# Patient Record
Sex: Female | Born: 1945 | Race: White | Hispanic: No | Smoking: Former smoker
Health system: Southern US, Community
[De-identification: ages and names within clinical notes are randomized; demographics above are authoritative.]

## PROBLEM LIST (undated history)

## (undated) DIAGNOSIS — Z79899 Other long term (current) drug therapy: Secondary | ICD-10-CM

## (undated) DIAGNOSIS — E119 Type 2 diabetes mellitus without complications: Secondary | ICD-10-CM

## (undated) DIAGNOSIS — R1319 Other dysphagia: Secondary | ICD-10-CM

## (undated) DIAGNOSIS — J189 Pneumonia, unspecified organism: Secondary | ICD-10-CM

## (undated) DIAGNOSIS — E785 Hyperlipidemia, unspecified: Secondary | ICD-10-CM

## (undated) DIAGNOSIS — Z8489 Family history of other specified conditions: Secondary | ICD-10-CM

## (undated) DIAGNOSIS — K219 Gastro-esophageal reflux disease without esophagitis: Secondary | ICD-10-CM

## (undated) DIAGNOSIS — I2699 Other pulmonary embolism without acute cor pulmonale: Secondary | ICD-10-CM

## (undated) DIAGNOSIS — Z7969 Long term (current) use of other immunomodulators and immunosuppressants: Secondary | ICD-10-CM

## (undated) DIAGNOSIS — I10 Essential (primary) hypertension: Secondary | ICD-10-CM

## (undated) DIAGNOSIS — C801 Malignant (primary) neoplasm, unspecified: Secondary | ICD-10-CM

## (undated) DIAGNOSIS — Z9289 Personal history of other medical treatment: Secondary | ICD-10-CM

## (undated) DIAGNOSIS — Z789 Other specified health status: Secondary | ICD-10-CM

## (undated) DIAGNOSIS — N189 Chronic kidney disease, unspecified: Secondary | ICD-10-CM

## (undated) HISTORY — PX: BACK SURGERY: SHX140

## (undated) HISTORY — PX: APPENDECTOMY: SHX54

## (undated) HISTORY — PX: ABDOMINAL HYSTERECTOMY: SHX81

## (undated) HISTORY — DX: Malignant (primary) neoplasm, unspecified: C80.1

## (undated) HISTORY — PX: CATARACT EXTRACTION: SUR2

## (undated) HISTORY — DX: Chronic kidney disease, unspecified: N18.9

## (undated) HISTORY — DX: Other pulmonary embolism without acute cor pulmonale: I26.99

## (undated) HISTORY — DX: Other specified health status: Z78.9

## (undated) HISTORY — DX: Essential (primary) hypertension: I10

## (undated) HISTORY — PX: SPINE SURGERY: SHX786

## (undated) HISTORY — PX: TUBAL LIGATION: SHX77

## (undated) HISTORY — DX: Long term (current) use of other immunomodulators and immunosuppressants: Z79.69

## (undated) HISTORY — DX: Other dysphagia: R13.19

## (undated) HISTORY — PX: LUMBAR FUSION: SHX111

## (undated) HISTORY — DX: Other long term (current) drug therapy: Z79.899

---

## 1998-05-25 ENCOUNTER — Ambulatory Visit (HOSPITAL_BASED_OUTPATIENT_CLINIC_OR_DEPARTMENT_OTHER): Admission: RE | Admit: 1998-05-25 | Discharge: 1998-05-25 | Payer: Self-pay | Admitting: Orthopedic Surgery

## 1998-06-15 ENCOUNTER — Ambulatory Visit (HOSPITAL_BASED_OUTPATIENT_CLINIC_OR_DEPARTMENT_OTHER): Admission: RE | Admit: 1998-06-15 | Discharge: 1998-06-15 | Payer: Self-pay | Admitting: Orthopedic Surgery

## 1998-08-15 ENCOUNTER — Ambulatory Visit (HOSPITAL_BASED_OUTPATIENT_CLINIC_OR_DEPARTMENT_OTHER): Admission: RE | Admit: 1998-08-15 | Discharge: 1998-08-15 | Payer: Self-pay | Admitting: Orthopedic Surgery

## 2014-03-30 DIAGNOSIS — C801 Malignant (primary) neoplasm, unspecified: Secondary | ICD-10-CM

## 2014-03-30 HISTORY — DX: Malignant (primary) neoplasm, unspecified: C80.1

## 2014-04-07 ENCOUNTER — Telehealth: Payer: Self-pay

## 2014-04-07 ENCOUNTER — Other Ambulatory Visit: Payer: Self-pay

## 2014-04-07 DIAGNOSIS — C159 Malignant neoplasm of esophagus, unspecified: Secondary | ICD-10-CM

## 2014-04-07 NOTE — Telephone Encounter (Signed)
EUS scheduled, pt instructed and medications reviewed.  Patient instructions mailed to home.  Patient to call with any questions or concerns.  

## 2014-04-08 ENCOUNTER — Encounter (HOSPITAL_COMMUNITY)
Admission: RE | Disposition: A | Discharge status: Home / Self Care | Payer: Self-pay | Source: Ambulatory Visit | Attending: Gastroenterology

## 2014-04-08 ENCOUNTER — Ambulatory Visit (HOSPITAL_COMMUNITY): Payer: Commercial Managed Care - PPO | Admitting: *Deleted

## 2014-04-08 ENCOUNTER — Encounter (HOSPITAL_COMMUNITY): Payer: Self-pay

## 2014-04-08 ENCOUNTER — Ambulatory Visit (HOSPITAL_COMMUNITY)
Admission: RE | Admit: 2014-04-08 | Discharge: 2014-04-08 | Disposition: A | Discharge status: Home / Self Care | Payer: Commercial Managed Care - PPO | Source: Ambulatory Visit | Attending: Gastroenterology | Admitting: Gastroenterology

## 2014-04-08 ENCOUNTER — Encounter (HOSPITAL_COMMUNITY): Payer: Commercial Managed Care - PPO | Admitting: *Deleted

## 2014-04-08 DIAGNOSIS — E109 Type 1 diabetes mellitus without complications: Secondary | ICD-10-CM | POA: Insufficient documentation

## 2014-04-08 DIAGNOSIS — R1310 Dysphagia, unspecified: Secondary | ICD-10-CM | POA: Insufficient documentation

## 2014-04-08 DIAGNOSIS — C159 Malignant neoplasm of esophagus, unspecified: Secondary | ICD-10-CM

## 2014-04-08 DIAGNOSIS — Z79899 Other long term (current) drug therapy: Secondary | ICD-10-CM | POA: Insufficient documentation

## 2014-04-08 DIAGNOSIS — F17200 Nicotine dependence, unspecified, uncomplicated: Secondary | ICD-10-CM | POA: Insufficient documentation

## 2014-04-08 DIAGNOSIS — R599 Enlarged lymph nodes, unspecified: Secondary | ICD-10-CM | POA: Insufficient documentation

## 2014-04-08 DIAGNOSIS — I10 Essential (primary) hypertension: Secondary | ICD-10-CM | POA: Insufficient documentation

## 2014-04-08 DIAGNOSIS — C160 Malignant neoplasm of cardia: Secondary | ICD-10-CM | POA: Insufficient documentation

## 2014-04-08 HISTORY — DX: Type 2 diabetes mellitus without complications: E11.9

## 2014-04-08 HISTORY — PX: EUS: SHX5427

## 2014-04-08 LAB — GLUCOSE, CAPILLARY: GLUCOSE-CAPILLARY: 149 mg/dL — AB (ref 70–99)

## 2014-04-08 SURGERY — ESOPHAGEAL ENDOSCOPIC ULTRASOUND (EUS) RADIAL
Anesthesia: Monitor Anesthesia Care

## 2014-04-08 MED ORDER — PROPOFOL INFUSION 10 MG/ML OPTIME
INTRAVENOUS | Status: DC | PRN
  Administered 2014-04-08: 140 ug/kg/min via INTRAVENOUS

## 2014-04-08 MED ORDER — LIDOCAINE HCL 1 % IJ SOLN
INTRAMUSCULAR | Status: DC | PRN
  Administered 2014-04-08: 50 mg via INTRADERMAL

## 2014-04-08 MED ORDER — BUTAMBEN-TETRACAINE-BENZOCAINE 2-2-14 % EX AERO
INHALATION_SPRAY | CUTANEOUS | Status: DC | PRN
  Administered 2014-04-08: 1 via TOPICAL

## 2014-04-08 MED ORDER — MIDAZOLAM HCL 5 MG/5ML IJ SOLN
INTRAMUSCULAR | Status: DC | PRN
  Administered 2014-04-08: 1 mg via INTRAVENOUS

## 2014-04-08 MED ORDER — LACTATED RINGERS IV SOLN
INTRAVENOUS | Status: DC | PRN
  Administered 2014-04-08: 08:00:00 via INTRAVENOUS

## 2014-04-08 MED ORDER — LACTATED RINGERS IV SOLN
INTRAVENOUS | Status: DC
  Administered 2014-04-08: 1000 mL via INTRAVENOUS

## 2014-04-08 MED ORDER — SODIUM CHLORIDE 0.9 % IV SOLN: INTRAVENOUS | Status: DC

## 2014-04-08 MED ORDER — KETAMINE HCL 10 MG/ML IJ SOLN
INTRAMUSCULAR | Status: AC
  Filled 2014-04-08: qty 1

## 2014-04-08 MED ORDER — PROPOFOL 10 MG/ML IV BOLUS
INTRAVENOUS | Status: AC
  Filled 2014-04-08: qty 20

## 2014-04-08 MED ORDER — LIDOCAINE HCL (CARDIAC) 20 MG/ML IV SOLN
INTRAVENOUS | Status: AC
  Filled 2014-04-08: qty 5

## 2014-04-08 MED ORDER — KETAMINE HCL 10 MG/ML IJ SOLN
INTRAMUSCULAR | Status: DC | PRN
  Administered 2014-04-08 (×2): 10 mg via INTRAVENOUS

## 2014-04-08 MED ORDER — MIDAZOLAM HCL 2 MG/2ML IJ SOLN
INTRAMUSCULAR | Status: AC
  Filled 2014-04-08: qty 2

## 2014-04-08 NOTE — Discharge Instructions (Signed)
YOU HAD AN ENDOSCOPIC PROCEDURE TODAY: Refer to the procedure report that was given to you for any specific questions about what was found during the examination.  If the procedure report does not answer your questions, please call your gastroenterologist to clarify.  YOU SHOULD EXPECT: Some feelings of bloating in the abdomen. Passage of more gas than usual.  Walking can help get rid of the air that was put into your GI tract during the procedure and reduce the bloating. If you had a lower endoscopy (such as a colonoscopy or flexible sigmoidoscopy) you may notice spotting of blood in your stool or on the toilet paper.   DIET: Your first meal following the procedure should be a light meal and then it is ok to progress to your normal diet.  A half-sandwich or bowl of soup is an example of a good first meal.  Heavy or fried foods are harder to digest and may make you feel nasueas or bloated.  Drink plenty of fluids but you should avoid alcoholic beverages for 24 hours.  ACTIVITY: Your care partner should take you home directly after the procedure.  You should plan to take it easy, moving slowly for the rest of the day.  You can resume normal activity the day after the procedure however you should NOT DRIVE or use heavy machinery for 24 hours (because of the sedation medicines used during the test).    SYMPTOMS TO REPORT IMMEDIATELY  A gastroenterologist can be reached at any hour.  Please call your doctor's office for any of the following symptoms:   Following lower endoscopy (colonoscopy, flexible sigmoidoscopy)  Excessive amounts of blood in the stool  Significant tenderness, worsening of abdominal pains  Swelling of the abdomen that is new, acute  Fever of 100 or higher  Following upper endoscopy (EGD, EUS, ERCP)  Vomiting of blood or coffee ground material  New, significant abdominal pain  New, significant chest pain or pain under the shoulder blades  Painful or persistently difficult  swallowing  New shortness of breath  Black, tarry-looking stools  FOLLOW UP: If any biopsies were taken you will be contacted by phone or by letter within the next 1-3 weeks.  Call your gastroenterologist if you have not heard about the biopsies in 3 weeks.  Please also call your gastroenterologist's office with any specific questions about appointments or follow up tests.      Esophagogastroduodenoscopy Care After Refer to this sheet in the next few weeks. These instructions provide you with information on caring for yourself after your procedure. Your caregiver may also give you more specific instructions. Your treatment has been planned according to current medical practices, but problems sometimes occur. Call your caregiver if you have any problems or questions after your procedure.  HOME CARE INSTRUCTIONS  Do not eat or drink anything until the numbing medicine (local anesthetic) has worn off and your gag reflex has returned. You will know that the local anesthetic has worn off when you can swallow comfortably.  Do not drive for 12 hours after the procedure or as directed by your caregiver.  Only take medicines as directed by your caregiver. SEEK MEDICAL CARE IF:   You cannot stop coughing.  You are not urinating at all or less than usual. SEEK IMMEDIATE MEDICAL CARE IF:  You have difficulty swallowing.  You cannot eat or drink.  You have worsening throat or chest pain.  You have dizziness, lightheadedness, or you faint.  You have nausea or vomiting.  You have chills.  You have a fever.  You have severe abdominal pain.  You have black, tarry, or bloody stools. Document Released: 10/01/2012 Document Reviewed: 10/01/2012 [facility] Patient Information 2014 [place], [place].

## 2014-04-08 NOTE — Transfer of Care (Signed)
Immediate Anesthesia Transfer of Care Note  Patient: Sharon Price  Procedure(s) Performed: Procedure(s): ESOPHAGEAL ENDOSCOPIC ULTRASOUND (EUS) RADIAL (N/A)  Patient Location: PACU and Endoscopy Unit  Anesthesia Type:MAC  Level of Consciousness: awake, alert , oriented and patient cooperative  Airway & Oxygen Therapy: Patient Spontanous Breathing and Patient connected to nasal cannula oxygen  Post-op Assessment: Report given to PACU RN, Post -op Vital signs reviewed and stable and Patient moving all extremities  Post vital signs: Reviewed and stable  Complications: No apparent anesthesia complications

## 2014-04-08 NOTE — H&P (Signed)
HPI: This is a very pleasant woman who has lost 40 opunds in past year, + dysphagia. She had EGD Dr. 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  CT scan chest, abd, pelvis has shown no clear metastatic disease. She is here for EUS staging.    Past Medical History  Diagnosis Date  . Diabetes mellitus without complication     Past Surgical History  Procedure Laterality Date  . Back surgery    . Abdominal hysterectomy    . Tubal ligation      Current Facility-Administered Medications  Medication Dose Route Frequency Provider Last Rate Last Dose  . 0.9 %  sodium chloride infusion   Intravenous Continuous Milus Banister, MD      . lactated ringers infusion   Intravenous Continuous Milus Banister, MD 125 mL/hr at 04/08/14 0827 1,000 mL at 04/08/14 0827    Allergies as of 04/07/2014  . (Not on File)    History reviewed. No pertinent family history.  History   Social History  . Marital Status: Widowed    Spouse Name: N/A    Number of Children: N/A  . Years of Education: N/A   Occupational History  . Not on file.   Social History Main Topics  . Smoking status: Light Tobacco Smoker  . Smokeless tobacco: Not on file  . Alcohol Use: No  . Drug Use: No  . Sexual Activity: Not on file   Other Topics Concern  . Not on file   Social History Narrative  . No narrative on file      Physical Exam: BP 140/71  Pulse 73  Temp(Src) 97.7 F (36.5 C) (Oral)  Resp 19  Ht 5\' 2"  (1.575 m)  Wt 163 lb (73.936 kg)  BMI 29.81 kg/m2  SpO2 98% Constitutional: generally well-appearing Psychiatric: alert and oriented x3 Abdomen: soft, nontender, nondistended, no obvious ascites, no peritoneal signs, normal bowel sounds     Assessment and plan: 68 y.o. female with newly diagnosed esophageal adenocarcnioma  For EUS staging today

## 2014-04-08 NOTE — Anesthesia Postprocedure Evaluation (Signed)
Anesthesia Post Note  Patient: Sharon Price  Procedure(s) Performed: Procedure(s) (LRB): ESOPHAGEAL ENDOSCOPIC ULTRASOUND (EUS) RADIAL (N/A)  Anesthesia type: MAC  Patient location: PACU  Post pain: Pain level controlled  Post assessment: Post-op Vital signs reviewed  Last Vitals:  Filed Vitals:   04/08/14 0940  BP: 172/81  Pulse: 69  Temp:   Resp: 15    Post vital signs: Reviewed  Level of consciousness: sedated  Complications: No apparent anesthesia complications

## 2014-04-08 NOTE — Op Note (Addendum)
[facility] [place], [address]   ENDOSCOPIC ULTRASOUND PROCEDURE REPORT  PATIENT: Sharon Price, Sharon Price  MR#: [id number] BIRTHDATE: 08/28/1946  GENDER: Female ENDOSCOPIST: Milus Banister, MD REFERRED BY:  Kyra Leyland, M.D. PROCEDURE DATE:  04/08/2014 PROCEDURE:   Upper EUS ASA CLASS:      Class III INDICATIONS:   recently diagnosed distal esophagus adenocarcinoma (Dr.  Lyda Jester EGD last week): CT scans report no obvious metastatic disease. MEDICATIONS: MAC sedation, administered by CRNA  DESCRIPTION OF PROCEDURE:   After the risks benefits and alternatives of the procedure were  explained, informed consent was obtained. The patient was then placed in the left, lateral, decubitus postion and IV sedation was administered. Throughout the procedure, the patients blood pressure, pulse and oxygen saturations were monitored continuously.  Under direct visualization, the Pentax Radial EUS [id number]  endoscope was introduced through the mouth  and advanced to the stomach body . Water was used as necessary to provide an acoustic interface.  Upon completion of the imaging, water was removed and the patient was sent to the recovery room in satisfactory condition.  Endoscopic findings: 1. Malignant mass in distal esophagus. The mass was circumferential, patially obstructing but I was able to fairly easily advance echoendoscope through the strictured lumen.  The proximal edge was 34 cm from the incisors and distal edge (just below the GE junction) was at 39 cm. The mass is 5cm long.  EUS findings: 1. The mass above corresponded with a hypoechoic, heterogeneous mass that clearly passes into and through the muscularis propria layer of the esophageal wall (uT3). 2. There were two round, well demarcated, 5-50mm, hypoechoic paraesophageal lymphnodes that lay directly adjacent to the mass that are suspicious for malignant involvement (uN1). 3. No celiac  adenopathy.  Impression: uT3N1 (clinical stage IIIa) 5cm long, cirumferential GE junction adenocarcinoma with proximal edge at 34cm from incisors and distal edge just below the GE junction.  She will likely benefit from neoadjuvant chemo/XRT.   _______________________________ eSignedMilus Banister, MD 04/08/2014 9:26 AM Revised: 04/08/2014 9:26 AM

## 2014-04-08 NOTE — Anesthesia Preprocedure Evaluation (Addendum)
Anesthesia Evaluation  Patient identified by MRN, date of birth, ID band Patient awake    Reviewed: Allergy & Precautions, H&P , NPO status , Patient's Chart, lab work & pertinent test results  Airway Mallampati: II TM Distance: >3 FB Neck ROM: Full    Dental  (+) Edentulous Upper, Edentulous Lower   Pulmonary Current Smoker,  breath sounds clear to auscultation  Pulmonary exam normal       Cardiovascular hypertension, Pt. on medications Rhythm:Regular Rate:Normal     Neuro/Psych negative neurological ROS  negative psych ROS   GI/Hepatic negative GI ROS, Neg liver ROS, Esophageal CA   Endo/Other  diabetes, Type 1, Insulin Dependent, Oral Hypoglycemic Agents  Renal/GU negative Renal ROS  negative genitourinary   Musculoskeletal negative musculoskeletal ROS (+)   Abdominal   Peds  Hematology negative hematology ROS (+)   Anesthesia Other Findings   Reproductive/Obstetrics                          Anesthesia Physical Anesthesia Plan  ASA: III  Anesthesia Plan: MAC   Post-op Pain Management:    Induction: Intravenous  Airway Management Planned: Nasal Cannula  Additional Equipment:   Intra-op Plan:   Post-operative Plan:   Informed Consent: I have reviewed the patients History and Physical, chart, labs and discussed the procedure including the risks, benefits and alternatives for the proposed anesthesia with the patient or authorized representative who has indicated his/her understanding and acceptance.   Dental advisory given  Plan Discussed with: CRNA  Anesthesia Plan Comments:         Anesthesia Quick Evaluation

## 2014-04-09 ENCOUNTER — Encounter (HOSPITAL_COMMUNITY): Payer: Self-pay | Admitting: Gastroenterology

## 2014-08-09 ENCOUNTER — Encounter: Payer: Self-pay | Admitting: *Deleted

## 2014-08-09 DIAGNOSIS — Z789 Other specified health status: Secondary | ICD-10-CM | POA: Insufficient documentation

## 2014-08-09 DIAGNOSIS — I10 Essential (primary) hypertension: Secondary | ICD-10-CM | POA: Insufficient documentation

## 2014-08-09 DIAGNOSIS — I2699 Other pulmonary embolism without acute cor pulmonale: Secondary | ICD-10-CM | POA: Insufficient documentation

## 2014-08-09 DIAGNOSIS — Z79899 Other long term (current) drug therapy: Secondary | ICD-10-CM

## 2014-08-09 DIAGNOSIS — N189 Chronic kidney disease, unspecified: Secondary | ICD-10-CM | POA: Insufficient documentation

## 2014-08-09 DIAGNOSIS — R1319 Other dysphagia: Secondary | ICD-10-CM | POA: Insufficient documentation

## 2014-08-10 ENCOUNTER — Encounter: Payer: Self-pay | Admitting: Cardiothoracic Surgery

## 2014-08-10 ENCOUNTER — Institutional Professional Consult (permissible substitution) (INDEPENDENT_AMBULATORY_CARE_PROVIDER_SITE_OTHER): Payer: Commercial Managed Care - PPO | Admitting: Cardiothoracic Surgery

## 2014-08-10 VITALS — BP 133/89 | HR 120 | Ht 63.0 in | Wt 136.0 lb

## 2014-08-10 DIAGNOSIS — C159 Malignant neoplasm of esophagus, unspecified: Secondary | ICD-10-CM

## 2014-08-10 NOTE — Progress Notes (Signed)
[address]       [place], [address]             [place] Record #[id number] Date of Birth: 1946/03/20  Referring: Marice Potter, MD Primary Care: Lillard Anes, MD  Chief Complaint:    Chief Complaint  Patient presents with  . NEW THORACIC    ESOPHAGEAL CA/MAF    History of Present Illness:    BERONICA LANSDALE 68 y.o. 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 The patient has completed neoadjuvant chemotherapy radiation with weekly carboplatin and and paclltaxel. Her treatment course has been complicated with severe dehydration poor nutrition, requiring a PEG tube to be placed which has become infected and had to be replaced several times. She is able to take some by mouth diet but limited. She continues to smoke.   Patient is noted on recent CT scan 07/21/2014 to have evidence of pulmonary emboli, she is now on Lovenox to be converted to xarelto  Current Activity/ Functional Status:  Patient is independent with mobility/ambulation, transfers, ADL's, IADL's.   Zubrod Score: At the time of surgery this patient's most appropriate activity status/level should be described as: []     0    Normal activity, no symptoms []     1    Restricted in physical strenuous activity but ambulatory, able to do out light work []     2    Ambulatory and capable of self care, unable to do work activities, up and about               >50 % of waking hours                              [x]     3    Only limited self care, in bed greater than 50% of waking hours []     4    Completely disabled, no self care, confined to bed or chair []     5    Moribund   Past Medical History  Diagnosis Date  . Diabetes mellitus without complication   . Chronic kidney disease     renal..STAGE 2  . Hypertension   . Mechanical dysphagia      MOSTLY SOLIDS  . Pulmonary emboli 9/223/15 [place]    RIGHT LOWER LOBE PER CT CHEST   . Patient on combined chemotherapy and radiation     [facility]  . Cancer 03/30/14    GE JUNCTION    Past Surgical History  Procedure Laterality Date  . Back surgery    . Abdominal hysterectomy    . Tubal ligation    . Eus N/A 04/08/2014    Procedure: ESOPHAGEAL ENDOSCOPIC ULTRASOUND (EUS) RADIAL;  Surgeon: Milus Banister, MD;  Location: [facility];  Service: Endoscopy;  Laterality: N/A;  . Appendectomy    . Spine surgery    . Lumbar fusion      Family History  Problem Relation Age of Onset  . Diabetes Mother   . Hypertension Mother   . Stroke Mother   . Heart disease Mother   . Hypertension Father   . Diabetes Sister   .  Cancer Sister     THYROID  . Cancer Brother     LUNG  . Diabetes Daughter   . Diabetes Son     History   Social History  . Marital Status: Widowed    Spouse Name: N/A    Number of Children: N/A  . Years of Education: N/A   Occupational History  . Not on file.   Social History Main Topics  . Smoking status: Light Tobacco Smoker  . Smokeless tobacco: Not on file  . Alcohol Use: No  . Drug Use: No  . Sexual Activity: Not on file   Other Topics Concern  . Not on file   Social History Narrative  . No narrative on file    History  Smoking status  . Light Tobacco Smoker  Smokeless tobacco  . Not on file    History  Alcohol Use No     No Known Allergies  Current Outpatient Prescriptions  Medication Sig Dispense Refill  . Docusate Sodium (COLACE PO) Take by mouth.      . enoxaparin (LOVENOX) 60 MG/0.6ML injection Inject 60 mg into the skin 2 (two) times daily.      . insulin detemir (LEVEMIR) 100 UNIT/ML injection Inject 50-75 Units into the skin at bedtime. 75 units in the am 50 units at bedtime      . lisinopril (PRINIVIL,ZESTRIL) 40 MG tablet Take 40 mg by mouth daily.      Mar[facility] Kitchen LORazepam  (ATIVAN) 0.5 MG tablet Take 0.5 mg by mouth.      . megestrol (MEGACE) 400 MG/10ML suspension Take 400 mg by mouth daily.      . metoCLOPramide (REGLAN) 10 MG tablet Take 10 mg by mouth 4 (four) times daily.      Marland Kitchen omeprazole (PRILOSEC) 20 MG capsule Take 20 mg by mouth 2 (two) times daily before a meal.      . sucralfate (CARAFATE) 1 G tablet Take 1 g by mouth 4 (four) times daily.      . Canagliflozin (INVOKANA) 100 MG TABS Take 100 mg by mouth daily.      Marland Kitchen gabapentin (NEURONTIN) 300 MG capsule Take 300 mg by mouth 4 (four) times daily.      Marland Kitchen HYDROcodone-acetaminophen (NORCO/VICODIN) 5-325 MG per tablet Take 1 tablet by mouth every 4 (four) hours as needed for moderate pain.      Marland Kitchen ondansetron (ZOFRAN) 4 MG tablet Take 4 mg by mouth every 6 (six) hours as needed for nausea or vomiting.      . pravastatin (PRAVACHOL) 20 MG tablet Take 20 mg by mouth every morning.      . rivaroxaban (XARELTO) 20 MG TABS tablet Take 20 mg by mouth daily with supper.       No current facility-administered medications for this visit.     Review of Systems:     Cardiac Review of Systems: Y or N  Chest Pain [  y  ]  Resting SOB [ n  ] Exertional SOB  [ y ]  Orthopnea [  ]   Pedal Edema [   ]    Palpitations [  ] Syncope  [  ]   Presyncope [   ]  General Review of Systems: [Y] = yes [  ]=no Constitional: recent weight change [  ];  Wt loss over the last 3 months [  40 lbs ] anorexia [  ]; fatigue [ y ]; nausea [ y ]; night sweats [  ];  fever [  ]; or chills [  ];          Dental: poor dentition[  ]; Last Dentist visit:   Eye : blurred vision [  ]; diplopia [   ]; vision changes [  ];  Amaurosis fugax[  ]; Resp: cough [  ];  wheezing[  ];  hemoptysis[  ]; shortness of breath[  ]; paroxysmal nocturnal dyspnea[  ]; dyspnea on exertion[ y ]; or orthopnea[  ];  GI:  gallstones[  ], vomiting[  ];  dysphagia[y  ]; melena[  ];  hematochezia [ y ]; heartburn[ y ];   Hx of  Colonoscopy[  ]; GU: kidney stones [  ];  hematuria[  ];   dysuria [  ];  nocturia[  ];  history of     obstruction [  ]; urinary frequency [  ]             Skin: rash, swelling[  ];, hair loss[  ];  peripheral edema[  ];  or itching[  ]; Musculosketetal: myalgias[  ];  joint swelling[  ];  joint erythema[  ];  joint pain[  ];  back pain[  ];  Heme/Lymph: bruising[  ];  bleeding[  ];  anemia[  ];  Neuro: TIA[  ];  headaches[  ];  stroke[  ];  vertigo[  ];  seizures[  ];   paresthesias[  ];  difficulty walking[ y ];  Psych:depression[  ]; anxiety[ y ];  Endocrine: diabetes[  ];  thyroid dysfunction[  ];  Immunizations: Flu up to date [ n ]; Pneumococcal up to date Florencio.Farrier  ];  Other:  Physical Exam: BP 133/89  Pulse 120  Ht 5' 3"  (1.6 m)  Wt 136 lb (61.689 kg)  BMI 24.10 kg/m2  SpO2 94%  PHYSICAL EXAMINATION:  General appearance: alert, cooperative, appears older than stated age, cachectic and fatigued Neurologic: intact Heart: irregularly irregular rhythm Lungs: diminished breath sounds bibasilar Abdomen: Abdomen mildly distended with intact G-tube in the left upper abdomen Extremities: extremities normal, atraumatic, no cyanosis or edema and Homans sign is negative, no sign of DVT Wound: There appears to be no infection around the I do not appreciate cervical or supraclavicular adenopathy   Diagnostic Studies & Laboratory data:     Recent Radiology Findings:  CLINICAL DATA: Shortness of breath; history of esophageal malignancy, PEG tube placement, appendectomy, hysterectomy, and lumbar fusion.  EXAM: CT ANGIOGRAPHY CHEST  CT ABDOMEN AND PELVIS WITH CONTRAST  TECHNIQUE: Multidetector CT imaging of the chest was performed using the standard protocol during bolus administration of intravenous contrast. Multiplanar CT image reconstructions and MIPs were obtained to evaluate the vascular anatomy. Multidetector CT imaging of the abdomen and pelvis was performed using the standard protocol during bolus administration of  intravenous contrast.  CONTRAST: 100 cc of Isovue 370 intravenously. The patient also received oral contrast material through the PEG tube.  COMPARISON: Portable chest x-ray of today's date and PET-CT study of April 21, 2014 and CT scan of the chest and abdomen abdomen dated March 31, 2014  FINDINGS: CTA CHEST FINDINGS  There are small filling defects within peripheral pulmonary arterial branches to the right lower lobe. There are no filling defects elsewhere on the right nor on the left. The caliber of the thoracic aorta is normal. The cardiac chambers are normal in size. The RV LV ratio is approximately 1. There is no pericardial effusion. There is no lymphadenopathy. There is thickening of the wall  of the lower 1/3 of the esophagus without visible discrete mass. There is no free mediastinal fluid or air.  At lung window settings there is no pulmonary parenchymal mass. There is increased density in the posterior medial aspect of the right costophrenic gutter which may reflect pneumonia or pulmonary infarction new since the previous studies. There is no pleural effusion nor pulmonary parenchymal mass.  There are degenerative changes of the lower thoracic discs. There is no compression fracture. The sternum and observed ribs exhibit no acute abnormalities.  CT ABDOMEN and PELVIS FINDINGS  The liver, gallbladder, pancreas, spleen, right adrenal gland, and kidneys are normal. There is stable mild enlargement of the left adrenal gland. There is no bulky periaortic or pericaval lymphadenopathy. The abdominal aorta exhibits mural thrombus and calcified plaque, but no evidence of aneurysm. The stomach contains a PEG tube and is of partially distended with gas. The GE junction region remains prominent. No perigastric lymphadenopathy is demonstrated. The small bowel is normal. There is a moderate stool burden within the colon without evidence of obstruction. The rectum is mildly  distended with gas and stool. The urinary bladder is unremarkable. The uterus is surgically absent. The patient has undergone previous posterior fusion at L3-4 and L4-5. The vertebral bodies are preserved in height. The bony pelvis is unremarkable.  Review of the MIP images confirms the above findings.  IMPRESSION: 1. There are small emboli within branches of the right lower lobe pulmonary artery posteriorly without evidence of significant right heart strain. There is small amount of adjacent presumed infarct versus pneumonia. 2. There is mild thickening of the wall of the distal third of the esophagus without evidence of a discrete mass or perforation. 3. There is no evidence of CHF nor pulmonary parenchymal masses. There is no pleural effusion. 4. No acute intra-abdominal abnormality is demonstrated. There are atherosclerotic changes of the abdominal aorta without evidence of dissection or aneurysm. There is stable mild enlargement of the left adrenal gland. 5. These results were called by telephone at the time of interpretation on 07/21/2014 at 12:59 pm to Dr. Isla Pence MD, who verbally acknowledged these results.   Electronically Signed By: David Martinique On: 07/21/2014 12:59   CLINICAL DATA: Initial treatment strategy for esophageal carcinoma.  EXAM: NUCLEAR MEDICINE PET SKULL BASE TO THIGH  TECHNIQUE: 11.0 mCi F-18 FDG was injected intravenously. Full-ring PET imaging was performed from the skull base to thigh after the radiotracer. CT data was obtained and used for attenuation correction and anatomic localization.  FASTING BLOOD GLUCOSE: Value: 73 mg/dl  COMPARISON: CT 03/31/2014  FINDINGS: NECK  No hypermetabolic lymph nodes in the neck.  CHEST  There is a long segment of intense hypermetabolic activity associated with the esophagus. This extends from relate just superior to the carina through the GE junction with SUV max equal 8.5. There is a  hypermetabolic mass at the esophageal junction extending to the gastric cardia with SUV max equals 17.3. No hypermetabolic mediastinal lymph nodes. No suspicious pulmonary nodules.  ABDOMEN/PELVIS  Hypermetabolic mass in the gastric cardia described above. There is no hypermetabolic gastro hepatic ligament lymph nodes. No abnormal metabolic activity within the liver.  No hypermetabolic abdominal pelvic lymph nodes  SKELETON  No focal hypermetabolic activity to suggest skeletal metastasis.  IMPRESSION: 1. Hypermetabolic mass in the gastric cardia and esophageal junction consists with primary esophageal / gastric carcinoma. 2. Long segment intense metabolic activity associated with the mid and distal esophagus. Differential includes esophageal carcinoma versus esophagitis. 3. No  evidence of hypermetabolic mediastinal or gastrohepatic ligament lymph nodes. 4. No evidence of liver metastasis. 5. No distant metastasis   Electronically Signed By: Suzy Bouchard M.D. On: 04/21/2014 13:57    CLINICAL DATA: Difficulty swallowing and epigastric pain. Esophageal disorder. Prior appendectomy and hysterectomy.  EXAM: CT CHEST AND ABDOMEN WITH CONTRAST  TECHNIQUE: Multidetector CT imaging of the chest and abdomen was performed following the standard protocol during bolus administration of intravenous contrast.  CONTRAST: 80 cc Isovue 370  COMPARISON: Abdominal pelvic CT 02/10/2014. Chest radiograph 04/07/2013. Chest CT 04/23/2008.  FINDINGS: CT CHEST FINDINGS  Lungs/Pleura: No nodules or airspace opacities. No pleural fluid.  Heart/Mediastinum: No supraclavicular adenopathy. Mildly age advanced aortic and branch vessel atherosclerosis. Normal heart size with lipomatous hypertrophy of the interatrial septum. Multivessel coronary artery atherosclerosis. No mediastinal or hilar adenopathy. Air contrast level in the thoracic esophagus on image 28 of series 2 and more  superiorly on image 19/series 2.  CT ABDOMEN FINDINGS  Abdomen: Normal liver, spleen. Redemonstration of soft tissue fullness involving the gastroesophageal junction and proximal stomach. Example images 37-42 of series 2. Suspect fluid/gastric contents with soft tissue density in the gastric cardia/body junction on image 39/series 2.  Normal pancreas, gallbladder, biliary tract. Mild right adrenal thickening and left adrenal nodularity are grossly similar back to 2009. Suspect an underlying left adrenal adenoma at 1.3 cm. 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 Normal right kidney. Advanced aortic and branch vessel atherosclerosis. Ulcerative plaque within the infrarenal aorta, including on image 62/series 2. Beam hardening artifact from spinal hardware. No retroperitoneal or retrocrural adenopathy. Normal colon and terminal ileum. Normal abdominal small bowel without ascites. No evidence of omental or peritoneal disease.  Bones/Musculoskeletal: L3-5 lumbar spine fixation. Lower thoracic degenerative disc disease.  IMPRESSION: CT CHEST IMPRESSION  1. No acute process in the chest. 2. Age advanced coronary artery atherosclerosis. Recommend assessment of coronary risk factors and consideration of medical therapy. 3. Mildly dilated thoracic esophagus with contrast within. This suggests a component of esophageal obstruction, dysmotility, or gastroesophageal reflux disease.  CT ABDOMEN AND PELVIS IMPRESSION  1. Redemonstration of soft tissue fullness at the distal esophagus and proximal stomach. Cannot exclude gastritis or gastroesophageal carcinoma. If not already performed, endoscopy is recommended. 2. Advanced abdominal aortic and branch vessel atherosclerosis.   Electronically Signed By: Abigail Miyamoto M.D. On: 03/31/2014 14:14   Pretreatment EUS: Endoscopic findings: 1. Malignant mass in distal esophagus. The mass was circumferential, patially  obstructing but I was able to fairly easily advance echoendoscope through the strictured lumen. The proximal edge was 34 cm from the incisors and distal edge (just below the GE junction) was at 39 cm. The mass is 5cm long. EUS findings: 1. The mass above corresponded with a hypoechoic, heterogeneous mass that clearly passes into and through the muscularis propria layer of the esophageal wall (uT3). 2. There were two round, well demarcated, 5-92m, hypoechoic paraesophageal lymphnodes that lay directly adjacent to the mass that are suspicious for malignant involvement (uN1). 3. No celiac adenopathy. Impression: uT3N1 (clinical stage IIIa) 5cm long, cirumferential GE junction adenocarcinoma with proximal edge at 34cm from incisors and distal edge just below the GE junction.  Recent Lab Findings: No results found for this basename: WBC,  HGB,  HCT,  PLT,  GLUCOSE,  CHOL,  TRIG,  HDL,  LDLDIRECT,  LDLCALC,  ALT,  AST,  NA,  K,  CL,  CREATININE,  BUN,  CO2,  TSH,  INR,  GLUF,  HGBA1C  Assessment / Plan:   Patient appears to be a frail 68 year old female with advanced stage esophageal cancer at least IIIa, recent CT scan shows evidence of pulmonary embolus but no evidence of widespread metastatic disease. Both her underlying health and difficulty tolerating previous therapy these her at this point being a very poor surgical candidate from a medical standpoint. If we were to proceed with esophagectomy she would require cardiology evaluation  improved nutritional status and functional status. I discussed this with her and her daughter. I am willing to see her back in one month and we will see how she was doing. I'm not hopeful that she will become a surgical candidate.      I spent 60 minutes counseling the patient face to face. The total time spent in the appointment was 80 minutes.  Grace Isaac MD      [place] [place], [address] Office [phone]   Beeper  [phone]  08/10/2014 3:16 PM

## 2014-09-16 ENCOUNTER — Encounter: Payer: Self-pay | Admitting: Cardiothoracic Surgery

## 2014-09-16 ENCOUNTER — Ambulatory Visit (INDEPENDENT_AMBULATORY_CARE_PROVIDER_SITE_OTHER): Payer: Commercial Managed Care - PPO | Admitting: Cardiothoracic Surgery

## 2014-09-16 VITALS — BP 115/76 | HR 96 | Resp 20 | Ht 63.0 in | Wt 151.0 lb

## 2014-09-16 DIAGNOSIS — C159 Malignant neoplasm of esophagus, unspecified: Secondary | ICD-10-CM

## 2014-09-16 NOTE — Progress Notes (Signed)
[address]       [place] [address]             [facility] Record #[id number] Date of Birth: Jun 11, 1946  Referring: Marice Potter, MD Primary Care: Lillard Anes, MD  Chief Complaint:    Chief Complaint  Patient presents with  . Routine Post Op    1 month f/u    History of Present Illness:    Sharon Price 68 y.o. 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 The patient has completed neoadjuvant chemotherapy radiation with weekly carboplatin and and paclltaxel in AUG 2015. Her treatment course has been complicated with severe dehydration poor nutrition, requiring a PEG tube to be placed which has become infected and had to be replaced several times. The patient has been receiving supplementary IV fluids through a PICC line. Overall she is improved since the last time I saw her, and will stop her IV fluid administration tomorrow. Patient is noted on recent CT scan 07/21/2014 to have evidence of pulmonary emboli, she is now on Lovenox to be converted to xarelto  Current Activity/ Functional Status:  Patient is independent with mobility/ambulation, transfers, ADL's, IADL's.   Zubrod Score: At the time of surgery this patient's most appropriate activity status/level should be described as: []     0    Normal activity, no symptoms []     1    Restricted in physical strenuous activity but ambulatory, able to do out light work []     2    Ambulatory and capable of self care, unable to do work activities, up and about               >50 % of waking hours                              [x]     3    Only limited self care, in bed greater than 50% of waking hours []     4    Completely disabled, no self care, confined to bed or chair []     5    Moribund   Past Medical History  Diagnosis Date  . Diabetes mellitus  without complication   . Chronic kidney disease     renal..STAGE 2  . Hypertension   . Mechanical dysphagia     MOSTLY SOLIDS  . Pulmonary emboli 9/223/15 Sharon Price    RIGHT LOWER LOBE PER CT CHEST   . Patient on combined chemotherapy and radiation     [facility]  . Cancer 03/30/14    GE JUNCTION    Past Surgical History  Procedure Laterality Date  . Back surgery    . Abdominal hysterectomy    . Tubal ligation    . Eus N/A 04/08/2014    Procedure: ESOPHAGEAL ENDOSCOPIC ULTRASOUND (EUS) RADIAL;  Surgeon: Milus Banister, MD;  Location: [facility] ENDOSCOPY;  Service: Endoscopy;  Laterality: N/A;  . Appendectomy    . Spine surgery    . Lumbar fusion      Family History  Problem Relation Age of Onset  . Diabetes Mother   . Hypertension Mother   .  Stroke Mother   . Heart disease Mother   . Hypertension Father   . Diabetes Sister   . Cancer Sister     THYROID  . Cancer Brother     LUNG  . Diabetes Daughter   . Diabetes Son     History   Social History  . Marital Status: Widowed    Spouse Name: N/A    Number of Children: N/A  . Years of Education: N/A   Occupational History  . Not on file.   Social History Main Topics  . Smoking status: Light Tobacco Smoker  . Smokeless tobacco: Not on file  . Alcohol Use: No  . Drug Use: No  . Sexual Activity: Not on file   Other Topics Concern  . Not on file   Social History Narrative    History  Smoking status  . Light Tobacco Smoker  Smokeless tobacco  . Not on file    History  Alcohol Use No     No Known Allergies  Current Outpatient Prescriptions  Medication Sig Dispense Refill  . Docusate Sodium (COLACE PO) Take by mouth.    . gabapentin (NEURONTIN) 300 MG capsule Take 300 mg by mouth 4 (four) times daily.    Marland Kitchen HYDROcodone-acetaminophen (NORCO/VICODIN) 5-325 MG per tablet Take 1 tablet by mouth every 4 (four) hours as needed for moderate pain.    Marland Kitchen lisinopril  (PRINIVIL,ZESTRIL) 40 MG tablet Take 40 mg by mouth daily.    Marland Kitchen LORazepam (ATIVAN) 0.5 MG tablet Take 0.5 mg by mouth.    . metoCLOPramide (REGLAN) 10 MG tablet Take 10 mg by mouth 4 (four) times daily.    Marland Kitchen omeprazole (PRILOSEC) 20 MG capsule Take 20 mg by mouth 2 (two) times daily before a meal.    . ondansetron (ZOFRAN) 4 MG tablet Take 4 mg by mouth every 6 (six) hours as needed for nausea or vomiting.    . pravastatin (PRAVACHOL) 20 MG tablet Take 20 mg by mouth every morning.    . rivaroxaban (XARELTO) 20 MG TABS tablet Take 20 mg by mouth daily with supper.    . sucralfate (CARAFATE) 1 G tablet Take 1 g by mouth 4 (four) times daily.     No current facility-administered medications for this visit.     Review of Systems:     Cardiac Review of Systems: Y or N  Chest Pain [  y  ]  Resting SOB [ n  ] Exertional SOB  [ y ]  Orthopnea [  ]   Pedal Edema [   ]    Palpitations [  ] Syncope  [  ]   Presyncope [   ]  General Review of Systems: [Y] = yes [  ]=no Constitional: recent weight change [  ];  Wt loss over the last 3 months [  40 lbs ] anorexia [  ]; fatigue [ y ]; nausea [ y ]; night sweats [  ]; fever [  ]; or chills [  ];          Dental: poor dentition[  ]; Last Dentist visit:   Eye : blurred vision [  ]; diplopia [   ]; vision changes [  ];  Amaurosis fugax[  ]; Resp: cough [  ];  wheezing[  ];  hemoptysis[  ]; shortness of breath[  ]; paroxysmal nocturnal dyspnea[  ]; dyspnea on exertion[ y ]; or orthopnea[  ];  GI:  gallstones[  ], vomiting[  ];  dysphagia[y  ]; melena[  ];  hematochezia [ y ]; heartburn[ y ];   Hx of  Colonoscopy[  ]; GU: kidney stones [  ]; hematuria[  ];   dysuria [  ];  nocturia[  ];  history of     obstruction [  ]; urinary frequency [  ]             Skin: rash, swelling[  ];, hair loss[  ];  peripheral edema[  ];  or itching[  ]; Musculosketetal: myalgias[  ];  joint swelling[  ];  joint erythema[  ];  joint pain[  ];  back pain[  ];  Heme/Lymph:  bruising[  ];  bleeding[  ];  anemia[  ];  Neuro: TIA[  ];  headaches[  ];  stroke[  ];  vertigo[  ];  seizures[  ];   paresthesias[  ];  difficulty walking[ y ];  Psych:depression[  ]; anxiety[ y ];  Endocrine: diabetes[  ];  thyroid dysfunction[  ];  Immunizations: Flu up to date [ n ]; Pneumococcal up to date Florencio.[facility]  ];  Other:  Physical Exam: BP 115/76 mmHg  Pulse 96  Resp 20  Ht 5\' 3"  (1.6 m)  Wt 151 lb (68.493 kg)  BMI 26.76 kg/m2  SpO2 97%  PHYSICAL EXAMINATION:  General appearance: alert, cooperative, appears older than stated age, cachectic and fatigued Neurologic: intact Heart: irregularly irregular rhythm Lungs: diminished breath sounds bibasilar Abdomen: Abdomen mildly distended with intact G-tube in the left upper abdomen Extremities: extremities normal, atraumatic, no cyanosis or edema and Homans sign is negative, no sign of DVT Wound: There appears to be no infection around the I do not appreciate cervical or supraclavicular adenopathy PEG tube is in place without surrounding infection,  Diagnostic Studies & Laboratory data:     Recent Radiology Findings:  CLINICAL DATA: Shortness of breath; history of esophageal malignancy, PEG tube placement, appendectomy, hysterectomy, and lumbar fusion.  EXAM: CT ANGIOGRAPHY CHEST  CT ABDOMEN AND PELVIS WITH CONTRAST  TECHNIQUE: Multidetector CT imaging of the chest was performed using the standard protocol during bolus administration of intravenous contrast. Multiplanar CT image reconstructions and MIPs were obtained to evaluate the vascular anatomy. Multidetector CT imaging of the abdomen and pelvis was performed using the standard protocol during bolus administration of intravenous contrast.  CONTRAST: 100 cc of Isovue 370 intravenously. The patient also received oral contrast material through the PEG tube.  COMPARISON: Portable chest x-ray of today's date and PET-CT study of April 21, 2014 and CT scan of the chest  and abdomen abdomen dated March 31, 2014  FINDINGS: CTA CHEST FINDINGS  There are small filling defects within peripheral pulmonary arterial branches to the right lower lobe. There are no filling defects elsewhere on the right nor on the left. The caliber of the thoracic aorta is normal. The cardiac chambers are normal in size. The RV LV ratio is approximately 1. There is no pericardial effusion. There is no lymphadenopathy. There is thickening of the wall of the lower 1/3 of the esophagus without visible discrete mass. There is no free mediastinal fluid or air.  At lung window settings there is no pulmonary parenchymal mass. There is increased density in the posterior medial aspect of the right costophrenic gutter which may reflect pneumonia or pulmonary infarction new since the previous studies. There is no pleural effusion nor pulmonary parenchymal mass.  There are degenerative changes of the lower thoracic discs. There is no compression fracture. The  sternum and observed ribs exhibit no acute abnormalities.  CT ABDOMEN and PELVIS FINDINGS  The liver, gallbladder, pancreas, spleen, right adrenal gland, and kidneys are normal. There is stable mild enlargement of the left adrenal gland. There is no bulky periaortic or pericaval lymphadenopathy. The abdominal aorta exhibits mural thrombus and calcified plaque, but no evidence of aneurysm. The stomach contains a PEG tube and is of partially distended with gas. The GE junction region remains prominent. No perigastric lymphadenopathy is demonstrated. The small bowel is normal. There is a moderate stool burden within the colon without evidence of obstruction. The rectum is mildly distended with gas and stool. The urinary bladder is unremarkable. The uterus is surgically absent. The patient has undergone previous posterior fusion at L3-4 and L4-5. The vertebral bodies are preserved in height. The bony pelvis is unremarkable.  Review  of the MIP images confirms the above findings.  IMPRESSION: 1. There are small emboli within branches of the right lower lobe pulmonary artery posteriorly without evidence of significant right heart strain. There is small amount of adjacent presumed infarct versus pneumonia. 2. There is mild thickening of the wall of the distal third of the esophagus without evidence of a discrete mass or perforation. 3. There is no evidence of CHF nor pulmonary parenchymal masses. There is no pleural effusion. 4. No acute intra-abdominal abnormality is demonstrated. There are atherosclerotic changes of the abdominal aorta without evidence of dissection or aneurysm. There is stable mild enlargement of the left adrenal gland. 5. These results were called by telephone at the time of interpretation on 07/21/2014 at 12:59 pm to Dr. Isla Pence MD, who verbally acknowledged these results.   Electronically Signed By: David Martinique On: 07/21/2014 12:59   CLINICAL DATA: Initial treatment strategy for esophageal carcinoma.  EXAM: NUCLEAR MEDICINE PET SKULL BASE TO THIGH  TECHNIQUE: 11.0 mCi F-18 FDG was injected intravenously. Full-ring PET imaging was performed from the skull base to thigh after the radiotracer. CT data was obtained and used for attenuation correction and anatomic localization.  FASTING BLOOD GLUCOSE: Value: 73 mg/dl  COMPARISON: CT 03/31/2014  FINDINGS: NECK  No hypermetabolic lymph nodes in the neck.  CHEST  There is a long segment of intense hypermetabolic activity associated with the esophagus. This extends from relate just superior to the carina through the GE junction with SUV max equal 8.5. There is a hypermetabolic mass at the esophageal junction extending to the gastric cardia with SUV max equals 17.3. No hypermetabolic mediastinal lymph nodes. No suspicious pulmonary nodules.  ABDOMEN/PELVIS  Hypermetabolic mass in the gastric cardia described above. There  is no hypermetabolic gastro hepatic ligament lymph nodes. No abnormal metabolic activity within the liver.  No hypermetabolic abdominal pelvic lymph nodes  SKELETON  No focal hypermetabolic activity to suggest skeletal metastasis.  IMPRESSION: 1. Hypermetabolic mass in the gastric cardia and esophageal junction consists with primary esophageal / gastric carcinoma. 2. Long segment intense metabolic activity associated with the mid and distal esophagus. Differential includes esophageal carcinoma versus esophagitis. 3. No evidence of hypermetabolic mediastinal or gastrohepatic ligament lymph nodes. 4. No evidence of liver metastasis. 5. No distant metastasis   Electronically Signed By: Suzy Bouchard M.D. On: 04/21/2014 13:57    CLINICAL DATA: Difficulty swallowing and epigastric pain. Esophageal disorder. Prior appendectomy and hysterectomy.  EXAM: CT CHEST AND ABDOMEN WITH CONTRAST  TECHNIQUE: Multidetector CT imaging of the chest and abdomen was performed following the standard protocol during bolus administration of intravenous contrast.  CONTRAST: 80 cc Isovue  370  COMPARISON: Abdominal pelvic CT 02/10/2014. Chest radiograph 04/07/2013. Chest CT 04/23/2008.  FINDINGS: CT CHEST FINDINGS  Lungs/Pleura: No nodules or airspace opacities. No pleural fluid.  Heart/Mediastinum: No supraclavicular adenopathy. Mildly age advanced aortic and branch vessel atherosclerosis. Normal heart size with lipomatous hypertrophy of the interatrial septum. Multivessel coronary artery atherosclerosis. No mediastinal or hilar adenopathy. Air contrast level in the thoracic esophagus on image 28 of series 2 and more superiorly on image 19/series 2.  CT ABDOMEN FINDINGS  Abdomen: Normal liver, spleen. Redemonstration of soft tissue fullness involving the gastroesophageal junction and proximal stomach. Example images 37-42 of series 2. Suspect fluid/gastric contents with soft  tissue density in the gastric cardia/body junction on image 39/series 2.  Normal pancreas, gallbladder, biliary tract. Mild right adrenal thickening and left adrenal nodularity are grossly similar back to 2009. Suspect an underlying left adrenal adenoma at 1.3 cm. 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 Normal right kidney. Advanced aortic and branch vessel atherosclerosis. Ulcerative plaque within the infrarenal aorta, including on image 62/series 2. Beam hardening artifact from spinal hardware. No retroperitoneal or retrocrural adenopathy. Normal colon and terminal ileum. Normal abdominal small bowel without ascites. No evidence of omental or peritoneal disease.  Bones/Musculoskeletal: L3-5 lumbar spine fixation. Lower thoracic degenerative disc disease.  IMPRESSION: CT CHEST IMPRESSION  1. No acute process in the chest. 2. Age advanced coronary artery atherosclerosis. Recommend assessment of coronary risk factors and consideration of medical therapy. 3. Mildly dilated thoracic esophagus with contrast within. This suggests a component of esophageal obstruction, dysmotility, or gastroesophageal reflux disease.  CT ABDOMEN AND PELVIS IMPRESSION  1. Redemonstration of soft tissue fullness at the distal esophagus and proximal stomach. Cannot exclude gastritis or gastroesophageal carcinoma. If not already performed, endoscopy is recommended. 2. Advanced abdominal aortic and branch vessel atherosclerosis.   Electronically Signed By: Abigail Miyamoto M.D. On: 03/31/2014 14:14   Pretreatment EUS: Endoscopic findings: 1. Malignant mass in distal esophagus. The mass was circumferential, patially obstructing but I was able to fairly easily advance echoendoscope through the strictured lumen. The proximal edge was 34 cm from the incisors and distal edge (just below the GE junction) was at 39 cm. The mass is 5cm long. EUS findings: 1. The mass above corresponded with  a hypoechoic, heterogeneous mass that clearly passes into and through the muscularis propria layer of the esophageal wall (uT3). 2. There were two round, well demarcated, 5-28mm, hypoechoic paraesophageal lymphnodes that lay directly adjacent to the mass that are suspicious for malignant involvement (uN1). 3. No celiac adenopathy. Impression: uT3N1 (clinical stage IIIa) 5cm long, cirumferential GE junction adenocarcinoma with proximal edge at 34cm from incisors and distal edge just below the GE junction.  Recent Lab Findings: No results found for: WBC    Assessment / Plan:   Patient appears to be a frail 68 year old female with advanced stage esophageal cancer at least IIIa, recent CT scan shows evidence of pulmonary embolus but no evidence of widespread metastatic disease. Both her underlying health and difficulty tolerating previous therapy these her at this point being a very poor surgical candidate from a medical standpoint. The patient does look improved from when I saw her several weeks ago, but is still frail and just moving around the office. I think at this point whatever benefit could be gained and decrease recurrence from total esophagectomy would be totally negated by her multiple medical comorbidities and frail condition . I discussed this with her and her daughter. The patient states that she  does not really want an operation anyway.  I am willing to see her back in 2-3 months  and we will see how she was doing.       Grace Isaac MD      [address] ,[place] [address] Office [phone]   Beeper [phone]  09/16/2014 2:50 PM

## 2014-11-30 ENCOUNTER — Telehealth: Payer: Self-pay | Admitting: Family Medicine

## 2014-11-30 ENCOUNTER — Inpatient Hospital Stay (HOSPITAL_COMMUNITY)
Admission: AD | Admit: 2014-11-30 | Payer: Commercial Managed Care - PPO | Source: Other Acute Inpatient Hospital | Admitting: Internal Medicine

## 2014-11-30 ENCOUNTER — Encounter (HOSPITAL_COMMUNITY): Payer: Self-pay | Admitting: *Deleted

## 2014-11-30 ENCOUNTER — Inpatient Hospital Stay (HOSPITAL_COMMUNITY)
Admission: AD | Admit: 2014-11-30 | Discharge: 2014-12-07 | DRG: 871 | Disposition: A | Discharge status: Home Health | Payer: Commercial Managed Care - PPO | Source: Other Acute Inpatient Hospital | Attending: Internal Medicine | Admitting: Internal Medicine

## 2014-11-30 DIAGNOSIS — E1140 Type 2 diabetes mellitus with diabetic neuropathy, unspecified: Secondary | ICD-10-CM | POA: Diagnosis present

## 2014-11-30 DIAGNOSIS — Z79899 Other long term (current) drug therapy: Secondary | ICD-10-CM

## 2014-11-30 DIAGNOSIS — J189 Pneumonia, unspecified organism: Secondary | ICD-10-CM | POA: Diagnosis present

## 2014-11-30 DIAGNOSIS — Z8249 Family history of ischemic heart disease and other diseases of the circulatory system: Secondary | ICD-10-CM

## 2014-11-30 DIAGNOSIS — Z86711 Personal history of pulmonary embolism: Secondary | ICD-10-CM | POA: Diagnosis not present

## 2014-11-30 DIAGNOSIS — J9601 Acute respiratory failure with hypoxia: Secondary | ICD-10-CM | POA: Diagnosis present

## 2014-11-30 DIAGNOSIS — Z833 Family history of diabetes mellitus: Secondary | ICD-10-CM

## 2014-11-30 DIAGNOSIS — R05 Cough: Secondary | ICD-10-CM | POA: Diagnosis present

## 2014-11-30 DIAGNOSIS — Z801 Family history of malignant neoplasm of trachea, bronchus and lung: Secondary | ICD-10-CM | POA: Diagnosis not present

## 2014-11-30 DIAGNOSIS — K209 Esophagitis, unspecified without bleeding: Secondary | ICD-10-CM | POA: Insufficient documentation

## 2014-11-30 DIAGNOSIS — Z808 Family history of malignant neoplasm of other organs or systems: Secondary | ICD-10-CM | POA: Diagnosis not present

## 2014-11-30 DIAGNOSIS — D696 Thrombocytopenia, unspecified: Secondary | ICD-10-CM | POA: Diagnosis present

## 2014-11-30 DIAGNOSIS — K2210 Ulcer of esophagus without bleeding: Secondary | ICD-10-CM | POA: Diagnosis not present

## 2014-11-30 DIAGNOSIS — E43 Unspecified severe protein-calorie malnutrition: Secondary | ICD-10-CM | POA: Diagnosis present

## 2014-11-30 DIAGNOSIS — R1310 Dysphagia, unspecified: Secondary | ICD-10-CM | POA: Diagnosis present

## 2014-11-30 DIAGNOSIS — A419 Sepsis, unspecified organism: Principal | ICD-10-CM

## 2014-11-30 DIAGNOSIS — I129 Hypertensive chronic kidney disease with stage 1 through stage 4 chronic kidney disease, or unspecified chronic kidney disease: Secondary | ICD-10-CM | POA: Diagnosis present

## 2014-11-30 DIAGNOSIS — K259 Gastric ulcer, unspecified as acute or chronic, without hemorrhage or perforation: Secondary | ICD-10-CM | POA: Diagnosis not present

## 2014-11-30 DIAGNOSIS — Z6828 Body mass index (BMI) 28.0-28.9, adult: Secondary | ICD-10-CM | POA: Diagnosis not present

## 2014-11-30 DIAGNOSIS — Z7901 Long term (current) use of anticoagulants: Secondary | ICD-10-CM | POA: Diagnosis not present

## 2014-11-30 DIAGNOSIS — C159 Malignant neoplasm of esophagus, unspecified: Secondary | ICD-10-CM

## 2014-11-30 DIAGNOSIS — Y842 Radiological procedure and radiotherapy as the cause of abnormal reaction of the patient, or of later complication, without mention of misadventure at the time of the procedure: Secondary | ICD-10-CM | POA: Diagnosis present

## 2014-11-30 DIAGNOSIS — N182 Chronic kidney disease, stage 2 (mild): Secondary | ICD-10-CM | POA: Diagnosis present

## 2014-11-30 DIAGNOSIS — R0789 Other chest pain: Secondary | ICD-10-CM | POA: Insufficient documentation

## 2014-11-30 DIAGNOSIS — I959 Hypotension, unspecified: Secondary | ICD-10-CM

## 2014-11-30 DIAGNOSIS — E785 Hyperlipidemia, unspecified: Secondary | ICD-10-CM | POA: Diagnosis present

## 2014-11-30 DIAGNOSIS — Z823 Family history of stroke: Secondary | ICD-10-CM

## 2014-11-30 DIAGNOSIS — K2970 Gastritis, unspecified, without bleeding: Secondary | ICD-10-CM | POA: Diagnosis not present

## 2014-11-30 DIAGNOSIS — E1165 Type 2 diabetes mellitus with hyperglycemia: Secondary | ICD-10-CM | POA: Diagnosis present

## 2014-11-30 DIAGNOSIS — E119 Type 2 diabetes mellitus without complications: Secondary | ICD-10-CM

## 2014-11-30 DIAGNOSIS — F17210 Nicotine dependence, cigarettes, uncomplicated: Secondary | ICD-10-CM | POA: Diagnosis present

## 2014-11-30 LAB — MRSA PCR SCREENING: MRSA by PCR: NEGATIVE

## 2014-11-30 LAB — GLUCOSE, CAPILLARY
GLUCOSE-CAPILLARY: 104 mg/dL — AB (ref 70–99)
Glucose-Capillary: 224 mg/dL — ABNORMAL HIGH (ref 70–99)
Glucose-Capillary: 171 mg/dL — ABNORMAL HIGH (ref 70–99)
Glucose-Capillary: 161 mg/dL — ABNORMAL HIGH (ref 70–99)

## 2014-11-30 LAB — CBC
HEMATOCRIT: 35.7 % — AB (ref 36.0–46.0)
Hemoglobin: 12 g/dL (ref 12.0–15.0)
MCH: 31.5 pg (ref 26.0–34.0)
MCHC: 33.6 g/dL (ref 30.0–36.0)
MCV: 93.7 fL (ref 78.0–100.0)
Platelets: 61 10*3/uL — ABNORMAL LOW (ref 150–400)
RBC: 3.81 MIL/uL — AB (ref 3.87–5.11)
RDW: 13.3 % (ref 11.5–15.5)
WBC: 5 10*3/uL (ref 4.0–10.5)

## 2014-11-30 LAB — BASIC METABOLIC PANEL
Anion gap: 6 (ref 5–15)
BUN: 11 mg/dL (ref 6–23)
CO2: 18 mmol/L — AB (ref 19–32)
Calcium: 7 mg/dL — ABNORMAL LOW (ref 8.4–10.5)
Chloride: 110 mmol/L (ref 96–112)
Creatinine, Ser: 0.84 mg/dL (ref 0.50–1.10)
GFR calc non Af Amer: 70 mL/min — ABNORMAL LOW (ref >90)
GFR, EST AFRICAN AMERICAN: 81 mL/min — AB (ref >90)
Glucose, Bld: 275 mg/dL — ABNORMAL HIGH (ref 70–99)
POTASSIUM: 3.7 mmol/L (ref 3.5–5.1)
Sodium: 134 mmol/L — ABNORMAL LOW (ref 135–145)

## 2014-11-30 LAB — STREP PNEUMONIAE URINARY ANTIGEN: Strep Pneumo Urinary Antigen: NEGATIVE

## 2014-11-30 MED ORDER — GABAPENTIN 300 MG PO CAPS
300.0000 mg | ORAL_CAPSULE | Freq: Four times a day (QID) | ORAL | Status: DC
  Administered 2014-11-30 – 2014-12-07 (×28): 300 mg via ORAL
  Filled 2014-11-30 (×31): qty 1

## 2014-11-30 MED ORDER — CETYLPYRIDINIUM CHLORIDE 0.05 % MT LIQD
7.0000 mL | Freq: Two times a day (BID) | OROMUCOSAL | Status: DC
  Administered 2014-11-30 – 2014-12-07 (×14): 7 mL via OROMUCOSAL

## 2014-11-30 MED ORDER — DEXTROSE 5 % IV SOLN: 1.0000 g | INTRAVENOUS | Status: DC

## 2014-11-30 MED ORDER — ONDANSETRON HCL 4 MG PO TABS
4.0000 mg | ORAL_TABLET | Freq: Four times a day (QID) | ORAL | Status: DC | PRN
  Administered 2014-11-30: 4 mg via ORAL
  Filled 2014-11-30 (×2): qty 1

## 2014-11-30 MED ORDER — SODIUM CHLORIDE 0.9 % IV SOLN
INTRAVENOUS | Status: DC
  Administered 2014-11-30 – 2014-12-03 (×5): via INTRAVENOUS

## 2014-11-30 MED ORDER — ENSURE COMPLETE PO LIQD: 237.0000 mL | Freq: Two times a day (BID) | ORAL | Status: DC

## 2014-11-30 MED ORDER — HYDROCODONE-ACETAMINOPHEN 5-325 MG PO TABS
1.0000 | ORAL_TABLET | ORAL | Status: DC | PRN
  Administered 2014-11-30 – 2014-12-07 (×16): 1 via ORAL
  Filled 2014-11-30 (×16): qty 1

## 2014-11-30 MED ORDER — PANTOPRAZOLE SODIUM 40 MG PO TBEC
80.0000 mg | DELAYED_RELEASE_TABLET | Freq: Every day | ORAL | Status: DC
  Administered 2014-11-30 – 2014-12-01 (×2): 80 mg via ORAL
  Filled 2014-11-30 (×3): qty 2

## 2014-11-30 MED ORDER — RIVAROXABAN 20 MG PO TABS: 20.0000 mg | ORAL_TABLET | Freq: Every day | ORAL | Status: DC

## 2014-11-30 MED ORDER — PRAVASTATIN SODIUM 20 MG PO TABS
20.0000 mg | ORAL_TABLET | Freq: Every day | ORAL | Status: DC
  Administered 2014-11-30 – 2014-12-06 (×7): 20 mg via ORAL
  Filled 2014-11-30 (×9): qty 1

## 2014-11-30 MED ORDER — INFLUENZA VAC SPLIT QUAD 0.5 ML IM SUSY
0.5000 mL | PREFILLED_SYRINGE | INTRAMUSCULAR | Status: DC
  Filled 2014-11-30 (×2): qty 0.5

## 2014-11-30 MED ORDER — INSULIN ASPART 100 UNIT/ML ~~LOC~~ SOLN
0.0000 [IU] | Freq: Three times a day (TID) | SUBCUTANEOUS | Status: DC
  Administered 2014-11-30: 3 [IU] via SUBCUTANEOUS
  Administered 2014-11-30: 5 [IU] via SUBCUTANEOUS
  Administered 2014-11-30 – 2014-12-01 (×2): 3 [IU] via SUBCUTANEOUS
  Administered 2014-12-01: 2 [IU] via SUBCUTANEOUS
  Administered 2014-12-01 – 2014-12-02 (×3): 3 [IU] via SUBCUTANEOUS
  Administered 2014-12-03: 5 [IU] via SUBCUTANEOUS
  Administered 2014-12-03: 3 [IU] via SUBCUTANEOUS
  Administered 2014-12-04 – 2014-12-06 (×5): 2 [IU] via SUBCUTANEOUS
  Administered 2014-12-07: 3 [IU] via SUBCUTANEOUS

## 2014-11-30 MED ORDER — AZITHROMYCIN 500 MG PO TABS
500.0000 mg | ORAL_TABLET | Freq: Every day | ORAL | Status: AC
  Administered 2014-11-30 – 2014-12-06 (×7): 500 mg via ORAL
  Filled 2014-11-30: qty 2
  Filled 2014-11-30 (×6): qty 1

## 2014-11-30 MED ORDER — DEXTROSE 5 % IV SOLN
1.0000 g | INTRAVENOUS | Status: AC
  Administered 2014-11-30 – 2014-12-05 (×6): 1 g via INTRAVENOUS
  Filled 2014-11-30 (×6): qty 10

## 2014-11-30 MED ORDER — BOOST / RESOURCE BREEZE PO LIQD
1.0000 | Freq: Three times a day (TID) | ORAL | Status: DC
  Administered 2014-11-30 – 2014-12-05 (×8): 1 via ORAL

## 2014-11-30 NOTE — Progress Notes (Signed)
Patient ID: Sharon Price, female   DOB: [birth year], 69 y.o.   MRN: [id number] [facility] PROGRESS NOTE  Sharon Price ZDG:[id number] DOB: [birth year] DOA: 11/30/2014 PCP: Sharon Anes, MD   Brief narrative:    Addendum to admission note done 11/30/2014  69 year old female with past medical history of esophageal adenocarcinoma, diabetes, dyslipidemia who presented to [facility] from [place] ED with progressive generalized weakness, cough, shortness of breath for past 1 week prior to this admission. Work up in ED revealed left lower lobe pneumonia as seen on CXR. She was hypotensive with SBP 70's to 110's with 3 L IVF. Lactic acid was 1.8. She was started on rocpehin and azithromycin and admitted for further management of pneumonia and possible sepsis   Assessment/Plan:    Principal Problem: Acute respiratory failure with hypoxia / CAP (community acquired pneumonia) - patient with oxygen saturation of 93% with [place] oxygen support. She was found to have LLL pneumonia on CXR. - she was started on rocephin and azithromycin - follow up blood culture results, HIV, legionella and strep pneumonia results  - respiratory status remains stable  Active Problems: Sepsis secondary to pneumonia - sepsis criteria met on admission with hypotension, tachypnea, fever. Lactic acid 1.8 on admission.  - patient started on rocephin and azithromycin   - follow up blood culture results, HIV, legionella and strep pneumonia results   Diabetes mellitus with complications of neuropathy - check A1c - continue SSI for now - continue gabapentin for neuropathy   Esophageal adenocarcinoma - The patient has completed neoadjuvant chemotherapy radiation with weekly carboplatin and and paclltaxel in AUG 2015. Of note, she had treatment course complicated with severe dehydration and poor nutrition, requiring a PEG tube to be placed which has become infected and had to be replaced several times. Also she had  CT  scan 07/21/2014 and noted to have evidence of pulmonary emboli, she was on  Lovenox and then switched to xarelto but this AC on hold due to thrombocytopenia. - check CBC today   Thrombocytopenia - secondary to history of malignancy and sequela of chemotherapy - check CBC this am  Protein calorie malnutrition, severe - in the context of chronic illness - continue nutritional supplementation   Dyslipidemia - resume statin therapy  Code Status: Full.  Family Communication:  plan of care discussed with the patient Disposition Plan: Home when stable.   IV access:  Peripheral IV  Procedures and diagnostic studies:    No results found.  Medical Consultants:  None   Other Consultants:  None   IAnti-Infectives:   Azithromycin 11/30/2014 --> Rocephin 11/30/2014 -->    Sharon Ramsay, MD  [facility] Pager [phone]  If 7PM-7AM, please contact night-coverage [email or web address] Password [facility] 11/30/2014, 9:54 AM   LOS: 0 days   HPI/Subjective: No events overnight.   Objective: Filed Vitals:   11/30/14 0500 11/30/14 0600 11/30/14 0700 11/30/14 0800  BP: 110/53 102/56 115/53 103/58  Pulse: 75 72 67 69  Temp: 98.4 F (36.9 C)   98.5 F (36.9 C)  TempSrc: Oral   Oral  Resp: _0 Height: _1  (1.575 m)     Weight: 69.4 kg (153 lb)     SpO2: 96% 93% 94% 95%    Intake/Output Summary (Last 24 hours) at 11/30/14 0954 Last data filed at 11/30/14 0900  Gross per 24 hour  Intake    300 ml  Output    700 ml  Net   -400 ml  Exam:   General:  Pt is alert, follows commands appropriately, not in acute distress  Cardiovascular: Regular rate and rhythm, S1/S2 (+)  Respiratory: slightly diminished breath sounds, no wheezing   Abdomen: Soft, non tender, non distended, bowel sounds present, no guarding  Extremities: No edema, pulses DP and PT palpable bilaterally  Neuro: Grossly nonfocal  Data Reviewed: Basic Metabolic Panel: No results for input(s): NA, K, CL,  CO2, GLUCOSE, BUN, CREATININE, CALCIUM, MG, PHOS in the last 168 hours. Liver Function Tests: No results for input(s): AST, ALT, ALKPHOS, BILITOT, PROT, ALBUMIN in the last 168 hours. No results for input(s): LIPASE, AMYLASE in the last 168 hours. No results for input(s): AMMONIA in the last 168 hours. CBC: No results for input(s): WBC, NEUTROABS, HGB, HCT, MCV, PLT in the last 168 hours. Cardiac Enzymes: No results for input(s): CKTOTAL, CKMB, CKMBINDEX, TROPONINI in the last 168 hours. BNP: Invalid input(s): POCBNP CBG:  Recent Labs Lab 11/30/14 0844  GLUCAP 224*    No results found for this or any previous visit (from the past 240 hour(s)).   Scheduled Meds: . azithromycin  500 mg Oral Daily  . cefTRIAXone (ROCEPHIN)  IV  1 g Intravenous Q24H  . feeding supplement (ENSURE COMPLETE)  237 mL Oral BID BM  . gabapentin  300 mg Oral QID  . insulin aspart  0-15 Units Subcutaneous TID WC  . pantoprazole  80 mg Oral Daily  . pravastatin  20 mg Oral q1800   Continuous Infusions: . sodium chloride 75 mL/hr at 11/30/14 0900

## 2014-11-30 NOTE — Progress Notes (Signed)
CARE MANAGEMENT NOTE 11/30/2014  Patient:  Sharon Price, Sharon Price   Account Number:  [id number]  Date Initiated:  11/30/2014  Documentation initiated by:  Abiola Behring  Subjective/Objective Assessment:   hypotensive and lll pna txd from [facility] for further care     Action/Plan:   home when stable   Anticipated DC Date:  12/03/2014   Anticipated DC Plan:  HOME/SELF CARE  In-house referral  NA      DC Planning Services  CM consult      PAC Choice  NA   Choice offered to / List presented to:  NA   DME arranged  NA      DME agency  NA     [place] arranged  NA      [place] agency  NA   Status of service:  In process, will continue to follow Medicare Important Message given?   (If response is "NO", the following Medicare IM given date fields will be blank) Date Medicare IM given:   Medicare IM given by:   Date Additional Medicare IM given:   Additional Medicare IM given by:    Discharge Disposition:    Per UR Regulation:  Reviewed for med. necessity/level of care/duration of stay  If discussed at [place] of Stay Meetings, dates discussed:    Comments:  Feb. 02 2016/[place] Aina L. Rosana Hoes, RN, BSN, CCM/Case Management [place] [phone] No discharge needs present of time of review.

## 2014-11-30 NOTE — Telephone Encounter (Signed)
[place] to [facility]  EDP: Colin Rhein  Reason: weakness, fever, LLL PNA, 3L (92%), lactate 1.8, BP 70s-110s s/p 3L, WBC 5.1, 69 plt, smoker but no wheezing. PH 7.4. Discussed case w/ critical care who feels pt appropriate for stepdown.

## 2014-11-30 NOTE — Progress Notes (Signed)
INITIAL NUTRITION ASSESSMENT  DOCUMENTATION CODES Per approved criteria  -Not Applicable   INTERVENTION: - Resource Breeze po TID, each supplement provides 250 kcal and 9 grams of protein - RD will continue to monitor  NUTRITION DIAGNOSIS: Inadequate oral intake related to CAP as evidenced by poor po.   Goal: Pt to meet >/= 90% of their estimated nutrition needs   Monitor:  Weight trend, po intake, acceptance of supplements, labs  Reason for Assessment: MST  69 y.o. female  Admitting Dx: CAP (community acquired pneumonia)  ASSESSMENT: 69 y.o. female who presents to [facility] ED with 1 week history of generalized weakness, cough, SOB, BGL over 400   - Pt with CAP. She reported no recent weight loss. Pt lost weight during treatment for esophageal cancer treatment and had a PEG placed. PEG has since been removed and pt was eating much better. Pt reports that her weight has been increasing and her appetite has been good up until one week ago. She reports that Ensure makes her vomit. Will order clear liquid nutritional supplements.   - Labs reviewed.   Height: Ht Readings from Last 1 Encounters:  11/30/14 5\' 2"  (1.575 m)    Weight: Wt Readings from Last 1 Encounters:  11/30/14 157 lb 3 oz (71.3 kg)    Ideal Body Weight: 50.1 kg  % Ideal Body Weight: 142%  Wt Readings from Last 10 Encounters:  11/30/14 157 lb 3 oz (71.3 kg)  09/16/14 151 lb (68.493 kg)  08/10/14 136 lb (61.689 kg)  04/08/14 163 lb (73.936 kg)   BMI:  Body mass index is 28.74 kg/(m^2).  Estimated Nutritional Needs: Kcal: 1800-2000 Protein: 100-115 g Fluid: 1.8-2.0 L/day  Skin: puncture wound on abdomen  Diet Order: Diet Carb Modified  EDUCATION NEEDS: -Education needs addressed   Intake/Output Summary (Last 24 hours) at 11/30/14 1412 Last data filed at 11/30/14 0900  Gross per 24 hour  Intake    300 ml  Output    700 ml  Net   -400 ml    Last BM: 2/2   Labs:   Recent Labs Lab  11/30/14 0644  NA 134*  K 3.7  CL 110  CO2 18*  BUN 11  CREATININE 0.84  CALCIUM 7.0*  GLUCOSE 275*    CBG (last 3)   Recent Labs  11/30/14 0844 11/30/14 1144  GLUCAP 224* 171*    Scheduled Meds: . antiseptic oral rinse  7 mL Mouth Rinse BID  . azithromycin  500 mg Oral Daily  . cefTRIAXone (ROCEPHIN)  IV  1 g Intravenous Q24H  . feeding supplement (ENSURE COMPLETE)  237 mL Oral BID BM  . gabapentin  300 mg Oral QID  . [START ON 12/01/2014] Influenza vac split quadrivalent PF  0.5 mL Intramuscular Tomorrow-1000  . insulin aspart  0-15 Units Subcutaneous TID WC  . pantoprazole  80 mg Oral Daily  . pravastatin  20 mg Oral q1800    Continuous Infusions: . sodium chloride 75 mL/hr at 11/30/14 0900    Past Medical History  Diagnosis Date  . Diabetes mellitus without complication   . Chronic kidney disease     renal..STAGE 2  . Hypertension   . Mechanical dysphagia     MOSTLY SOLIDS  . Pulmonary emboli 9/223/15 [place]    RIGHT LOWER LOBE PER CT CHEST   . Patient on combined chemotherapy and radiation     [facility]  . Cancer 03/30/14    GE  JUNCTION    Past Surgical History  Procedure Laterality Date  . Back surgery    . Abdominal hysterectomy    . Tubal ligation    . Eus N/A 04/08/2014    Procedure: ESOPHAGEAL ENDOSCOPIC ULTRASOUND (EUS) RADIAL;  Surgeon: Milus Banister, MD;  Location: [facility] ENDOSCOPY;  Service: Endoscopy;  Laterality: N/A;  . Appendectomy    . Spine surgery    . Lumbar fusion      Laurette Schimke MS, RD, LDN

## 2014-11-30 NOTE — Progress Notes (Signed)
Pt arrived from ICU in w/c, amb self to bed w/ stand by assist. VSS on 2lnc. Oriented to callbell and environment. POC discussed. SCDs applied, tele 19 confirmed w/ CCMD.

## 2014-11-30 NOTE — H&P (Signed)
[facility] History and Physical  LINNETTE PANELLA ZRA:[id number] DOB: 13-Jan-1946 DOA: 11/30/2014  Referring physician: EDP PCP: Lillard Anes, MD   Chief Complaint: Cough, SOB   HPI: Sharon Price is a 69 y.o. female who presents to [facility] ED with 1 week history of generalized weakness, cough, SOB, BGL over 400 (has h/o DM2 but hasnt been on anything for this since loosing a lot of weight with esophageal cancer).  Cough is productive of white sputum.  This has worsened over the past day or so which prompted her to go to the ED this evening.  Work up in the [employer] ED was suggestive of a LLL PNA, patient was initially hypotensive with SBPs in the 70s, this improved to the 110s with IVF.  She is being transferred to [facility] for a stepdown bed admission.  Review of Systems: Systems reviewed.  As above, otherwise negative  Past Medical History  Diagnosis Date  . Diabetes mellitus without complication   . Chronic kidney disease     renal..STAGE 2  . Hypertension   . Mechanical dysphagia     MOSTLY SOLIDS  . Pulmonary emboli 9/223/15 [place]    RIGHT LOWER LOBE PER CT CHEST   . Patient on combined chemotherapy and radiation     [facility]  . Cancer 03/30/14    GE JUNCTION   Past Surgical History  Procedure Laterality Date  . Back surgery    . Abdominal hysterectomy    . Tubal ligation    . Eus N/A 04/08/2014    Procedure: ESOPHAGEAL ENDOSCOPIC ULTRASOUND (EUS) RADIAL;  Surgeon: Milus Banister, MD;  Location: [facility] ENDOSCOPY;  Service: Endoscopy;  Laterality: N/A;  . Appendectomy    . Spine surgery    . Lumbar fusion     Social History:  reports that she has been smoking.  She does not have any smokeless tobacco history on file. She reports that she does not drink alcohol or use illicit drugs.  No Known Allergies  Family History  Problem Relation Age of Onset  . Diabetes Mother   . Hypertension Mother   . Stroke  Mother   . Heart disease Mother   . Hypertension Father   . Diabetes Sister   . Cancer Sister     THYROID  . Cancer Brother     LUNG  . Diabetes Daughter   . Diabetes Son      Prior to Admission medications   Medication Sig Start Date End Date Taking? Authorizing Provider  Docusate Sodium (COLACE PO) Take by mouth.    Historical Provider, MD  gabapentin (NEURONTIN) 300 MG capsule Take 300 mg by mouth 4 (four) times daily.    Historical Provider, MD  HYDROcodone-acetaminophen (NORCO/VICODIN) 5-325 MG per tablet Take 1 tablet by mouth every 4 (four) hours as needed for moderate pain.    Historical Provider, MD  lisinopril (PRINIVIL,ZESTRIL) 40 MG tablet Take 40 mg by mouth daily.    Historical Provider, MD  LORazepam (ATIVAN) 0.5 MG tablet Take 0.5 mg by mouth.    Historical Provider, MD  metoCLOPramide (REGLAN) 10 MG tablet Take 10 mg by mouth 4 (four) times daily.    Historical Provider, MD  omeprazole (PRILOSEC) 20 MG capsule Take 20 mg by mouth 2 (two) times daily before a meal.    Historical Provider, MD  ondansetron (ZOFRAN) 4 MG tablet Take 4 mg by mouth every 6 (six) hours as needed for nausea or vomiting.  Historical Provider, MD  pravastatin (PRAVACHOL) 20 MG tablet Take 20 mg by mouth every morning.    Historical Provider, MD  rivaroxaban (XARELTO) 20 MG TABS tablet Take 20 mg by mouth daily with supper.    Historical Provider, MD  sucralfate (CARAFATE) 1 G tablet Take 1 g by mouth 4 (four) times daily.    Historical Provider, MD   Physical Exam: There were no vitals filed for this visit.  There were no vitals taken for this visit.  General Appearance:    Alert, oriented, no distress, appears stated age  Head:    Normocephalic, atraumatic  Eyes:    PERRL, EOMI, sclera non-icteric        Nose:   Nares without drainage or epistaxis. Mucosa, turbinates normal  Throat:   Moist mucous membranes. Oropharynx without erythema or exudate.  Neck:   Supple. No carotid bruits.  No  thyromegaly.  No lymphadenopathy.   Back:     No CVA tenderness, no spinal tenderness  Lungs:     Breath sounds diminished on the left.  Chest wall:    No tenderness to palpitation  Heart:    Regular rate and rhythm without murmurs, gallops, rubs  Abdomen:     Soft, non-tender, nondistended, normal bowel sounds, no organomegaly  Genitalia:    deferred  Rectal:    deferred  Extremities:   No clubbing, cyanosis or edema.  Pulses:   2+ and symmetric all extremities  Skin:   Skin color, texture, turgor normal, no rashes or lesions  Lymph nodes:   Cervical, supraclavicular, and axillary nodes normal  Neurologic:   CNII-XII intact. Normal strength, sensation and reflexes      throughout    Labs on Admission:  Basic Metabolic Panel: No results for input(s): NA, K, CL, CO2, GLUCOSE, BUN, CREATININE, CALCIUM, MG, PHOS in the last 168 hours. Liver Function Tests: No results for input(s): AST, ALT, ALKPHOS, BILITOT, PROT, ALBUMIN in the last 168 hours. No results for input(s): LIPASE, AMYLASE in the last 168 hours. No results for input(s): AMMONIA in the last 168 hours. CBC: No results for input(s): WBC, NEUTROABS, HGB, HCT, MCV, PLT in the last 168 hours. Cardiac Enzymes: No results for input(s): CKTOTAL, CKMB, CKMBINDEX, TROPONINI in the last 168 hours.  BNP (last 3 results) No results for input(s): PROBNP in the last 8760 hours. CBG: No results for input(s): GLUCAP in the last 168 hours.  Radiological Exams on Admission: No results found.  EKG: Independently reviewed.  Assessment/Plan Principal Problem:   CAP (community acquired pneumonia) Active Problems:   Esophageal adenocarcinoma   Sepsis with hypotension   DM2 (diabetes mellitus, type 2)   Thrombocytopenia   1. CAP - No inpatient admissions or SNF admissions in past 3 months per patient.  She is no longer on chemotherapy for her esophageal cancer. 1. Rocephin and azithromycin 2. Cultures pending 2. Sepsis with  hypotension 1. BP improved after IVF 2. Continue IVF at 75 cc/hr 3. Holding home BP meds, which it looks like she is off of anyhow according to the med list sent over from [place]. 3. H/o PE - will hold Xarelto given the thrombocytopenia, instead use SCDs for DVT ppx.  PE was in October of last year and she had been on Xarelto since then. 4. Esophageal adenocarcinoma - stage 3, s/p neoadjuvant chemo and radiation, did not end up having surgery done due to frail status.  Has been improving overall and just got PEG out recently and  has had small amount of weight gain recently.   Code Status: Full Code  Family Communication: Family at bedside Disposition Plan: Admit to inpatient   Time spent: 70 min  Aum Caggiano M. [facility] Pager [phone]  If 7AM-7PM, please contact the day team taking care of the patient [email or web address] Password [facility] 11/30/2014, 5:19 AM

## 2014-12-01 DIAGNOSIS — R0789 Other chest pain: Secondary | ICD-10-CM

## 2014-12-01 DIAGNOSIS — R1310 Dysphagia, unspecified: Secondary | ICD-10-CM

## 2014-12-01 LAB — CBC
HEMATOCRIT: 37 % (ref 36.0–46.0)
Hemoglobin: 12.5 g/dL (ref 12.0–15.0)
MCH: 32 pg (ref 26.0–34.0)
MCHC: 33.8 g/dL (ref 30.0–36.0)
MCV: 94.6 fL (ref 78.0–100.0)
Platelets: 66 10*3/uL — ABNORMAL LOW (ref 150–400)
RBC: 3.91 MIL/uL (ref 3.87–5.11)
RDW: 13.6 % (ref 11.5–15.5)
WBC: 5.4 10*3/uL (ref 4.0–10.5)

## 2014-12-01 LAB — BASIC METABOLIC PANEL
Anion gap: 9 (ref 5–15)
BUN: 8 mg/dL (ref 6–23)
CHLORIDE: 111 mmol/L (ref 96–112)
CO2: 18 mmol/L — AB (ref 19–32)
CREATININE: 0.88 mg/dL (ref 0.50–1.10)
Calcium: 7.7 mg/dL — ABNORMAL LOW (ref 8.4–10.5)
GFR calc Af Amer: 76 mL/min — ABNORMAL LOW (ref >90)
GFR calc non Af Amer: 66 mL/min — ABNORMAL LOW (ref >90)
GLUCOSE: 158 mg/dL — AB (ref 70–99)
Potassium: 3.5 mmol/L (ref 3.5–5.1)
SODIUM: 138 mmol/L (ref 135–145)

## 2014-12-01 LAB — LEGIONELLA ANTIGEN, URINE

## 2014-12-01 LAB — HIV ANTIBODY (ROUTINE TESTING W REFLEX): HIV Screen 4th Generation wRfx: NONREACTIVE

## 2014-12-01 LAB — HEMOGLOBIN A1C
Hgb A1c MFr Bld: 13.3 % — ABNORMAL HIGH (ref 4.8–5.6)
MEAN PLASMA GLUCOSE: 335 mg/dL

## 2014-12-01 LAB — GLUCOSE, CAPILLARY
Glucose-Capillary: 182 mg/dL — ABNORMAL HIGH (ref 70–99)
Glucose-Capillary: 158 mg/dL — ABNORMAL HIGH (ref 70–99)
Glucose-Capillary: 189 mg/dL — ABNORMAL HIGH (ref 70–99)
Glucose-Capillary: 162 mg/dL — ABNORMAL HIGH (ref 70–99)

## 2014-12-01 MED ORDER — LIVING WELL WITH DIABETES BOOK
Freq: Once | Status: AC
  Administered 2014-12-01: 14:00:00
  Filled 2014-12-01: qty 1

## 2014-12-01 MED ORDER — RIVAROXABAN 20 MG PO TABS
20.0000 mg | ORAL_TABLET | Freq: Every day | ORAL | Status: DC
  Administered 2014-12-01: 20 mg via ORAL
  Filled 2014-12-01 (×2): qty 1

## 2014-12-01 MED ORDER — MUPIROCIN CALCIUM 2 % EX CREA
TOPICAL_CREAM | Freq: Three times a day (TID) | CUTANEOUS | Status: DC
  Administered 2014-12-01 – 2014-12-03 (×6): via TOPICAL
  Administered 2014-12-03: 1 via TOPICAL
  Administered 2014-12-03 – 2014-12-07 (×11): via TOPICAL
  Filled 2014-12-01: qty 15

## 2014-12-01 MED ORDER — INSULIN GLARGINE 100 UNIT/ML ~~LOC~~ SOLN
10.0000 [IU] | Freq: Every day | SUBCUTANEOUS | Status: DC
  Administered 2014-12-01: 5 [IU] via SUBCUTANEOUS
  Administered 2014-12-02 – 2014-12-06 (×5): 10 [IU] via SUBCUTANEOUS
  Filled 2014-12-01 (×8): qty 0.1

## 2014-12-01 MED ORDER — INSULIN STARTER KIT- SYRINGES (ENGLISH)
1.0000 | Freq: Once | Status: AC
  Administered 2014-12-01: 1
  Filled 2014-12-01: qty 1

## 2014-12-01 NOTE — Progress Notes (Signed)
ANTICOAGULATION CONSULT NOTE - Initial Consult  Pharmacy Consult for Xarelto Indication: History of PE  No Known Allergies  Patient Measurements: Height: 5\' 2"  (157.5 cm) Weight: 157 lb 3 oz (71.3 kg) IBW/kg (Calculated) : 50.1  Vital Signs: Temp: 99.2 F (37.3 C) (02/03 1453) Temp Source: Oral (02/03 1453) BP: 115/57 mmHg (02/03 1453) Pulse Rate: 82 (02/03 1453)  Labs:  Recent Labs  11/30/14 0644 12/01/14 0504  HGB 12.0 12.5  HCT 35.7* 37.0  PLT 61* 66*  CREATININE 0.84 0.88    Estimated Creatinine Clearance: 56.6 mL/min (by C-G formula based on Cr of 0.88).   Medical History: Past Medical History  Diagnosis Date  . Diabetes mellitus without complication   . Chronic kidney disease     renal..STAGE 2  . Hypertension   . Mechanical dysphagia     MOSTLY SOLIDS  . Pulmonary emboli 9/223/15 [place]    RIGHT LOWER LOBE PER CT CHEST   . Patient on combined chemotherapy and radiation     [facility]  . Cancer 03/30/14    GE JUNCTION    Medications:  Scheduled:  . antiseptic oral rinse  7 mL Mouth Rinse BID  . azithromycin  500 mg Oral Daily  . cefTRIAXone (ROCEPHIN)  IV  1 g Intravenous Q24H  . feeding supplement (RESOURCE BREEZE)  1 Container Oral TID BM  . gabapentin  300 mg Oral QID  . Influenza vac split quadrivalent PF  0.5 mL Intramuscular Tomorrow-1000  . insulin aspart  0-15 Units Subcutaneous TID WC  . insulin glargine  10 Units Subcutaneous QHS  . mupirocin cream   Topical TID  . pantoprazole  80 mg Oral Daily  . pravastatin  20 mg Oral q1800  . rivaroxaban  20 mg Oral Q supper   Infusions:  . sodium chloride 30 mL/hr at 12/01/14 1312   PRN: HYDROcodone-acetaminophen, ondansetron  Assessment: 69 yo female with esophageal adenocarcinoma, diabetes, dyslipidemia who was admitted 2/2 with sepsis and pneumonia. She is on chronic Xarelto 20mg  daily for history of PE diagnosed Sept 2015 - this was held  on admission due to thrombocytopenia (plts 60k).  Today 2/3, MD ordered to resume Xarelto per Pharmacy as platelets have improved to 66k and no evidence of active bleeding.  Goal of Therapy:   Full dose anticoagulation Monitor platelets by anticoagulation protocol: Yes   Plan:   Resume Xarelto 20mg  daily with supper  Monitor platelets closely  Watch for signs/symptoms of bleeding  Peggyann Juba, PharmD, BCPS Pager: [phone] 12/01/2014,5:46 PM

## 2014-12-01 NOTE — Progress Notes (Signed)
Inpatient Diabetes Program Recommendations  AACE/ADA: New Consensus Statement on Inpatient Glycemic Control (2013)  Target Ranges:  Prepandial:   less than 140 mg/dL      Peak postprandial:   less than 180 mg/dL (1-2 hours)      Critically ill patients:  140 - 180 mg/dL   Reason for Visit: Diabetes Consult  Diabetes history: DM2 Outpatient Diabetes medications: None Current orders for Inpatient glycemic control: Lantus 10 QHS, Novolog moderate tidwc  Results for DORTHIE, SANTINI (MRN [id number]) as of 12/01/2014 18:08  Ref. Range 11/30/2014 17:24 11/30/2014 20:53 12/01/2014 07:49 12/01/2014 11:26 12/01/2014 17:32  Glucose-Capillary Latest Range: 70-99 mg/dL 161 (H) 104 (H) 182 (H) 158 (H) 189 (H)  Results for JOHNSIE, MOSCOSO (MRN [id number]) as of 12/01/2014 18:08  Ref. Range 11/30/2014 06:44  Hgb A1c MFr Bld Latest Range: 4.8-5.6 % 13.3 (H)   Blood sugars trending well today. To begin Lantus 10 units QHS.  Spoke with patient about diabete. Discussed A1C results (13.3%) and explained what an A1C is, basic pathophysiology of DM Type 2, basic home care, importance of checking CBGs and maintaining good CBG control to prevent long-term and short-term complications. Discussed impact of nutrition, exercise, stress, sickness, and medications on diabetes control. Discussed carbohydrates, carbohydrate goals per day and meal, along with portion sizes. Patient verbalized understanding of information discussed and she states that she has no further questions at this time related to diabetes.  Ordered Living Well book and encouraged pt to view diabetes videos on pt ed channel. Ordered Insulin Starter Kit for Pen - pt prefers pen vs syringe.  Will f/u in am. Thank you. Lorenda Peck, RD, LDN, CDE Inpatient Diabetes Coordinator [phone]

## 2014-12-01 NOTE — Progress Notes (Addendum)
Patient ID: Sharon Price, female   DOB: April 28, 1946, 69 y.o.   MRN: [id number]  [facility] PROGRESS NOTE  Sharon Price JJH:[id number] DOB: Aug 11, 1946 DOA: 11/30/2014 PCP: Lillard Anes, MD  Brief narrative:    69 year old female with past medical history of esophageal adenocarcinoma, diabetes, dyslipidemia who presented to [facility] from [place] ED with progressive generalized weakness, cough, shortness of breath for past 1 week prior to this admission. Work up in ED revealed left lower lobe pneumonia as seen on CXR. She was hypotensive with SBP 70's to 110's with 3 L IVF. Lactic acid was 1.8. She was started on rocpehin and azithromycin and admitted for further management of pneumonia and possible sepsis   Assessment/Plan:    Principal Problem: Acute respiratory failure with hypoxia / CAP (community acquired pneumonia) - patient with oxygen saturation of 93% with [place] oxygen support. She was found to have LLL pneumonia on CXR in [facility]  - she was started on rocephin and azithromycin, will continue here day #2/7 - follow up blood culture results, HIV, legionella and strep pneumonia results  - respiratory status remains stable, oxygen saturation 94% on RA  Active Problems: Sepsis secondary to pneumonia - sepsis criteria met on admission with hypotension, tachypnea, fever. Lactic acid 1.8 on admission.  - patient started on rocephin and azithromycin as noted above, clinically stable this AM - denies chest pain or shortness of breath  - follow up blood culture results, HIV, legionella and strep pneumonia results   Diabetes mellitus with complications of neuropathy - A1c > 13 - continue SSI for now, add lantus 10 U QHS - diabetic educator consulted for assistance and continuing education  - continue gabapentin for neuropathy   Esophageal adenocarcinoma - The patient has completed neoadjuvant chemotherapy radiation with weekly carboplatin and and paclltaxel in  AUG 2015. Of note, she had treatment course complicated with severe dehydration and poor nutrition, requiring a PEG tube to be placed which has become infected and had to be replaced several times. Also she had CT scan 07/21/2014 and noted to have evidence of pulmonary emboli, she was on Lovenox and then switched to xarelto but this AC on hold due to thrombocytopenia. - Plt in 60K this AM  Thrombocytopenia - secondary to history of malignancy and sequela of chemotherapy - repeat CBC in AM  Esophageal burning and non bloody vomiting - Unclear etiology, possibly esophagitis from radiation therapy, questionable progression of esophageal adenocarcinoma - pt explains she had endoscopy scheduled for today over at [facility] to let me know the name of the physician who was planning on endoscopy - GI team consulted here at [facility] for further assistance  Protein calorie malnutrition, severe - in the context of chronic illness - continue nutritional supplementation   Dyslipidemia - resume statin therapy  DVT prophylaxis - was initially on SCD but will resume Xarelto per pharmacy due to hx of PE  Code Status: Full.  Family Communication: plan of care discussed with the patient, family updated over the phone, my personal phone number provided  Disposition Plan: Awaiting GI team evaluation, possible discharge in 24-48 hours if no endoscopy planned for inpatient  IV access:  Peripheral IV  Procedures and diagnostic studies:   CXR from [facility] Consultants:  None   Other Consultants:  None   IAnti-Infectives:   Azithromycin 11/30/2014 --> Rocephin 11/30/2014 -->  Faye Ramsay, MD  [facility] Pager [phone]  If 7PM-7AM, please contact night-coverage [email or web address]  Password [email or web address] 12/01/2014, 1:05 PM   LOS: 1 day   HPI/Subjective: No events overnight. Patient reports feeling better, denies dyspnea and chest pain, persistent  esophageal burning with nonbloody vomiting.  Objective: Filed Vitals:   11/30/14 0800 11/30/14 1328 11/30/14 2055 12/01/14 0451  BP: 103/58 109/63 102/52 106/52  Pulse: 69 78 76 89  Temp: 98.5 F (36.9 C) 98.5 F (36.9 C) 99.5 F (37.5 C) 98.7 F (37.1 C)  TempSrc: Oral Oral Oral Oral  Resp: 22 20 20 20   Height:  5' 2"  (1.575 m)    Weight:  71.3 kg (157 lb 3 oz)    SpO2: 95% 97% 98% 95%    Intake/Output Summary (Last 24 hours) at 12/01/14 1305 Last data filed at 12/01/14 7353  Gross per 24 hour  Intake 2302.5 ml  Output      0 ml  Net 2302.5 ml    Exam:   General:  Pt is alert, follows commands appropriately, not in acute distress  Cardiovascular: Regular rate and rhythm, S1/S2, no rubs, no gallops  Respiratory: Clear to auscultation bilaterally, mild rhonchi at bases  Abdomen: Soft, non tender, non distended, bowel sounds present, no guarding  Extremities: No edema, pulses DP and PT palpable bilaterally  Neuro: Grossly nonfocal  Data Reviewed: Basic Metabolic Panel:  Recent Labs Lab 11/30/14 0644 12/01/14 0504  NA 134* 138  K 3.7 3.5  CL 110 111  CO2 18* 18*  GLUCOSE 275* 158*  BUN 11 8  CREATININE 0.84 0.88  CALCIUM 7.0* 7.7*   CBC:  Recent Labs Lab 11/30/14 0644 12/01/14 0504  WBC 5.0 5.4  HGB 12.0 12.5  HCT 35.7* 37.0  MCV 93.7 94.6  PLT 61* 66*   CBG:  Recent Labs Lab 11/30/14 1144 11/30/14 1724 11/30/14 2053 12/01/14 0749 12/01/14 1126  GLUCAP 171* 161* 104* 182* 158*    Recent Results (from the past 240 hour(s))  MRSA PCR Screening     Status: None   Collection Time: 11/30/14  6:34 AM  Result Value Ref Range Status   MRSA by PCR NEGATIVE NEGATIVE Final  Culture, blood (routine x 2) Call MD if unable to obtain prior to antibiotics being given     Status: None (Preliminary result)   Collection Time: 11/30/14  6:40 AM  Result Value Ref Range Status   Specimen Description BLOOD LEFT ARM  Final   Culture   Final            BLOOD CULTURE RECEIVED NO GROWTH TO DATE CULTURE WILL BE HELD FOR 5 DAYS BEFORE ISSUING A FINAL NEGATIVE REPORT   Report Status PENDING  Incomplete  Culture, blood (routine x 2) Call MD if unable to obtain prior to antibiotics being given     Status: None (Preliminary result)   Collection Time: 11/30/14  6:44 AM  Result Value Ref Range Status   Specimen Description BLOOD LEFT HAND  Final   Culture   Final           BLOOD CULTURE RECEIVED NO GROWTH TO DATE CULTURE WILL BE HELD FOR 5 DAYS BEFORE ISSUING A FINAL NEGATIVE REPORT   Report Status PENDING  Incomplete    Scheduled Meds: . azithromycin  500 mg Oral Daily  . cefTRIAXone  IV  1 g Intravenous Q24H  . gabapentin  300 mg Oral QID  . insulin aspart  0-15 Units Subcutaneous TID WC  . pantoprazole  80 mg Oral Daily  . pravastatin  20 mg Oral q1800  Continuous Infusions: . sodium chloride 75 mL/hr at 12/01/14 0702

## 2014-12-01 NOTE — Consult Note (Signed)
Consultation  Referring Provider:    [facility]  Primary Care Physician:  Lillard Anes, MD Primary Gastroenterologist: Kyra Leyland, M.D. Reason for Consultation:  Esophageal discomfort.             HPI:   Sharon Price is a 69 y.o. 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 She completed chemoradiation in September.  Patient came to ED yesterday because blood sugar at home was >400. In ED she was found to LLL PNA. Patient reports burning in esophagus over the last two weeks. She had esophageal discomfort following chemoxrt but it got much better and patient was able to remove gtube and eat again. Two weeks ago she drank contrast for follow up CT scan. Since then the esophageal discomfort has been much worse. It is uncomfortable to eat. She has been on BID PPI even prior to cancer. She has been on Carafate for 4-5 months. No recent antibiotics. No steroids. She does have diabetes. Patient told by Oncologist that she was disease free based on scan.    Past Medical History  Diagnosis Date  . Diabetes mellitus without complication   . Chronic kidney disease     renal..STAGE 2  . Hypertension   . Mechanical dysphagia     MOSTLY SOLIDS  . Pulmonary emboli 9/223/15 [place]    RIGHT LOWER LOBE PER CT CHEST   . Patient on combined chemotherapy and radiation     [facility]  . Cancer 03/30/14    GE JUNCTION    Past Surgical History  Procedure Laterality Date  . Back surgery    . Abdominal hysterectomy    . Tubal ligation    . Eus N/A 04/08/2014    Procedure: ESOPHAGEAL ENDOSCOPIC ULTRASOUND (EUS) RADIAL;  Surgeon: Milus Banister, MD;  Location: [facility] ENDOSCOPY;  Service: Endoscopy;  Laterality: N/A;  . Appendectomy    . Spine surgery    . Lumbar fusion      Family History  Problem Relation Age of Onset  . Diabetes Mother   . Hypertension Mother   . Stroke Mother   . Heart disease  Mother   . Hypertension Father   . Diabetes Sister   . Cancer Sister     THYROID  . Cancer Brother     LUNG  . Diabetes Daughter   . Diabetes Son      History  Substance Use Topics  . Smoking status: Light Tobacco Smoker  . Smokeless tobacco: Not on file  . Alcohol Use: No    Prior to Admission medications   Medication Sig Start Date End Date Taking? Authorizing Provider  dextromethorphan-guaiFENesin (ROBITUSSIN-DM) 10-100 MG/5ML liquid Take 5 mLs by mouth every 4 (four) hours as needed for cough.   Yes Historical Provider, MD  gabapentin (NEURONTIN) 300 MG capsule Take 300 mg by mouth 4 (four) times daily.   Yes Historical Provider, MD  omeprazole (PRILOSEC) 20 MG capsule Take 20 mg by mouth 2 (two) times daily before a meal.   Yes Historical Provider, MD  ondansetron (ZOFRAN) 4 MG tablet Take 4 mg by mouth every 6 (six) hours as needed for nausea or vomiting.   Yes Historical Provider, MD  pravastatin (PRAVACHOL) 20 MG tablet Take 20 mg by mouth every morning.   Yes Historical Provider, MD  rivaroxaban (XARELTO) 20 MG TABS tablet Take 20 mg by mouth daily with supper.   Yes Historical Provider, MD  Docusate Sodium (COLACE PO) Take by mouth.    Historical Provider, MD  HYDROcodone-acetaminophen (NORCO/VICODIN) 5-325 MG per tablet Take 1 tablet by mouth every 4 (four) hours as needed for moderate pain.    Historical Provider, MD  lisinopril (PRINIVIL,ZESTRIL) 40 MG tablet Take 40 mg by mouth daily.    Historical Provider, MD  LORazepam (ATIVAN) 0.5 MG tablet Take 0.5 mg by mouth.    Historical Provider, MD  metoCLOPramide (REGLAN) 10 MG tablet Take 10 mg by mouth 4 (four) times daily.    Historical Provider, MD  sucralfate (CARAFATE) 1 G tablet Take 1 g by mouth 4 (four) times daily.    Historical Provider, MD    Current Facility-Administered Medications  Medication Dose Route Frequency Provider Last Rate Last Dose  . 0.9 %  sodium chloride infusion   Intravenous Continuous Etta Quill, DO 75 mL/hr at 12/01/14 0702    . antiseptic oral rinse (CPC / CETYLPYRIDINIUM CHLORIDE 0.05%) solution 7 mL  7 mL Mouth Rinse BID Etta Quill, DO   7 mL at 12/01/14 1116  . azithromycin (ZITHROMAX) tablet 500 mg  500 mg Oral Daily Etta Quill, DO   500 mg at 12/01/14 1115  . cefTRIAXone (ROCEPHIN) 1 g in dextrose 5 % 50 mL IVPB  1 g Intravenous Q24H Randall K Absher, RPH   1 g at 11/30/14 2126  . feeding supplement (RESOURCE BREEZE) (RESOURCE BREEZE) liquid 1 Container  1 Container Oral TID BM Dorann Ou, RD   1 Container at 11/30/14 2051  . gabapentin (NEURONTIN) capsule 300 mg  300 mg Oral QID Etta Quill, DO   300 mg at 12/01/14 1116  . HYDROcodone-acetaminophen (NORCO/VICODIN) 5-325 MG per tablet 1 tablet  1 tablet Oral Q4H PRN Theodis Blaze, MD   1 tablet at 12/01/14 1119  . Influenza vac split quadrivalent PF (FLUARIX) injection 0.5 mL  0.5 mL Intramuscular Tomorrow-1000 Etta Quill, DO   0.5 mL at 12/01/14 1119  . insulin aspart (novoLOG) injection 0-15 Units  0-15 Units Subcutaneous TID WC Etta Quill, DO   2 Units at 12/01/14 1220  . ondansetron (ZOFRAN) tablet 4 mg  4 mg Oral Q6H PRN Etta Quill, DO   4 mg at 11/30/14 1439  . pantoprazole (PROTONIX) EC tablet 80 mg  80 mg Oral Daily Etta Quill, DO   80 mg at 12/01/14 1115  . pravastatin (PRAVACHOL) tablet 20 mg  20 mg Oral q1800 Etta Quill, DO   20 mg at 11/30/14 1732    Allergies as of 11/30/2014  . (No Known Allergies)     Review of Systems:    All systems reviewed and negative except where noted in HPI.   Physical Exam:  Vital signs in last 24 hours: Temp:  [98.5 F (36.9 C)-99.5 F (37.5 C)] 98.7 F (37.1 C) (02/03 0451) Pulse Rate:  [76-89] 89 (02/03 0451) Resp:  [20] 20 (02/03 0451) BP: (102-109)/(52-63) 106/52 mmHg (02/03 0451) SpO2:  [95 %-98 %] 95 % (02/03 0451) Weight:  [157 lb 3 oz (71.3 kg)] 157 lb 3 oz (71.3 kg) (02/02 1328) Last BM Date: 11/30/14 General:    Pleasant white female in NAD Head:  Normocephalic and atraumatic. Eyes:   No icterus.   Conjunctiva pink. Ears:  Normal auditory acuity. Neck:  Supple; no masses felt Mouth: no obvious candida Lungs:  Respirations even and unlabored. Lungs clear to auscultation bilaterally.   Decreased breath sound  in LLL   Heart:  Regular rate and rhythm;  murmur heard. Abdomen:  Soft, nondistended, nontender. Normal bowel sounds. No appreciable masses or hepatomegaly. Small amount of purulent drainage from old gtube site Rectal:  Not performed.  Msk:  Symmetrical without gross deformities.  Extremities:  Without edema. Neurologic:  Alert and  oriented x4;  grossly normal neurologically. Skin:  Intact without significant lesions or rashes. Cervical Nodes:  No significant cervical adenopathy. Psych:  Alert and cooperative. Normal affect.  LAB RESULTS:  Recent Labs  11/30/14 0644 12/01/14 0504  WBC 5.0 5.4  HGB 12.0 12.5  HCT 35.7* 37.0  PLT 61* 66*   BMET  Recent Labs  11/30/14 0644 12/01/14 0504  NA 134* 138  K 3.7 3.5  CL 110 111  CO2 18* 18*  GLUCOSE 275* 158*  BUN 11 8  CREATININE 0.84 0.88  CALCIUM 7.0* 7.7*    PREVIOUS ENDOSCOPIES:            EUS June 2015 Malignant mass in distal esophagus. The mass was circumferential, patially obstructing but I was able to fairly easily advance echoendoscope through the strictured lumen. The proximal edge was 34 cm from the incisors and distal edge (just below the GE junction) was at 39 cm. The mass is 5cm long. EUS findings: 1. The mass above corresponded with a hypoechoic, heterogeneous mass that clearly passes into and through the muscularis propria layer of the esophageal wall (uT3). 2. There were two round, well demarcated, 5-65mm, hypoechoic paraesophageal lymphnodes that lay directly adjacent to the mass that are suspicious for malignant involvement (uN1). 3. No celiac adenopathy.   Impression / Plan:   64. 69 year old female  with history of distal esophageal cancer, s/p chemoxrt completed in September 2015. Per patient she is disease free based on a surveillance scan two weeks ago  2. Esophageal discomfort, worse with meals. Etiology not clear. She had similar discomfort just after completion of chemoxrt. She could have radiation esophagitis. Though no obvious candidiasis she could still have candida esophagitis, especially given uncontrolled blood sugars (hgbA1C 13).  Pill esophagitis another consideration  Treat empirically with Diflucan  Most likely will proceed with am EGD  Continue PPI  3. History of PE, on Xarelto  4. Thrombocytopenia. Platelets only 66, surprising since last chemo was in September.  5. Ostomy drainage. Small amount of purulent drainage from site of previous Gtube. Will add mupirocin ointment    5. Tobacco use  Thanks   LOS: 1 day   Tye Savoy  12/01/2014, 12:56 PM

## 2014-12-02 ENCOUNTER — Encounter (HOSPITAL_COMMUNITY)
Admission: AD | Disposition: A | Discharge status: Home Health | Payer: Self-pay | Source: Other Acute Inpatient Hospital | Attending: Internal Medicine

## 2014-12-02 ENCOUNTER — Encounter (HOSPITAL_COMMUNITY): Payer: Self-pay

## 2014-12-02 ENCOUNTER — Inpatient Hospital Stay (HOSPITAL_COMMUNITY): Payer: Commercial Managed Care - PPO | Admitting: Registered Nurse

## 2014-12-02 DIAGNOSIS — K209 Esophagitis, unspecified without bleeding: Secondary | ICD-10-CM | POA: Insufficient documentation

## 2014-12-02 DIAGNOSIS — K2210 Ulcer of esophagus without bleeding: Secondary | ICD-10-CM | POA: Insufficient documentation

## 2014-12-02 HISTORY — PX: ESOPHAGOGASTRODUODENOSCOPY (EGD) WITH PROPOFOL: SHX5813

## 2014-12-02 LAB — GLUCOSE, CAPILLARY
Glucose-Capillary: 166 mg/dL — ABNORMAL HIGH (ref 70–99)
Glucose-Capillary: 114 mg/dL — ABNORMAL HIGH (ref 70–99)
Glucose-Capillary: 179 mg/dL — ABNORMAL HIGH (ref 70–99)
Glucose-Capillary: 174 mg/dL — ABNORMAL HIGH (ref 70–99)

## 2014-12-02 LAB — BASIC METABOLIC PANEL
ANION GAP: 8 (ref 5–15)
BUN: 8 mg/dL (ref 6–23)
CO2: 21 mmol/L (ref 19–32)
Calcium: 7.9 mg/dL — ABNORMAL LOW (ref 8.4–10.5)
Chloride: 110 mmol/L (ref 96–112)
Creatinine, Ser: 0.82 mg/dL (ref 0.50–1.10)
GFR calc Af Amer: 83 mL/min — ABNORMAL LOW (ref >90)
GFR calc non Af Amer: 72 mL/min — ABNORMAL LOW (ref >90)
Glucose, Bld: 189 mg/dL — ABNORMAL HIGH (ref 70–99)
Potassium: 3.5 mmol/L (ref 3.5–5.1)
SODIUM: 139 mmol/L (ref 135–145)

## 2014-12-02 LAB — CBC
HEMATOCRIT: 36.8 % (ref 36.0–46.0)
HEMOGLOBIN: 12.4 g/dL (ref 12.0–15.0)
MCH: 31.7 pg (ref 26.0–34.0)
MCHC: 33.7 g/dL (ref 30.0–36.0)
MCV: 94.1 fL (ref 78.0–100.0)
PLATELETS: 69 10*3/uL — AB (ref 150–400)
RBC: 3.91 MIL/uL (ref 3.87–5.11)
RDW: 13.6 % (ref 11.5–15.5)
WBC: 5.6 10*3/uL (ref 4.0–10.5)

## 2014-12-02 SURGERY — ESOPHAGOGASTRODUODENOSCOPY (EGD) WITH PROPOFOL
Anesthesia: Monitor Anesthesia Care

## 2014-12-02 MED ORDER — FLUCONAZOLE IN SODIUM CHLORIDE 200-0.9 MG/100ML-% IV SOLN
200.0000 mg | Freq: Every day | INTRAVENOUS | Status: DC
  Administered 2014-12-02 – 2014-12-06 (×6): 200 mg via INTRAVENOUS
  Filled 2014-12-02 (×6): qty 100

## 2014-12-02 MED ORDER — PROPOFOL 10 MG/ML IV BOLUS
INTRAVENOUS | Status: AC
  Filled 2014-12-02: qty 20

## 2014-12-02 MED ORDER — SUCRALFATE 1 GM/10ML PO SUSP
1.0000 g | Freq: Three times a day (TID) | ORAL | Status: DC
  Administered 2014-12-02 – 2014-12-07 (×20): 1 g via ORAL
  Filled 2014-12-02 (×21): qty 10

## 2014-12-02 MED ORDER — EPHEDRINE SULFATE 50 MG/ML IJ SOLN
INTRAMUSCULAR | Status: DC | PRN
  Administered 2014-12-02: 5 mg via INTRAVENOUS

## 2014-12-02 MED ORDER — LIDOCAINE HCL (CARDIAC) 20 MG/ML IV SOLN
INTRAVENOUS | Status: AC
  Filled 2014-12-02: qty 5

## 2014-12-02 MED ORDER — LACTATED RINGERS IV SOLN
INTRAVENOUS | Status: DC | PRN
  Administered 2014-12-02: 11:00:00 via INTRAVENOUS

## 2014-12-02 MED ORDER — BUTAMBEN-TETRACAINE-BENZOCAINE 2-2-14 % EX AERO
INHALATION_SPRAY | CUTANEOUS | Status: DC | PRN
  Administered 2014-12-02: 22 via TOPICAL

## 2014-12-02 MED ORDER — LIDOCAINE HCL (CARDIAC) 20 MG/ML IV SOLN
INTRAVENOUS | Status: DC | PRN
  Administered 2014-12-02: 100 mg via INTRAVENOUS

## 2014-12-02 MED ORDER — PANTOPRAZOLE SODIUM 40 MG PO TBEC
40.0000 mg | DELAYED_RELEASE_TABLET | Freq: Two times a day (BID) | ORAL | Status: DC
  Administered 2014-12-02 – 2014-12-07 (×10): 40 mg via ORAL
  Filled 2014-12-02 (×10): qty 1

## 2014-12-02 MED ORDER — ONDANSETRON HCL 4 MG/2ML IJ SOLN
4.0000 mg | Freq: Four times a day (QID) | INTRAMUSCULAR | Status: DC | PRN
  Administered 2014-12-02 – 2014-12-03 (×3): 4 mg via INTRAVENOUS
  Filled 2014-12-02 (×3): qty 2

## 2014-12-02 MED ORDER — PROPOFOL INFUSION 10 MG/ML OPTIME
INTRAVENOUS | Status: DC | PRN
  Administered 2014-12-02: 120 ug/kg/min via INTRAVENOUS

## 2014-12-02 SURGICAL SUPPLY — 14 items

## 2014-12-02 NOTE — Anesthesia Preprocedure Evaluation (Addendum)
Anesthesia Evaluation  Patient identified by MRN, date of birth, ID band Patient awake    Reviewed: Allergy & Precautions, NPO status , Patient's Chart, lab work & pertinent test results  Airway Mallampati: III  TM Distance: >3 FB Neck ROM: Full    Dental  (+) Edentulous Upper, Edentulous Lower   Pulmonary pneumonia -, Current Smoker,          Cardiovascular hypertension, Pt. on medications + Peripheral Vascular Disease (Hx PE)     Neuro/Psych negative neurological ROS     GI/Hepatic negative GI ROS, Neg liver ROS,   Endo/Other  diabetes, Type 2, Oral Hypoglycemic Agents  Renal/GU negative Renal ROS     Musculoskeletal   Abdominal   Peds  Hematology negative hematology ROS (+)   Anesthesia Other Findings   Reproductive/Obstetrics                           Anesthesia Physical Anesthesia Plan  ASA: III  Anesthesia Plan: MAC   Post-op Pain Management:    Induction: Intravenous  Airway Management Planned: Nasal Cannula and Natural Airway  Additional Equipment:   Intra-op Plan:   Post-operative Plan:   Informed Consent: I have reviewed the patients History and Physical, chart, labs and discussed the procedure including the risks, benefits and alternatives for the proposed anesthesia with the patient or authorized representative who has indicated his/her understanding and acceptance.     Plan Discussed with: CRNA  Anesthesia Plan Comments:         Anesthesia Quick Evaluation

## 2014-12-02 NOTE — Transfer of Care (Signed)
Immediate Anesthesia Transfer of Care Note  Patient: Sharon Price  Procedure(s) Performed: Procedure(s): ESOPHAGOGASTRODUODENOSCOPY (EGD) WITH PROPOFOL (N/A)  Patient Location: PACU and Endoscopy Unit  Anesthesia Type:MAC  Level of Consciousness: awake, alert , oriented and patient cooperative  Airway & Oxygen Therapy: Patient Spontanous Breathing and Patient connected to nasal cannula oxygen  Post-op Assessment: Report given to RN, Post -op Vital signs reviewed and stable and Patient moving all extremities  Post vital signs: Reviewed and stable  Last Vitals:  Filed Vitals:   12/02/14 1145  BP: 127/60  Pulse:   Temp:   Resp: 15    Complications: No apparent anesthesia complications

## 2014-12-02 NOTE — Progress Notes (Signed)
Inpatient Diabetes Program Recommendations  AACE/ADA: New Consensus Statement on Inpatient Glycemic Control (2013)  Target Ranges:  Prepandial:   less than 140 mg/dL      Peak postprandial:   less than 180 mg/dL (1-2 hours)      Critically ill patients:  140 - 180 mg/dL   Results for DAWNETTA, COPENHAVER (MRN [id number]) as of 12/02/2014 10:10  Ref. Range 12/01/2014 07:49 12/01/2014 11:26 12/01/2014 17:32 12/01/2014 21:21 12/02/2014 07:41  Glucose-Capillary Latest Range: 70-99 mg/dL 182 (H) 158 (H) 189 (H) 162 (H) 166 (H)   Excellent blood sugar control. Received Lantus 5 units QHS last night. NPO after MN for procedure.  Will continue to follow. Will likely need to go home on small dose of Lantus.  Thank you. Lorenda Peck, RD, LDN, CDE Inpatient Diabetes Coordinator [phone]

## 2014-12-02 NOTE — Op Note (Signed)
[place], [id number]   ENDOSCOPY PROCEDURE REPORT  PATIENT: Sharon Price, Sharon Price  MR#: [id number] BIRTHDATE: 10-08-1946 , 29  yrs. old GENDER: female ENDOSCOPIST: Jerene Bears, MD REFERRED BY:  [facility] PROCEDURE DATE:  12/02/2014 PROCEDURE:  EGD, diagnostic ASA CLASS:     Class III INDICATIONS:  chest pain, odynophagia, history of distal esophageal adenocarcinoma. MEDICATIONS: Monitored anesthesia care and Per Anesthesia TOPICAL ANESTHETIC: none  DESCRIPTION OF PROCEDURE: After the risks benefits and alternatives of the procedure were thoroughly explained, informed consent was obtained.  The Pentax Gastroscope [id number] endoscope was introduced through the mouth and advanced to the second portion of the duodenum , Without limitations.  The instrument was slowly withdrawn as the mucosa was fully examined.      ESOPHAGUS: Severe erosive and ulcerative esophagitis was found in the middle third of the esophagus and lower third esophagus. Esophagitis was LA Class D: Mucosal breaks involving more than 75% of esophageal circumference.  Esophagitis likely relates to radiation, possibly reflux and cannot exclude Candida.  A large non-bleeding and clean-based ulcer was found at the gastroesophageal junction.   The ulcer and esophagus were not biopsied given anticoagulation (Xarelto)  STOMACH: Congested gastritis (inflammation) was found in the gastric body.   Former PEG site/scar visible in the gastric body  DUODENUM: The duodenal mucosa showed no abnormalities in the bulb and 2nd part of the duodenum.  Retroflexed views revealed ulcer as previously described.     The scope was then withdrawn from the patient and the procedure completed.  COMPLICATIONS: There were no immediate complications.   ENDOSCOPIC IMPRESSION: 1.   Severe esophagitis in the middle third of the esophagus and lower third esophagus 2.   Large ulcer was found  at the gastroesophageal junction 3.   Gastritis (inflammation) was found in the gastric body 4.   The duodenal mucosa showed no abnormalities in the bulb and 2nd part of the duodenum  RECOMMENDATIONS: 1.  Twice a day PPI, 4 times daily liquid Carafate, and reflux precautions 2.  Repeat upper endoscopy in approximately 4 weeks off anticoagulation for biopsies should this be necessary 3.  If unable to maintain nutrition then may have to consider replacing PEG  eSigned:  Jerene Bears, MD 12/02/2014 12:15 PM    CC: the patient  PATIENT NAME:  Sharon Price, Sharon Price MR#: [id number]

## 2014-12-02 NOTE — Anesthesia Postprocedure Evaluation (Signed)
  Anesthesia Post-op Note  Patient: Sharon Price  Procedure(s) Performed: Procedure(s): ESOPHAGOGASTRODUODENOSCOPY (EGD) WITH PROPOFOL (N/A)  Patient Location: PACU  Anesthesia Type:MAC  Level of Consciousness: awake and alert   Airway and Oxygen Therapy: Patient Spontanous Breathing  Post-op Pain: none  Post-op Assessment: Post-op Vital signs reviewed  Post-op Vital Signs: Reviewed  Last Vitals:  Filed Vitals:   12/02/14 1306  BP: 102/51  Pulse: 82  Temp: 37.2 C  Resp:     Complications: No apparent anesthesia complications

## 2014-12-02 NOTE — Progress Notes (Signed)
Patient ID: Sharon Price, female   DOB: 12/02/1945, 69 y.o.   MRN: [id number]  [facility] PROGRESS NOTE  KOLLYNS MICKELSON HRC:[id number] DOB: [birth year] DOA: 11/30/2014 PCP: Lillard Anes, MD  Brief narrative:    69 year old female with past medical history of esophageal adenocarcinoma, diabetes, dyslipidemia who presented to [facility] from [place] ED with progressive generalized weakness, cough, shortness of breath for past 1 week prior to this admission. Work up in ED revealed left lower lobe pneumonia as seen on CXR. She was hypotensive with SBP 70's to 110's with 3 L IVF. Lactic acid was 1.8. She was started on rocpehin and azithromycin and admitted for further management of pneumonia and possible sepsis   Assessment/Plan:    Principal Problem: Acute respiratory failure with hypoxia / CAP (community acquired pneumonia) - patient with oxygen saturation of 93% with [place] oxygen support. She was found to have LLL pneumonia on CXR in [facility]  - she was started on rocephin and azithromycin, will continue here day #3/7 - blood culture results, HIV, legionella and strep pneumonia results negative to date  - respiratory status remains stable, oxygen saturation 94% on RA  Active Problems: Sepsis secondary to pneumonia - sepsis criteria met on admission with hypotension, tachypnea, fever. Lactic acid 1.8 on admission.  - patient started on rocephin and azithromycin as noted above, clinically stable this AM - continue ABX as noted above  - denies chest pain or shortness of breath this AM  Diabetes mellitus with complications of neuropathy - A1c > 13 - continue SSI for now, good control on lantus 10 U QHS - diabetic educator consulted for assistance, appreciate input  - continue gabapentin for neuropathy   Esophageal adenocarcinoma - The patient has completed neoadjuvant chemotherapy radiation with weekly carboplatin and and paclltaxel in AUG 2015. Of note, she had  treatment course complicated with severe dehydration and poor nutrition, requiring a PEG tube to be placed which has become infected and had to be replaced several times. Also she had CT scan 07/21/2014 and noted to have evidence of pulmonary emboli, she was on Lovenox and then switched to xarelto but this AC on hold due to thrombocytopenia and now evidence of gastric ulcer on EGD - may need PEG tube placement if oral intake does not improve   Thrombocytopenia - secondary to history of malignancy and sequela of chemotherapy - Plt still in 60 K range  - hold Xarelto  - repeat CBC in AM  Esophageal burning and non bloody vomiting - per EGD, severe esophagitis in the middle third of the esophagus and lower third esophagus, large ulcer at the GE junction - PPI BID, hold Xarelto for now - will need repeat EGD in 4 weeks   Protein calorie malnutrition, severe - in the context of chronic illness - continue nutritional supplementation  - if no improvement, will need PEG placement   Dyslipidemia - resume statin therapy  DVT prophylaxis - change to SCD's as noted above   Code Status: Full.  Family Communication: plan of care discussed with the patient, family updated over the phone, my personal phone number provided  Disposition Plan: Possible d/c in 1-2 days   IV access:  Peripheral IV  Procedures and diagnostic studies:   CXR from [facility]   EGD Dr. Hilarie Fredrickson 2/4 --> IMPRESSIONS:  1. Severe esophagitis in the middle third of the esophagus and lower third esophagus 2. Large ulcer was found at the gastroesophageal junction 3. Gastritis (inflammation) was found  in the gastric body 4. The duodenal mucosa showed no abnormalities in the bulb and 2nd part of the duodenum  RECOMMENDATIONS: 1. Twice a day PPI, 4 times daily liquid Carafate, and reflux precautions 2. Repeat upper endoscopy in approximately 4 weeks off anticoagulation for biopsies should this be  necessary 3. If unable to maintain nutrition then may have to consider replacing PEG  Medical Consultants:  None   Other Consultants:  None   IAnti-Infectives:   Azithromycin 11/30/2014 --> Rocephin 11/30/2014 -->  Faye Ramsay, MD  [facility] Pager [phone]  If 7PM-7AM, please contact night-coverage [email or web address] Password [facility] 12/02/2014, 4:47 PM   LOS: 2 days   HPI/Subjective: No events overnight.   Objective: Filed Vitals:   12/02/14 1145 12/02/14 1200 12/02/14 1306 12/02/14 1514  BP: 127/60 90/55 102/51 109/64  Pulse:  76 82 77  Temp:   98.9 F (37.2 C) 99.1 F (37.3 C)  TempSrc:   Oral Oral  Resp: _0 Height:      Weight:      SpO2:  93% 94% 99%    Intake/Output Summary (Last 24 hours) at 12/02/14 1647 Last data filed at 12/02/14 1400  Gross per 24 hour  Intake   1370 ml  Output      0 ml  Net   1370 ml    Exam:   General:  Pt is alert, follows commands appropriately, not in acute distress  Cardiovascular: Regular rate and rhythm, no rubs, no gallops  Respiratory: Clear to auscultation bilaterally, no wheezing, mild rhonchi at bases   Abdomen: Soft, non tender, non distended, bowel sounds present, no guarding  Extremities: No edema, pulses DP and PT palpable bilaterally  Neuro: Grossly nonfocal  Data Reviewed: Basic Metabolic Panel:  Recent Labs Lab 11/30/14 0644 12/01/14 0504 12/02/14 0427  NA 134* 138 139  K 3.7 3.5 3.5  CL 110 111 110  CO2 18* 18* 21  GLUCOSE 275* 158* 189*  BUN _1 CREATININE 0.84 0.88 0.82  CALCIUM 7.0* 7.7* 7.9*   CBC:  Recent Labs Lab 11/30/14 0644 12/01/14 0504 12/02/14 0427  WBC 5.0 5.4 5.6  HGB 12.0 12.5 12.4  HCT 35.7* 37.0 36.8  MCV 93.7 94.6 94.1  PLT 61* 66* 69*   CBG:  Recent Labs Lab 12/01/14 1126 12/01/14 1732 12/01/14 2121 12/02/14 0741 12/02/14 1311  GLUCAP 158* 189* 162* 166* 114*    Recent Results (from the past 240 hour(s))  MRSA PCR Screening      Status: None   Collection Time: 11/30/14  6:34 AM  Result Value Ref Range Status   MRSA by PCR NEGATIVE NEGATIVE Final  Culture, blood (routine x 2) Call MD if unable to obtain prior to antibiotics being given     Status: None (Preliminary result)   Collection Time: 11/30/14  6:40 AM  Result Value Ref Range Status   Specimen Description BLOOD LEFT ARM  Final   Special Requests BOTTLES DRAWN AEROBIC AND ANAEROBIC 5CC  Final   Culture   Final           BLOOD CULTURE RECEIVED NO GROWTH TO DATE CULTURE WILL BE HELD FOR 5 DAYS BEFORE ISSUING A FINAL NEGATIVE REPORT Performed at [facility]    Report Status PENDING  Incomplete  Culture, blood (routine x 2) Call MD if unable to obtain prior to antibiotics being given     Status: None (Preliminary result)   Collection Time: 11/30/14  6:44 AM  Result Value Ref Range Status   Specimen Description BLOOD LEFT HAND  Final   Special Requests BOTTLES DRAWN AEROBIC AND ANAEROBIC 5CC  Final   Culture   Final           BLOOD CULTURE RECEIVED NO GROWTH TO DATE CULTURE WILL BE HELD FOR 5 DAYS BEFORE ISSUING A FINAL NEGATIVE REPORT Performed at [facility]    Report Status PENDING  Incomplete     Scheduled Meds: . antiseptic oral rinse  7 mL Mouth Rinse BID  . azithromycin  500 mg Oral Daily  . cefTRIAXone (ROCEPHIN)  IV  1 g Intravenous Q24H  . feeding supplement (RESOURCE BREEZE)  1 Container Oral TID BM  . fluconazole (DIFLUCAN) IV  200 mg Intravenous QHS  . gabapentin  300 mg Oral QID  . Influenza vac split quadrivalent PF  0.5 mL Intramuscular Tomorrow-1000  . insulin aspart  0-15 Units Subcutaneous TID WC  . insulin glargine  10 Units Subcutaneous QHS  . mupirocin cream   Topical TID  . pantoprazole  40 mg Oral BID AC  . pravastatin  20 mg Oral q1800  . sucralfate  1 g Oral TID AC & HS   Continuous Infusions: . sodium chloride 30 mL/hr at 12/02/14 1300

## 2014-12-02 NOTE — Anesthesia Procedure Notes (Signed)
Procedure Name: MAC Date/Time: 12/02/2014 11:29 AM Performed by: Carleene Cooper A Pre-anesthesia Checklist: Patient identified, Timeout performed, Emergency Drugs available, Suction available and Patient being monitored Oxygen Delivery Method: Nasal cannula Dental Injury: Teeth and Oropharynx as per pre-operative assessment

## 2014-12-03 ENCOUNTER — Encounter (HOSPITAL_COMMUNITY): Payer: Self-pay | Admitting: Internal Medicine

## 2014-12-03 ENCOUNTER — Telehealth: Payer: Self-pay | Admitting: *Deleted

## 2014-12-03 LAB — BASIC METABOLIC PANEL
Anion gap: 8 (ref 5–15)
BUN: 7 mg/dL (ref 6–23)
CO2: 23 mmol/L (ref 19–32)
Calcium: 7.9 mg/dL — ABNORMAL LOW (ref 8.4–10.5)
Chloride: 108 mmol/L (ref 96–112)
Creatinine, Ser: 0.76 mg/dL (ref 0.50–1.10)
GFR calc Af Amer: >90 mL/min (ref >90)
GFR calc non Af Amer: 85 mL/min — ABNORMAL LOW (ref >90)
GLUCOSE: 188 mg/dL — AB (ref 70–99)
Potassium: 3.4 mmol/L — ABNORMAL LOW (ref 3.5–5.1)
SODIUM: 139 mmol/L (ref 135–145)

## 2014-12-03 LAB — CBC
HCT: 35.3 % — ABNORMAL LOW (ref 36.0–46.0)
HEMOGLOBIN: 12 g/dL (ref 12.0–15.0)
MCH: 31.8 pg (ref 26.0–34.0)
MCHC: 34 g/dL (ref 30.0–36.0)
MCV: 93.6 fL (ref 78.0–100.0)
Platelets: 78 10*3/uL — ABNORMAL LOW (ref 150–400)
RBC: 3.77 MIL/uL — ABNORMAL LOW (ref 3.87–5.11)
RDW: 13.8 % (ref 11.5–15.5)
WBC: 5.6 10*3/uL (ref 4.0–10.5)

## 2014-12-03 LAB — GLUCOSE, CAPILLARY
GLUCOSE-CAPILLARY: 120 mg/dL — AB (ref 70–99)
GLUCOSE-CAPILLARY: 172 mg/dL — AB (ref 70–99)
Glucose-Capillary: 171 mg/dL — ABNORMAL HIGH (ref 70–99)
Glucose-Capillary: 216 mg/dL — ABNORMAL HIGH (ref 70–99)

## 2014-12-03 MED ORDER — POTASSIUM CHLORIDE CRYS ER 20 MEQ PO TBCR
40.0000 meq | EXTENDED_RELEASE_TABLET | Freq: Once | ORAL | Status: AC
  Administered 2014-12-03: 40 meq via ORAL
  Filled 2014-12-03: qty 2

## 2014-12-03 MED ORDER — GUAIFENESIN-DM 100-10 MG/5ML PO SYRP
5.0000 mL | ORAL_SOLUTION | ORAL | Status: DC | PRN
  Administered 2014-12-03 – 2014-12-06 (×5): 5 mL via ORAL
  Filled 2014-12-03 (×5): qty 10

## 2014-12-03 NOTE — Progress Notes (Signed)
Patient ID: Sharon Price, female   DOB: 25-Mar-1946, 69 y.o.   MRN: [id number]   [facility] PROGRESS NOTE  Sharon Price BJY:[id number] DOB: 1946-07-05 DOA: 11/30/2014 PCP: Lillard Anes, MD   Brief narrative:    69 year old female with past medical history of esophageal adenocarcinoma, diabetes, dyslipidemia who presented to [facility] from [place] [facility] with progressive generalized weakness, cough, shortness of breath for past 1 week prior to this admission. Work up in ED revealed left lower lobe pneumonia as seen on CXR. She was hypotensive with SBP 70's to 110's with 3 L IVF. Lactic acid was 1.8. She was started on rocpehin and azithromycin and admitted for further management of pneumonia and possible sepsis   Assessment/Plan:    Principal Problem: Acute respiratory failure with hypoxia / CAP (community acquired pneumonia) - patient with oxygen saturation of 93% with [place] oxygen support. She was found to have LLL pneumonia on CXR in [facility]  - she was started on rocephin and azithromycin, will continue here day #4/7 - blood culture results, HIV, legionella and strep pneumonia results negative to date  - respiratory status remains stable, oxygen saturation 94% on RA  Active Problems: Sepsis secondary to pneumonia - sepsis criteria met on admission with hypotension, tachypnea, fever. Lactic acid 1.8 on admission.  - patient started on rocephin and azithromycin as noted above, clinically stable this AM - continue ABX as noted above  - denies chest pain or shortness of breath this AM  Diabetes mellitus with complications of neuropathy - A1c > 13 - continue SSI for now, good control on lantus 10 U QHS - diabetic educator consulted for assistance, appreciate input  - continue gabapentin for neuropathy   Esophageal adenocarcinoma - The patient has completed neoadjuvant chemotherapy radiation with weekly carboplatin and and paclltaxel in AUG 2015. Of note,  she had treatment course complicated with severe dehydration and poor nutrition, requiring a PEG tube to be placed which has become infected and had to be replaced several times. Also she had CT scan 07/21/2014 and noted to have evidence of pulmonary emboli, she was on Lovenox and then switched to xarelto but this AC on hold due to thrombocytopenia and now evidence of gastric ulcer on EGD - refused PEG placement at this time but wants to see how she does with oral intake   Thrombocytopenia - secondary to history of malignancy and sequela of chemotherapy - Plt slowly trending up  - hold Xarelto  - repeat CBC in AM  Esophageal burning and non bloody vomiting - per EGD, severe esophagitis in the middle third of the esophagus and lower third esophagus, large ulcer at the GE junction - PPI BID, hold Xarelto for now - will need repeat EGD in 4 weeks   Protein calorie malnutrition, severe - in the context of chronic illness - continue nutritional supplementation   Dyslipidemia - resume statin therapy  DVT prophylaxis - changed to SCD's as noted above   Code Status: Full.  Family Communication: plan of care discussed with the patient, family updated over the phone, my personal phone number provided  Disposition Plan: Possible d/c in AM  IV access:  Peripheral IV  Procedures and diagnostic studies:   CXR from [facility]   EGD Dr. Hilarie Fredrickson 2/4 --> IMPRESSIONS:  1. Severe esophagitis in the middle third of the esophagus and lower third esophagus 2. Large ulcer was found at the gastroesophageal junction 3. Gastritis (inflammation) was found in the gastric body 4. The  duodenal mucosa showed no abnormalities in the bulb and 2nd part of the duodenum  RECOMMENDATIONS: 1. Twice a day PPI, 4 times daily liquid Carafate, and reflux precautions 2. Repeat upper endoscopy in approximately 4 weeks off anticoagulation for biopsies should this be necessary 3. If unable  to maintain nutrition then may have to consider replacing PEG  Medical Consultants:  None   Other Consultants:  None   IAnti-Infectives:   Azithromycin 11/30/2014 --> Rocephin 11/30/2014 -->   Faye Ramsay, MD  [facility] Pager [phone]  If 7PM-7AM, please contact night-coverage [email or web address] Password [facility] 12/03/2014, 3:34 PM   LOS: 3 days   HPI/Subjective: No events overnight.   Objective: Filed Vitals:   12/02/14 1514 12/02/14 2045 12/03/14 0446 12/03/14 1337  BP: 109/64 109/61 97/52 129/69  Pulse: 77 80 74 76  Temp: 99.1 F (37.3 C) 99.8 F (37.7 C) 99.2 F (37.3 C) 98.2 F (36.8 C)  TempSrc: Oral Oral Oral Oral  Resp: 18 20 18 18   Height:      Weight:      SpO2: 99% 93% 92% 95%    Intake/Output Summary (Last 24 hours) at 12/03/14 1534 Last data filed at 12/03/14 1300  Gross per 24 hour  Intake    388 ml  Output    650 ml  Net   -262 ml    Exam:   General:  Pt is alert, follows commands appropriately, not in acute distress  Cardiovascular: Regular rate and rhythm, no rubs, no gallops  Respiratory: Clear to auscultation bilaterally, no wheezing, mild rales at bases   Abdomen: Soft, non tender, non distended, bowel sounds present, no guarding  Extremities: No edema, pulses DP and PT palpable bilaterally  Neuro: Grossly nonfocal  Data Reviewed: Basic Metabolic Panel:  Recent Labs Lab 11/30/14 0644 12/01/14 0504 12/02/14 0427 12/03/14 0530  NA 134* 138 139 139  K 3.7 3.5 3.5 3.4*  CL 110 111 110 108  CO2 18* 18* 21 23  GLUCOSE 275* 158* 189* 188*  BUN 11 8 8 7   CREATININE 0.84 0.88 0.82 0.76  CALCIUM 7.0* 7.7* 7.9* 7.9*   CBC:  Recent Labs Lab 11/30/14 0644 12/01/14 0504 12/02/14 0427 12/03/14 0530  WBC 5.0 5.4 5.6 5.6  HGB 12.0 12.5 12.4 12.0  HCT 35.7* 37.0 36.8 35.3*  MCV 93.7 94.6 94.1 93.6  PLT 61* 66* 69* 78*   CBG:  Recent Labs Lab 12/02/14 1311 12/02/14 1710 12/02/14 2041 12/03/14 0724  12/03/14 1234  GLUCAP 114* 179* 174* 171* 216*    Recent Results (from the past 240 hour(s))  MRSA PCR Screening     Status: None   Collection Time: 11/30/14  6:34 AM  Result Value Ref Range Status   MRSA by PCR NEGATIVE NEGATIVE Final    Comment:        The GeneXpert MRSA Assay (FDA approved for NASAL specimens only), is one component of a comprehensive MRSA colonization surveillance program. It is not intended to diagnose MRSA infection nor to guide or monitor treatment for MRSA infections.   Culture, blood (routine x 2) Call MD if unable to obtain prior to antibiotics being given     Status: None (Preliminary result)   Collection Time: 11/30/14  6:40 AM  Result Value Ref Range Status   Specimen Description BLOOD LEFT ARM  Final   Special Requests BOTTLES DRAWN AEROBIC AND ANAEROBIC 5CC  Final   Culture   Final  BLOOD CULTURE RECEIVED NO GROWTH TO DATE CULTURE WILL BE HELD FOR 5 DAYS BEFORE ISSUING A FINAL NEGATIVE REPORT Performed at [facility]    Report Status PENDING  Incomplete  Culture, blood (routine x 2) Call MD if unable to obtain prior to antibiotics being given     Status: None (Preliminary result)   Collection Time: 11/30/14  6:44 AM  Result Value Ref Range Status   Specimen Description BLOOD LEFT HAND  Final   Special Requests BOTTLES DRAWN AEROBIC AND ANAEROBIC 5CC  Final   Culture   Final           BLOOD CULTURE RECEIVED NO GROWTH TO DATE CULTURE WILL BE HELD FOR 5 DAYS BEFORE ISSUING A FINAL NEGATIVE REPORT Performed at [facility]    Report Status PENDING  Incomplete     Scheduled Meds: . antiseptic oral rinse  7 mL Mouth Rinse BID  . azithromycin  500 mg Oral Daily  . cefTRIAXone (ROCEPHIN)  IV  1 g Intravenous Q24H  . feeding supplement (RESOURCE BREEZE)  1 Container Oral TID BM  . fluconazole (DIFLUCAN) IV  200 mg Intravenous QHS  . gabapentin  300 mg Oral QID  . insulin aspart  0-15 Units Subcutaneous TID WC  .  insulin glargine  10 Units Subcutaneous QHS  . mupirocin cream   Topical TID  . pantoprazole  40 mg Oral BID AC  . pravastatin  20 mg Oral q1800  . sucralfate  1 g Oral TID AC & HS   Continuous Infusions: . sodium chloride 30 mL/hr at 12/02/14 1300

## 2014-12-03 NOTE — Progress Notes (Signed)
Inpatient Diabetes Program Recommendations  AACE/ADA: New Consensus Statement on Inpatient Glycemic Control (2013)  Target Ranges:  Prepandial:   less than 140 mg/dL      Peak postprandial:   less than 180 mg/dL (1-2 hours)      Critically ill patients:  140 - 180 mg/dL   Reason for Visit: Hyperglycemia  Results for ALIANI, CACCAVALE (MRN [id number]) as of 12/03/2014 15:01  Ref. Range 12/03/2014 07:24 12/03/2014 12:34  Glucose-Capillary Latest Range: 70-99 mg/dL 171 (H) 216 (H)  Results for DOCIA, KLAR (MRN [id number]) as of 12/03/2014 15:01  Ref. Range 12/02/2014 04:27 12/03/2014 05:30  Glucose Latest Range: 70-99 mg/dL 189 (H) 188 (H)    Inpatient Diabetes Program Recommendations Insulin - Basal: Increase Lantus to 14 units QHS (71 kg x .2)  Note: Pt prefers to be discharged on insulin pen. Has been on insulin in the past. Please order glucose meter and supplies at discharge.  Thank you. Thank you. Lorenda Peck, RD, LDN, CDE Inpatient Diabetes Coordinator [phone]

## 2014-12-03 NOTE — Progress Notes (Signed)
    Progress Note   Subjective  Swallowing not as painful today   Objective   Vital signs in last 24 hours: Temp:  [98.2 F (36.8 C)-99.8 F (37.7 C)] 98.2 F (36.8 C) (02/05 1337) Pulse Rate:  [74-80] 76 (02/05 1337) Resp:  [18-20] 18 (02/05 1337) BP: (97-129)/(52-69) 129/69 mmHg (02/05 1337) SpO2:  [92 %-95 %] 95 % (02/05 1337) Last BM Date: 12/03/14 General:    white female in NAD Abdomen:  Soft, nontender and nondistended. Normal bowel sounds. Extremities:  Without edema. Neurologic:  Alert and oriented,  grossly normal neurologically. Psych:  Cooperative. Normal mood and affect. Lab Results:  Recent Labs  12/01/14 0504 12/02/14 0427 12/03/14 0530  WBC 5.4 5.6 5.6  HGB 12.5 12.4 12.0  HCT 37.0 36.8 35.3*  PLT 66* 69* 78*   BMET  Recent Labs  12/01/14 0504 12/02/14 0427 12/03/14 0530  NA 138 139 139  K 3.5 3.5 3.4*  CL 111 110 108  CO2 18* 21 23  GLUCOSE 158* 189* 188*  BUN 8 8 7   CREATININE 0.88 0.82 0.76  CALCIUM 7.7* 7.9* 7.9*      Assessment / Plan:    63. 69 year old female with history of distal esophageal cancer, s/p chemoxrt completed in September 2015. Per patient she is disease free based on a surveillance scan two weeks ago  2. Severe esophagitis mid to lower esophagus. Large GEJ ulcer. 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 Candida? Would complete 14 days of Diflucan 100mg  daily. Did not biopsy as patient on Xarelto. Increased Carafate to QID and PPI to BID. Patient plans to follow up with her own gastroenterologist Dr. Lyda Jester.  Advance diet as tolerated.     LOS: 3 days   Tye Savoy  12/03/2014, 3:55 PM

## 2014-12-03 NOTE — Telephone Encounter (Signed)
Called Dr. Crisoforo [place] office and they are closed on Friday. Will try again Monday.[phone])

## 2014-12-03 NOTE — Telephone Encounter (Signed)
-----   Message from Jerene Bears, MD sent at 12/02/2014  4:53 PM EST ----- Regarding: Fax EGD report from hospital stay Please fax EGD report to GI MD Dr. Octavia Bruckner Meiserheimer in [place] Thanks

## 2014-12-03 NOTE — Care Management Note (Addendum)
    Page 1 of 2   12/07/2014     12:40:43 PM CARE MANAGEMENT NOTE 12/07/2014  Patient:  Sharon Price, Sharon Price   Account Number:  [id number]  Date Initiated:  11/30/2014  Documentation initiated by:  DAVIS,RHONDA  Subjective/Objective Assessment:   hypotensive and lll pna txd from [facility] for further care     Action/Plan:   home when stable   Anticipated DC Date:  12/07/2014   Anticipated DC Plan:  [place] referral  NA      DC Planning Services  CM consult      [facility] Choice  NA   Choice offered to / List presented to:  C-1 Patient   DME arranged  NA      DME agency  NA     [place] arranged  HH-1 RN  [place] agency  [facility]   Status of service:  Completed, signed off Medicare Important Message given?   (If response is "NO", the following Medicare IM given date fields will be blank) Date Medicare IM given:   Medicare IM given by:   Date Additional Medicare IM given:   Additional Medicare IM given by:    Discharge Disposition:  [place]  Per UR Regulation:  Reviewed for med. necessity/level of care/duration of stay  If discussed at [place] of Stay Meetings, dates discussed:   12/07/2014    Comments:  12/07/14 Dessa Phi RN BSN NCM [place] chosen. TC Tim rep aware of HHRN/HHPT, & d/c today.  12/06/14 Dessa Phi RN BSN NCM [phone] PT-HH.Provided w/HHC agency list await choice.  12/03/14 Dessa Phi RN BSN NCM [phone] Transfer from SDU.PNA,gastric ulcer,iv abx x2, full liq diet.Nutrition following.No anticipated d/c needs.  Feb. 02 2016/Rhonda L. Rosana Hoes, RN, BSN, CCM/Case Management [place] [phone] No discharge needs present of time of review.

## 2014-12-04 LAB — BASIC METABOLIC PANEL
ANION GAP: 6 (ref 5–15)
ANION GAP: 4 — AB (ref 5–15)
BUN: 6 mg/dL (ref 6–23)
BUN: 6 mg/dL (ref 6–23)
CALCIUM: 7.9 mg/dL — AB (ref 8.4–10.5)
CHLORIDE: 110 mmol/L (ref 96–112)
CO2: 24 mmol/L (ref 19–32)
CO2: 24 mmol/L (ref 19–32)
CREATININE: 0.81 mg/dL (ref 0.50–1.10)
CREATININE: 0.77 mg/dL (ref 0.50–1.10)
Calcium: 8 mg/dL — ABNORMAL LOW (ref 8.4–10.5)
Chloride: 111 mmol/L (ref 96–112)
GFR calc Af Amer: 85 mL/min — ABNORMAL LOW (ref >90)
GFR, EST NON AFRICAN AMERICAN: 73 mL/min — AB (ref >90)
GFR, EST NON AFRICAN AMERICAN: 84 mL/min — AB (ref >90)
Glucose, Bld: 148 mg/dL — ABNORMAL HIGH (ref 70–99)
Glucose, Bld: 141 mg/dL — ABNORMAL HIGH (ref 70–99)
POTASSIUM: 3.5 mmol/L (ref 3.5–5.1)
Potassium: 3.5 mmol/L (ref 3.5–5.1)
SODIUM: 140 mmol/L (ref 135–145)
SODIUM: 139 mmol/L (ref 135–145)

## 2014-12-04 LAB — GLUCOSE, CAPILLARY
GLUCOSE-CAPILLARY: 133 mg/dL — AB (ref 70–99)
GLUCOSE-CAPILLARY: 81 mg/dL (ref 70–99)
Glucose-Capillary: 140 mg/dL — ABNORMAL HIGH (ref 70–99)
Glucose-Capillary: 156 mg/dL — ABNORMAL HIGH (ref 70–99)

## 2014-12-04 LAB — CBC
HCT: 36.9 % (ref 36.0–46.0)
Hemoglobin: 12.4 g/dL (ref 12.0–15.0)
MCH: 31.5 pg (ref 26.0–34.0)
MCHC: 33.6 g/dL (ref 30.0–36.0)
MCV: 93.7 fL (ref 78.0–100.0)
Platelets: 79 10*3/uL — ABNORMAL LOW (ref 150–400)
RBC: 3.94 MIL/uL (ref 3.87–5.11)
RDW: 13.6 % (ref 11.5–15.5)
WBC: 4.9 10*3/uL (ref 4.0–10.5)

## 2014-12-04 NOTE — Evaluation (Signed)
Physical Therapy Evaluation Patient Details Name: Sharon Price MRN: [id number] DOB: [birth year] Today's Date: 12/04/2014   History of Present Illness  69 year old female with past medical history of esophageal adenocarcinoma, diabetes, dyslipidemia and admitted for acute respiratory failure/CAP as well as per GI : Severe esophagitis mid to lower esophagus. Large GEJ ulcer. 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 Pt currently with functional limitations due to the deficits listed below (see PT Problem List).  Pt will benefit from skilled PT to increase their independence and safety with mobility to allow discharge to the venue listed below.  Pt with decreased SpO2 of 84% room air during gait, however checked against another pulse ox and reading lower by 3; RN notified.     Follow Up Recommendations Home health PT    Equipment Recommendations  None recommended by PT    Recommendations for Other Services       Precautions / Restrictions Precautions Precautions: Fall Precaution Comments: monitor sats      Mobility  Bed Mobility Overal bed mobility: Needs Assistance Bed Mobility: Supine to Sit     Supine to sit: Supervision        Transfers Overall transfer level: Needs assistance Equipment used: None Transfers: Sit to/from Stand Sit to Stand: Min assist         General transfer comment: assist to steady upon rise  Ambulation/Gait Ambulation/Gait assistance: Min assist Ambulation Distance (Feet): 120 Feet Assistive device: None Gait Pattern/deviations: Step-through pattern;Decreased stride length Gait velocity: decr   General Gait Details: slow pace, assist for steadying initially and then pt used IV pole for more support (has SPC and RW at home), agreeable to use assistive device upon d/c if still unsteady for safety, SpO2 84% on room air during gait (RN aware)  Stairs            Wheelchair  Mobility    Modified Rankin (Stroke Patients Only)       Balance                                             Pertinent Vitals/Pain Pain Assessment: No/denies pain  SpO2 at rest 95% SpO2 room air during ambulation 84% SpO2 upon return to room 88-94% (RN aware of saturations)    Home Living Family/patient expects to be discharged to:: Private residence Living Arrangements: Children Available Help at Discharge: Family (daughter and granddaughter can assist at d/c) Type of Home: House Home Access: Stairs to enter     Home Layout: One level Home Equipment: Environmental consultant - 2 wheels;Wheelchair - manual;Cane - single point      Prior Function Level of Independence: Independent               Hand Dominance        Extremity/Trunk Assessment               Lower Extremity Assessment: Generalized weakness         Communication   Communication: No difficulties  Cognition Arousal/Alertness: Awake/alert Behavior During Therapy: WFL for tasks assessed/performed Overall Cognitive Status: Within Functional Limits for tasks assessed                      General Comments      Exercises        Assessment/Plan  PT Assessment Patient needs continued PT services  PT Diagnosis Difficulty walking;Generalized weakness   PT Problem List Decreased strength;Decreased activity tolerance;Decreased mobility;Cardiopulmonary status limiting activity  PT Treatment Interventions Gait training;DME instruction;Functional mobility training;Therapeutic activities;Therapeutic exercise;Patient/family education   PT Goals (Current goals can be found in the Care Plan section) Acute Rehab PT Goals PT Goal Formulation: With patient Time For Goal Achievement: 12/11/14 Potential to Achieve Goals: Good    Frequency Min 3X/week   Barriers to discharge        Co-evaluation               End of Session   Activity Tolerance: Patient tolerated  treatment well Patient left: in chair;with call bell/phone within reach Nurse Communication: Mobility status (saturations)         Time: 3559-7416 PT Time Calculation (min) (ACUTE ONLY): 15 min   Charges:   PT Evaluation $Initial PT Evaluation Tier I: 1 Procedure     PT G Codes:        Olena Willy,KATHrine E 12/04/2014, 9:22 AM Carmelia Bake, PT, DPT 12/04/2014 Pager: [phone]

## 2014-12-04 NOTE — Progress Notes (Addendum)
Patient ID: Sharon Price, female   DOB: 1946-01-17, 69 y.o.   MRN: [id number]  [facility] PROGRESS NOTE  EMMAJEAN RATLEDGE IHK:[id number] DOB: 29-Jun-1946 DOA: 11/30/2014 PCP: Lillard Anes, MD   Brief narrative:    69 year old female with past medical history of esophageal adenocarcinoma, diabetes, dyslipidemia who presented to [facility] from [place] ED with progressive generalized weakness, cough, shortness of breath for past 1 week prior to this admission. Work up in ED revealed left lower lobe pneumonia as seen on CXR. She was hypotensive with SBP 70's to 110's with 3 L IVF. Lactic acid was 1.8. She was started on rocpehin and azithromycin and admitted for further management of pneumonia and possible sepsis   Assessment/Plan:    Principal Problem: Acute respiratory failure with hypoxia / CAP (community acquired pneumonia) - patient with oxygen saturation of 93% with [place] oxygen support. She was found to have LLL pneumonia on CXR in [facility]  - she was started on rocephin and azithromycin, will continue here day #5/7 - blood culture results, HIV, legionella and strep pneumonia results negative to date  - respiratory status remains stable, oxygen saturation 94% on RA - encourage ambulation   Active Problems: Sepsis secondary to pneumonia - sepsis criteria met on admission with hypotension, tachypnea, fever. Lactic acid 1.8 on admission.  - patient started on rocephin and azithromycin as noted above, clinically stable this AM - continue ABX as noted above  - denies chest pain or shortness of breath this AM  Diabetes mellitus with complications of neuropathy - A1c > 13 - continue SSI for now, good control on lantus 10 U QHS - diabetic educator consulted for assistance, appreciate input  - continue gabapentin for neuropathy   Esophageal adenocarcinoma - The patient has completed neoadjuvant chemotherapy radiation with weekly carboplatin and and paclltaxel  in AUG 2015. Of note, she had treatment course complicated with severe dehydration and poor nutrition, requiring a PEG tube to be placed which has become infected and had to be replaced several times. Also she had CT scan 07/21/2014 and noted to have evidence of pulmonary emboli, she was on Lovenox and then switched to xarelto but this AC on hold due to thrombocytopenia and now evidence of gastric ulcer on EGD - refused PEG placement at this time but wants to see how she does with oral intake   Thrombocytopenia - secondary to history of malignancy and sequela of chemotherapy - Plt slowly trending up  - hold Xarelto for now - Hg is stable and WNL  - repeat CBC in AM  Recent history of PE - was on Lovenox but switched to Xarelto, this is now on hold - pt at high risk of recurrent PE, DVT in the setting of malignancy but have to hold Xarelto for now due to high risk for bleeding and worsening ulcer formation   Esophageal burning and non bloody vomiting - per EGD, severe esophagitis in the middle third of the esophagus and lower third esophagus, large ulcer at the GE junction non bleeding  - PPI BID, hold Xarelto for now - will need repeat EGD in 4 weeks   Protein calorie malnutrition, severe - in the context of chronic illness - continue nutritional supplementation   Dyslipidemia - resume statin therapy  DVT prophylaxis - changed to SCD's as noted above   Code Status: Full.  Family Communication: plan of care discussed with the patient, family updated over the phone, my personal phone number provided  Disposition  Plan: resume xarelto in AM and if doing well, possible d/c Monday, HH PT  IV access:  Peripheral IV  Procedures and diagnostic studies:   CXR from [facility]   EGD Dr. Hilarie Fredrickson 2/4 --> IMPRESSIONS:  1. Severe esophagitis in the middle third of the esophagus and lower third esophagus 2. Large ulcer was found at the gastroesophageal junction 3.  Gastritis (inflammation) was found in the gastric body 4. The duodenal mucosa showed no abnormalities in the bulb and 2nd part of the duodenum  RECOMMENDATIONS: 1. Twice a day PPI, 4 times daily liquid Carafate, and reflux precautions 2. Repeat upper endoscopy in approximately 4 weeks off anticoagulation for biopsies should this be necessary 3. If unable to maintain nutrition then may have to consider replacing PEG  Medical Consultants:  None   Other Consultants:  None   IAnti-Infectives:   Azithromycin 11/30/2014 --> Rocephin 11/30/2014 -->   Faye Ramsay, MD  [facility] Pager [phone]  If 7PM-7AM, please contact night-coverage [email or web address] Password [email or web address] 12/04/2014, 2:35 PM   LOS: 4 days   HPI/Subjective: No events overnight.   Objective: Filed Vitals:   12/03/14 1337 12/03/14 2227 12/04/14 0456 12/04/14 1341  BP: 129/69 117/61 110/86 119/61  Pulse: 76 81 82 81  Temp: 98.2 F (36.8 C) 98.2 F (36.8 C) 99.2 F (37.3 C) 98.6 F (37 C)  TempSrc: Oral Oral Oral Oral  Resp: _0 Height:      Weight:      SpO2: 95% 93% 92% 96%    Intake/Output Summary (Last 24 hours) at 12/04/14 1435 Last data filed at 12/04/14 1030  Gross per 24 hour  Intake   1002 ml  Output    450 ml  Net    552 ml    Exam:   General:  Pt is alert, follows commands appropriately, not in acute distress  Cardiovascular: Regular rate and rhythm, S1/S2, no murmurs, no rubs, no gallops  Respiratory: Clear to auscultation bilaterally, no wheezing, mild rales at bases  Abdomen: Soft, non tender, non distended, bowel sounds present, no guarding  Extremities: No edema, pulses DP and PT palpable bilaterally  Neuro: Grossly nonfocal  Data Reviewed: Basic Metabolic Panel:  Recent Labs Lab 11/30/14 0644 12/01/14 0504 12/02/14 0427 12/03/14 0530 12/04/14 0420  NA 134* 138 139 139 140  K 3.7 3.5 3.5 3.4* 3.5  CL 110 111 110 108 110  CO2 18* 18* _1 GLUCOSE 275* 158* 189* 188* 148*  BUN _2 CREATININE 0.84 0.88 0.82 0.76 0.81  CALCIUM 7.0* 7.7* 7.9* 7.9* 7.9*   CBC:  Recent Labs Lab 11/30/14 0644 12/01/14 0504 12/02/14 0427 12/03/14 0530 12/04/14 0420  WBC 5.0 5.4 5.6 5.6 4.9  HGB 12.0 12.5 12.4 12.0 12.4  HCT 35.7* 37.0 36.8 35.3* 36.9  MCV 93.7 94.6 94.1 93.6 93.7  PLT 61* 66* 69* 78* 79*   CBG:  Recent Labs Lab 12/03/14 1234 12/03/14 1709 12/03/14 2222 12/04/14 0802 12/04/14 1219  GLUCAP 216* 120* 172* 133* 81    Recent Results (from the past 240 hour(s))  MRSA PCR Screening     Status: None   Collection Time: 11/30/14  6:34 AM  Result Value Ref Range Status   MRSA by PCR NEGATIVE NEGATIVE Final    Comment:        The GeneXpert MRSA Assay (FDA approved for NASAL specimens only), is one component of a comprehensive MRSA colonization surveillance program. It is  not intended to diagnose MRSA infection nor to guide or monitor treatment for MRSA infections.   Culture, blood (routine x 2) Call MD if unable to obtain prior to antibiotics being given     Status: None (Preliminary result)   Collection Time: 11/30/14  6:40 AM  Result Value Ref Range Status   Specimen Description BLOOD LEFT ARM  Final   Special Requests BOTTLES DRAWN AEROBIC AND ANAEROBIC 5CC  Final   Culture   Final           BLOOD CULTURE RECEIVED NO GROWTH TO DATE CULTURE WILL BE HELD FOR 5 DAYS BEFORE ISSUING A FINAL NEGATIVE REPORT Performed at [facility]    Report Status PENDING  Incomplete  Culture, blood (routine x 2) Call MD if unable to obtain prior to antibiotics being given     Status: None (Preliminary result)   Collection Time: 11/30/14  6:44 AM  Result Value Ref Range Status   Specimen Description BLOOD LEFT HAND  Final   Special Requests BOTTLES DRAWN AEROBIC AND ANAEROBIC 5CC  Final   Culture   Final           BLOOD CULTURE RECEIVED NO GROWTH TO DATE CULTURE WILL BE HELD FOR 5 DAYS BEFORE ISSUING  A FINAL NEGATIVE REPORT Performed at [facility]    Report Status PENDING  Incomplete     Scheduled Meds: . antiseptic oral rinse  7 mL Mouth Rinse BID  . azithromycin  500 mg Oral Daily  . cefTRIAXone (ROCEPHIN)  IV  1 g Intravenous Q24H  . feeding supplement (RESOURCE BREEZE)  1 Container Oral TID BM  . fluconazole (DIFLUCAN) IV  200 mg Intravenous QHS  . gabapentin  300 mg Oral QID  . insulin aspart  0-15 Units Subcutaneous TID WC  . insulin glargine  10 Units Subcutaneous QHS  . mupirocin cream   Topical TID  . pantoprazole  40 mg Oral BID AC  . pravastatin  20 mg Oral q1800  . sucralfate  1 g Oral TID AC & HS   Continuous Infusions: . sodium chloride 30 mL/hr at 12/03/14 1944

## 2014-12-05 LAB — CBC
HEMATOCRIT: 34.2 % — AB (ref 36.0–46.0)
HEMOGLOBIN: 11.8 g/dL — AB (ref 12.0–15.0)
MCH: 32.2 pg (ref 26.0–34.0)
MCHC: 34.5 g/dL (ref 30.0–36.0)
MCV: 93.2 fL (ref 78.0–100.0)
PLATELETS: 97 10*3/uL — AB (ref 150–400)
RBC: 3.67 MIL/uL — AB (ref 3.87–5.11)
RDW: 13.5 % (ref 11.5–15.5)
WBC: 4.8 10*3/uL (ref 4.0–10.5)

## 2014-12-05 LAB — GLUCOSE, CAPILLARY
GLUCOSE-CAPILLARY: 97 mg/dL (ref 70–99)
Glucose-Capillary: 141 mg/dL — ABNORMAL HIGH (ref 70–99)
Glucose-Capillary: 92 mg/dL (ref 70–99)
Glucose-Capillary: 148 mg/dL — ABNORMAL HIGH (ref 70–99)

## 2014-12-05 MED ORDER — SENNOSIDES 8.8 MG/5ML PO SYRP
10.0000 mL | ORAL_SOLUTION | Freq: Two times a day (BID) | ORAL | Status: DC
  Administered 2014-12-05: 10 mL via ORAL
  Filled 2014-12-05 (×5): qty 10

## 2014-12-05 MED ORDER — POLYETHYLENE GLYCOL 3350 17 G PO PACK
17.0000 g | PACK | Freq: Two times a day (BID) | ORAL | Status: DC
  Administered 2014-12-05 – 2014-12-07 (×2): 17 g via ORAL
  Filled 2014-12-05 (×5): qty 1

## 2014-12-05 NOTE — Progress Notes (Signed)
Patient ID: Sharon Price, female   DOB: 1945-12-15, 69 y.o.   MRN: [id number]  [facility] PROGRESS NOTE  Sharon Price TMH:[id number] DOB: May 07, 1946 DOA: 11/30/2014 PCP: Lillard Anes, MD  Brief narrative:    69 year old female with past medical history of esophageal adenocarcinoma, diabetes, dyslipidemia who presented to [facility] from [place] ED with progressive generalized weakness, cough, shortness of breath for past 1 week prior to this admission. Work up in ED revealed left lower lobe pneumonia as seen on CXR. She was hypotensive with SBP 70's to 110's with 3 L IVF. Lactic acid was 1.8. She was started on rocpehin and azithromycin and admitted for further management of pneumonia and possible sepsis   Assessment/Plan:    Principal Problem: Acute respiratory failure with hypoxia / CAP (community acquired pneumonia) - patient with oxygen saturation of 93% with [place] oxygen support. She was found to have LLL pneumonia on CXR in [facility]  - she was started on rocephin and azithromycin, will continue here day #6/7 - blood culture results, HIV, legionella and strep pneumonia results negative to date  - respiratory status remains stable, oxygen saturation 94% on RA - encourage ambulation   Active Problems: Sepsis secondary to pneumonia - sepsis criteria met on admission with hypotension, tachypnea, fever. Lactic acid 1.8 on admission.  - patient started on rocephin and azithromycin as noted above, clinically stable this AM and reports feeling better  - continue ABX as noted above  - denies chest pain or shortness of breath this AM  Diabetes mellitus with complications of neuropathy - A1c > 13 - continue SSI for now, good control on lantus 10 U QHS - diabetic educator consulted for assistance, appreciate input  - continue gabapentin for neuropathy   Esophageal adenocarcinoma - The patient has completed neoadjuvant chemotherapy radiation with weekly  carboplatin and and paclltaxel in AUG 2015. Of note, she had treatment course complicated with severe dehydration and poor nutrition, requiring a PEG tube to be placed which has become infected and had to be replaced several times. Also she had CT scan 07/21/2014 and noted to have evidence of pulmonary emboli, she was on Lovenox and then switched to xarelto but this AC on hold due to thrombocytopenia and now evidence of gastric ulcer on EGD - refused PEG placement at this time but wants to see how she does with oral intake  - so far, minimal oral intake, discussed some options with food choices   Thrombocytopenia - secondary to history of malignancy and sequela of chemotherapy - Plt trending up  - hold Xarelto for now - Hg is stable and WNL  - repeat CBC in AM  Recent history of PE - was on Lovenox but switched to Xarelto, this is now on hold - pt at high risk of recurrent PE, DVT in the setting of malignancy but have to hold Xarelto for now due to high risk for bleeding and worsening ulcer formation   Esophageal burning and non bloody vomiting - per EGD, severe esophagitis in the middle third of the esophagus and lower third esophagus, large ulcer at the GE junction non bleeding  - PPI BID, hold Xarelto for now - will need repeat EGD in 4 weeks   Protein calorie malnutrition, severe - in the context of chronic illness - continue nutritional supplementation   Dyslipidemia - resume statin therapy  DVT prophylaxis - changed to SCD's as noted above   Code Status: Full.  Family Communication: plan of care  discussed with the patient, family updated over the phone, my personal phone number provided  Disposition Plan: possible d/c in AM  IV access:  Peripheral IV  Procedures and diagnostic studies:   CXR from [facility]   EGD Dr. Hilarie Fredrickson 2/4 --> IMPRESSIONS:  1. Severe esophagitis in the middle third of the esophagus and lower third esophagus 2. Large  ulcer was found at the gastroesophageal junction 3. Gastritis (inflammation) was found in the gastric body 4. The duodenal mucosa showed no abnormalities in the bulb and 2nd part of the duodenum  RECOMMENDATIONS: 1. Twice a day PPI, 4 times daily liquid Carafate, and reflux precautions 2. Repeat upper endoscopy in approximately 4 weeks off anticoagulation for biopsies should this be necessary 3. If unable to maintain nutrition then may have to consider replacing PEG  Medical Consultants:  None   Other Consultants:  None   IAnti-Infectives:   Azithromycin 11/30/2014 --> Rocephin 11/30/2014 -->   Faye Ramsay, MD  [facility] Pager [phone]  If 7PM-7AM, please contact night-coverage [email or web address] Password [email or web address] 12/05/2014, 1:58 PM   LOS: 5 days   HPI/Subjective: No events overnight.   Objective: Filed Vitals:   12/04/14 1341 12/04/14 2130 12/05/14 0441 12/05/14 1351  BP: 119/61 130/67 114/63 124/63  Pulse: 81 81 82 78  Temp: 98.6 F (37 C) 99.5 F (37.5 C) 99.4 F (37.4 C) 98.4 F (36.9 C)  TempSrc: Oral Oral Oral Oral  Resp: _0 Height:      Weight:      SpO2: 96% 94% 92% 93%    Intake/Output Summary (Last 24 hours) at 12/05/14 1358 Last data filed at 12/05/14 1353  Gross per 24 hour  Intake    240 ml  Output      0 ml  Net    240 ml    Exam:   General:  Pt is alert, follows commands appropriately, not in acute distress  Cardiovascular: Regular rate and rhythm, no rubs, no gallops  Respiratory: Clear to auscultation bilaterally, no wheezing, no crackles, no rhonchi  Abdomen: Soft, non tender, non distended, bowel sounds present, no guarding  Extremities: No edema, pulses DP and PT palpable bilaterally  Neuro: Grossly nonfocal  Data Reviewed: Basic Metabolic Panel:  Recent Labs Lab 12/01/14 0504 12/02/14 0427 12/03/14 0530 12/04/14 0420 12/04/14 1500  NA 138 139 139 140 139  K 3.5 3.5 3.4* 3.5 3.5   CL 111 110 108 110 111  CO2 18* _1 GLUCOSE 158* 189* 188* 148* 141*  BUN _2 CREATININE 0.88 0.82 0.76 0.81 0.77  CALCIUM 7.7* 7.9* 7.9* 7.9* 8.0*   CBC:  Recent Labs Lab 12/01/14 0504 12/02/14 0427 12/03/14 0530 12/04/14 0420 12/05/14 0553  WBC 5.4 5.6 5.6 4.9 4.8  HGB 12.5 12.4 12.0 12.4 11.8*  HCT 37.0 36.8 35.3* 36.9 34.2*  MCV 94.6 94.1 93.6 93.7 93.2  PLT 66* 69* 78* 79* 97*   CBG:  Recent Labs Lab 12/04/14 0802 12/04/14 1219 12/04/14 1704 12/04/14 2128 12/05/14 0732  GLUCAP 133* 81 140* 156* 97    Recent Results (from the past 240 hour(s))  MRSA PCR Screening     Status: None   Collection Time: 11/30/14  6:34 AM  Result Value Ref Range Status   MRSA by PCR NEGATIVE NEGATIVE Final  Culture, blood (routine x 2) Call MD if unable to obtain prior to antibiotics being given     Status: None (Preliminary result)  Collection Time: 11/30/14  6:40 AM  Result Value Ref Range Status   Specimen Description BLOOD LEFT ARM  Final   Special Requests BOTTLES DRAWN AEROBIC AND ANAEROBIC 5CC  Final   Culture   Final           BLOOD CULTURE RECEIVED NO GROWTH TO DATE CULTURE WILL BE HELD FOR 5 DAYS BEFORE ISSUING A FINAL NEGATIVE REPORT Performed at [facility]    Report Status PENDING  Incomplete  Culture, blood (routine x 2) Call MD if unable to obtain prior to antibiotics being given     Status: None (Preliminary result)   Collection Time: 11/30/14  6:44 AM  Result Value Ref Range Status   Specimen Description BLOOD LEFT HAND  Final   Special Requests BOTTLES DRAWN AEROBIC AND ANAEROBIC 5CC  Final   Culture   Final           BLOOD CULTURE RECEIVED NO GROWTH TO DATE CULTURE WILL BE HELD FOR 5 DAYS BEFORE ISSUING A FINAL NEGATIVE REPORT Performed at [facility]    Report Status PENDING  Incomplete     Scheduled Meds: . antiseptic oral rinse  7 mL Mouth Rinse BID  . azithromycin  500 mg Oral Daily  . cefTRIAXone (ROCEPHIN)  IV   1 g Intravenous Q24H  . feeding supplement (RESOURCE BREEZE)  1 Container Oral TID BM  . fluconazole (DIFLUCAN) IV  200 mg Intravenous QHS  . gabapentin  300 mg Oral QID  . insulin aspart  0-15 Units Subcutaneous TID WC  . insulin glargine  10 Units Subcutaneous QHS  . mupirocin cream   Topical TID  . pantoprazole  40 mg Oral BID AC  . polyethylene glycol  17 g Oral BID  . pravastatin  20 mg Oral q1800  . sennosides  10 mL Oral BID  . sucralfate  1 g Oral TID AC & HS   Continuous Infusions:

## 2014-12-06 LAB — GLUCOSE, CAPILLARY: Glucose-Capillary: 94 mg/dL (ref 70–99)

## 2014-12-06 LAB — CULTURE, BLOOD (ROUTINE X 2)
CULTURE: NO GROWTH
Culture: NO GROWTH

## 2014-12-06 LAB — BASIC METABOLIC PANEL
Anion gap: 8 (ref 5–15)
BUN: 5 mg/dL — AB (ref 6–23)
CO2: 25 mmol/L (ref 19–32)
CREATININE: 0.7 mg/dL (ref 0.50–1.10)
Calcium: 8 mg/dL — ABNORMAL LOW (ref 8.4–10.5)
Chloride: 105 mmol/L (ref 96–112)
GFR calc Af Amer: >90 mL/min (ref >90)
GFR calc non Af Amer: 87 mL/min — ABNORMAL LOW (ref >90)
GLUCOSE: 115 mg/dL — AB (ref 70–99)
POTASSIUM: 3 mmol/L — AB (ref 3.5–5.1)
Sodium: 138 mmol/L (ref 135–145)

## 2014-12-06 LAB — CBC
HEMATOCRIT: 34.7 % — AB (ref 36.0–46.0)
Hemoglobin: 11.9 g/dL — ABNORMAL LOW (ref 12.0–15.0)
MCH: 31.7 pg (ref 26.0–34.0)
MCHC: 34.3 g/dL (ref 30.0–36.0)
MCV: 92.5 fL (ref 78.0–100.0)
PLATELETS: 111 10*3/uL — AB (ref 150–400)
RBC: 3.75 MIL/uL — AB (ref 3.87–5.11)
RDW: 13.3 % (ref 11.5–15.5)
WBC: 4.9 10*3/uL (ref 4.0–10.5)

## 2014-12-06 MED ORDER — RIVAROXABAN 20 MG PO TABS
20.0000 mg | ORAL_TABLET | Freq: Every day | ORAL | Status: DC
  Administered 2014-12-06: 20 mg via ORAL
  Filled 2014-12-06 (×2): qty 1

## 2014-12-06 NOTE — Telephone Encounter (Signed)
Report faxed to [phone].

## 2014-12-06 NOTE — Progress Notes (Signed)
ANTICOAGULATION CONSULT NOTE - Initial Consult  Pharmacy Consult for rivaroxaban Indication: history of pulmonary embolism  No Known Allergies  Patient Measurements: Height: 5\' 2"  (157.5 cm) Weight: 157 lb 3 oz (71.3 kg) IBW/kg (Calculated) : 50.1  Vital Signs: Temp: 99.1 F (37.3 C) (02/08 1345) Temp Source: Oral (02/08 1345) BP: 130/72 mmHg (02/08 1345) Pulse Rate: 77 (02/08 1345)  Labs:  Recent Labs  12/04/14 0420 12/04/14 1500 12/05/14 0553 12/06/14 0505  HGB 12.4  --  11.8* 11.9*  HCT 36.9  --  34.2* 34.7*  PLT 79*  --  97* 111*  CREATININE 0.81 0.77  --  0.70    Estimated Creatinine Clearance: 62.3 mL/min (by C-G formula based on Cr of 0.7).   Medical History: Past Medical History  Diagnosis Date  . Diabetes mellitus without complication   . Chronic kidney disease     renal..STAGE 2  . Hypertension   . Mechanical dysphagia     MOSTLY SOLIDS  . Pulmonary emboli 9/223/15 [place]    RIGHT LOWER LOBE PER CT CHEST   . Patient on combined chemotherapy and radiation     [facility]  . Cancer 03/30/14    GE JUNCTION    Assessment: 78 yoF admitted [facility] from [facility] ED with weakness, Cough, ShOB, and hyperglycemia. Pt with hx pulmonary embolism diagnosed in Sept 2015. Patient was on rivaroxaban 20mg  daily PTA which was held since admission in the setting of thrombocytopenia and gastric ulcer. [facility] MD has discussed with GI and ulcer is found to be non-bleeding. Pharmacy is consulted to restart rivaroxaban for Hx VTE.  Hgb stable at 11.9  Platelets up to 111k  CrCl 62 ml/min  No bleeding is noted  No major DDIs identified   Goal of Therapy:  rivaroxaban per indication and renal function Monitor platelets by anticoagulation protocol: Yes   Plan:  - rivaroxaban 20mg  PO daily, given with the largest meal of the day - CBC in AM and q72h while admitted - bmet in am - monitor for bleeding - pharmacy will  follow up daily  Thank you for the consult.  Currie Paris, PharmD, BCPS Pager: [phone] Pharmacy: [phone] 12/06/2014 5:59 PM

## 2014-12-06 NOTE — Progress Notes (Signed)
Physical Therapy Treatment Patient Details Name: Sharon Price MRN: [id number] DOB: Feb 19, 1946 Today's Date: 12/06/2014    History of Present Illness 69 year old female with past medical history of esophageal adenocarcinoma, diabetes, dyslipidemia and admitted for acute respiratory failure/CAP as well as per GI : Severe esophagitis mid to lower esophagus. Large GEJ ulcer. 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 C/o back pain-issued warm packs.    Follow Up Recommendations  Home health PT     Equipment Recommendations  None recommended by PT    Recommendations for Other Services       Precautions / Restrictions Precautions Precautions: Fall Restrictions Weight Bearing Restrictions: No    Mobility  Bed Mobility         Supine to sit: Modified independent (Device/Increase time)        Transfers       Sit to Stand: Min guard         General transfer comment: close guard for safety  Ambulation/Gait Ambulation/Gait assistance: Min assist;Min guard   Assistive device:  (hallway handrail) Gait Pattern/deviations: Decreased stride length;Step-through pattern     General Gait Details: Min assist to steady without UE support; Min guard assist when pt used hallway handrail for support. Slow gait speed.    Stairs            Wheelchair Mobility    Modified Rankin (Stroke Patients Only)       Balance Overall balance assessment: Needs assistance         Standing balance support: No upper extremity supported;During functional activity Standing balance-Leahy Scale: Fair                      Cognition Arousal/Alertness: Awake/alert Behavior During Therapy: WFL for tasks assessed/performed Overall Cognitive Status: Within Functional Limits for tasks assessed                      Exercises General Exercises - Lower Extremity Hip ABduction/ADduction:  AROM;Both;10 reps;Standing Hip Flexion/Marching: AROM;Both;10 reps;Standing Toe Raises: AROM;Both;10 reps;Standing Heel Raises: AROM;Both;10 reps;Standing Mini-Sqauts: AROM;10 reps;Standing    General Comments        Pertinent Vitals/Pain Pain Assessment: Faces Faces Pain Scale: Hurts little more Pain Location: chronic back pain Pain Descriptors / Indicators: Sore Pain Intervention(s): Heat applied;Repositioned    Home Living                      Prior Function            PT Goals (current goals can now be found in the care plan section) Progress towards PT goals: Progressing toward goals    Frequency  Min 3X/week    PT Plan Current plan remains appropriate    Co-evaluation             End of Session Equipment Utilized During Treatment: Gait belt Activity Tolerance: Patient tolerated treatment well Patient left: in chair;with call bell/phone within reach     Time: 8588-5027 PT Time Calculation (min) (ACUTE ONLY): 17 min  Charges:  $Gait Training: 8-22 mins                    G Codes:      Weston Anna, MPT Pager: [phone]

## 2014-12-06 NOTE — Progress Notes (Signed)
Patient ID: Sharon Price, female   DOB: 09-03-1946, 69 y.o.   MRN: [id number]  [facility] PROGRESS NOTE  Sharon Price DOB: 20-Oct-1946 DOA: 11/30/2014 PCP: Lillard Anes, MD  Brief narrative:    69 year old female with past medical history of esophageal adenocarcinoma, diabetes, dyslipidemia who presented to [facility] from [place] ED with progressive generalized weakness, cough, shortness of breath for past 1 week prior to this admission. Work up in ED revealed left lower lobe pneumonia as seen on CXR. She was hypotensive with SBP 70's to 110's with 3 L IVF. Lactic acid was 1.8. She was started on rocpehin and azithromycin and admitted for further management of pneumonia and possible sepsis   Assessment/Plan:    Principal Problem: Acute respiratory failure with hypoxia / CAP (community acquired pneumonia) - patient with oxygen saturation of 93% with [place] oxygen support. She was found to have LLL pneumonia on CXR in [facility]  - she was started on rocephin and azithromycin, will continue here day #7/7 - blood culture results, HIV, legionella and strep pneumonia results negative to date  - respiratory status remains stable, oxygen saturation 94% on RA - encourage ambulation   Active Problems: Sepsis secondary to pneumonia - sepsis criteria met on admission with hypotension, tachypnea, fever. Lactic acid 1.8 on admission.  - patient started on rocephin and azithromycin as noted above, clinically stable this AM and reports feeling better  - continue ABX as noted above and stop after today's dose - denies chest pain or shortness of breath this AM  Diabetes mellitus with complications of neuropathy - A1c > 13 - continue SSI for now, good control on lantus 10 U QHS - diabetic educator consulted for assistance, appreciate input  - continue gabapentin for neuropathy   Esophageal adenocarcinoma - The patient has completed neoadjuvant  chemotherapy radiation with weekly carboplatin and and paclltaxel in AUG 2015. Of note, she had treatment course complicated with severe dehydration and poor nutrition, requiring a PEG tube to be placed which has become infected and had to be replaced several times. Also she had CT scan 07/21/2014 and noted to have evidence of pulmonary emboli, she was on Lovenox and then switched to xarelto but this AC on hold due to thrombocytopenia and now evidence of gastric ulcer on EGD - refused PEG placement at this time but wants to see how she does with oral intake  - so far, minimal oral intake, discussed some options with food choices   Thrombocytopenia - secondary to history of malignancy and sequela of chemotherapy - Plt trending up  - Hg is stable and WNL  - repeat CBC in AM  Recent history of PE - was on Lovenox but switched to Xarelto, this is now on hold - pt at high risk of recurrent PE, DVT in the setting of malignancy  - d/w GI team, may resume Xarelto as ulcer is non bleeding   Esophageal burning and non bloody vomiting - per EGD, severe esophagitis in the middle third of the esophagus and lower third esophagus, large ulcer at the GE junction non bleeding  - PPI BID, d/w GI on call, may resume Xarelto - will need repeat EGD in 4 weeks    Protein calorie malnutrition, severe - in the context of chronic illness - continue nutritional supplementation   Dyslipidemia - resume statin therapy  DVT prophylaxis - changed to SCD's as noted above but will resume Xarelto today   Code Status: Full.  Family  Communication: plan of care discussed with the patient, family updated over the phone, my personal phone number provided  Disposition Plan: possible d/c in AM  IV access:  Peripheral IV  Procedures and diagnostic studies:   CXR from [facility]   EGD Dr. Hilarie Fredrickson 2/4 --> IMPRESSIONS:  1. Severe esophagitis in the middle third of the esophagus and lower  third esophagus 2. Large ulcer was found at the gastroesophageal junction 3. Gastritis (inflammation) was found in the gastric body 4. The duodenal mucosa showed no abnormalities in the bulb and 2nd part of the duodenum  RECOMMENDATIONS: 1. Twice a day PPI, 4 times daily liquid Carafate, and reflux precautions 2. Repeat upper endoscopy in approximately 4 weeks off anticoagulation for biopsies should this be necessary 3. If unable to maintain nutrition then may have to consider replacing PEG  Medical Consultants:  None   Other Consultants:  None   IAnti-Infectives:   Azithromycin 11/30/2014 --> Rocephin 11/30/2014 -->   Faye Ramsay, MD  [facility] Pager [phone]  If 7PM-7AM, please contact night-coverage [email or web address] Password [email or web address] 12/06/2014, 5:42 PM   LOS: 6 days   HPI/Subjective: No events overnight.   Objective: Filed Vitals:   12/05/14 1351 12/05/14 2017 12/06/14 0427 12/06/14 1345  BP: 124/63 142/72 129/68 130/72  Pulse: 78 88 84 77  Temp: 98.4 F (36.9 C) 99.1 F (37.3 C) 98.2 F (36.8 C) 99.1 F (37.3 C)  TempSrc: Oral Oral Oral Oral  Resp: _0 Height:      Weight:      SpO2: 93% 93% 94% 94%    Intake/Output Summary (Last 24 hours) at 12/06/14 1742 Last data filed at 12/06/14 1500  Gross per 24 hour  Intake    540 ml  Output      0 ml  Net    540 ml    Exam:   General:  Pt is alert, follows commands appropriately, not in acute distress  Cardiovascular: Regular rate and rhythm, S1/S2, no murmurs, no rubs, no gallops  Respiratory: Clear to auscultation bilaterally, no wheezing, no crackles, no rhonchi  Abdomen: Soft, non tender, non distended, bowel sounds present, no guarding  Extremities: No edema, pulses DP and PT palpable bilaterally  Neuro: Grossly nonfocal  Data Reviewed: Basic Metabolic Panel:  Recent Labs Lab 12/02/14 0427 12/03/14 0530 12/04/14 0420 12/04/14 1500 12/06/14 0505   NA 139 139 140 139 138  K 3.5 3.4* 3.5 3.5 3.0*  CL 110 108 110 111 105  CO2 _1 GLUCOSE 189* 188* 148* 141* 115*  BUN _2 5*  CREATININE 0.82 0.76 0.81 0.77 0.70  CALCIUM 7.9* 7.9* 7.9* 8.0* 8.0*   Liver Function Tests: No results for input(s): AST, ALT, ALKPHOS, BILITOT, PROT, ALBUMIN in the last 168 hours. No results for input(s): LIPASE, AMYLASE in the last 168 hours. No results for input(s): AMMONIA in the last 168 hours. CBC:  Recent Labs Lab 12/02/14 0427 12/03/14 0530 12/04/14 0420 12/05/14 0553 12/06/14 0505  WBC 5.6 5.6 4.9 4.8 4.9  HGB 12.4 12.0 12.4 11.8* 11.9*  HCT 36.8 35.3* 36.9 34.2* 34.7*  MCV 94.1 93.6 93.7 93.2 92.5  PLT 69* 78* 79* 97* 111*   Cardiac Enzymes: No results for input(s): CKTOTAL, CKMB, CKMBINDEX, TROPONINI in the last 168 hours. BNP: Invalid input(s): POCBNP CBG:  Recent Labs Lab 12/05/14 0732 12/05/14 1210 12/05/14 1704 12/05/14 2100 12/06/14 0720  GLUCAP 97 141* 92 148* 94  Recent Results (from the past 240 hour(s))  MRSA PCR Screening     Status: None   Collection Time: 11/30/14  6:34 AM  Result Value Ref Range Status   MRSA by PCR NEGATIVE NEGATIVE Final    Comment:        The GeneXpert MRSA Assay (FDA approved for NASAL specimens only), is one component of a comprehensive MRSA colonization surveillance program. It is not intended to diagnose MRSA infection nor to guide or monitor treatment for MRSA infections.   Culture, blood (routine x 2) Call MD if unable to obtain prior to antibiotics being given     Status: None   Collection Time: 11/30/14  6:40 AM  Result Value Ref Range Status   Specimen Description BLOOD LEFT ARM  Final   Special Requests BOTTLES DRAWN AEROBIC AND ANAEROBIC 5CC  Final   Culture   Final    NO GROWTH 5 DAYS Performed at [facility]    Report Status 12/06/2014 FINAL  Final  Culture, blood (routine x 2) Call MD if unable to obtain prior to antibiotics being  given     Status: None   Collection Time: 11/30/14  6:44 AM  Result Value Ref Range Status   Specimen Description BLOOD LEFT HAND  Final   Special Requests BOTTLES DRAWN AEROBIC AND ANAEROBIC 5CC  Final   Culture   Final    NO GROWTH 5 DAYS Performed at [facility]    Report Status 12/06/2014 FINAL  Final     Scheduled Meds: . antiseptic oral rinse  7 mL Mouth Rinse BID  . feeding supplement (RESOURCE BREEZE)  1 Container Oral TID BM  . fluconazole (DIFLUCAN) IV  200 mg Intravenous QHS  . gabapentin  300 mg Oral QID  . insulin aspart  0-15 Units Subcutaneous TID WC  . insulin glargine  10 Units Subcutaneous QHS  . mupirocin cream   Topical TID  . pantoprazole  40 mg Oral BID AC  . polyethylene glycol  17 g Oral BID  . pravastatin  20 mg Oral q1800  . sennosides  10 mL Oral BID  . sucralfate  1 g Oral TID AC & HS   Continuous Infusions:

## 2014-12-07 LAB — CBC
HEMATOCRIT: 36.3 % (ref 36.0–46.0)
Hemoglobin: 12 g/dL (ref 12.0–15.0)
MCH: 31 pg (ref 26.0–34.0)
MCHC: 33.1 g/dL (ref 30.0–36.0)
MCV: 93.8 fL (ref 78.0–100.0)
PLATELETS: 133 10*3/uL — AB (ref 150–400)
RBC: 3.87 MIL/uL (ref 3.87–5.11)
RDW: 13.5 % (ref 11.5–15.5)
WBC: 3.9 10*3/uL — ABNORMAL LOW (ref 4.0–10.5)

## 2014-12-07 LAB — BASIC METABOLIC PANEL
Anion gap: 8 (ref 5–15)
BUN: 6 mg/dL (ref 6–23)
CHLORIDE: 106 mmol/L (ref 96–112)
CO2: 29 mmol/L (ref 19–32)
Calcium: 8.4 mg/dL (ref 8.4–10.5)
Creatinine, Ser: 0.76 mg/dL (ref 0.50–1.10)
GFR calc Af Amer: >90 mL/min (ref >90)
GFR calc non Af Amer: 85 mL/min — ABNORMAL LOW (ref >90)
Glucose, Bld: 91 mg/dL (ref 70–99)
Potassium: 3.2 mmol/L — ABNORMAL LOW (ref 3.5–5.1)
SODIUM: 143 mmol/L (ref 135–145)

## 2014-12-07 LAB — GLUCOSE, CAPILLARY
GLUCOSE-CAPILLARY: 133 mg/dL — AB (ref 70–99)
GLUCOSE-CAPILLARY: 100 mg/dL — AB (ref 70–99)
GLUCOSE-CAPILLARY: 157 mg/dL — AB (ref 70–99)
Glucose-Capillary: 146 mg/dL — ABNORMAL HIGH (ref 70–99)
Glucose-Capillary: 133 mg/dL — ABNORMAL HIGH (ref 70–99)

## 2014-12-07 MED ORDER — GUAIFENESIN-DM 100-10 MG/5ML PO SYRP: 5.0000 mL | ORAL_SOLUTION | ORAL | Status: DC | PRN

## 2014-12-07 MED ORDER — PANTOPRAZOLE SODIUM 40 MG PO TBEC
40.0000 mg | DELAYED_RELEASE_TABLET | Freq: Two times a day (BID) | ORAL | Status: DC
Start: 2014-12-07 — End: 2015-06-17

## 2014-12-07 MED ORDER — LISINOPRIL 20 MG PO TABS: 20.0000 mg | ORAL_TABLET | Freq: Every day | ORAL | Status: DC

## 2014-12-07 MED ORDER — HYDROCODONE-ACETAMINOPHEN 5-325 MG PO TABS: 1.0000 | ORAL_TABLET | ORAL | Status: DC | PRN

## 2014-12-07 MED ORDER — LORAZEPAM 0.5 MG PO TABS: 0.5000 mg | ORAL_TABLET | Freq: Three times a day (TID) | ORAL | Status: DC | PRN

## 2014-12-07 MED ORDER — INSULIN GLARGINE 100 UNIT/ML ~~LOC~~ SOLN: 10.0000 [IU] | Freq: Every day | SUBCUTANEOUS | Status: AC

## 2014-12-07 MED ORDER — SENNOSIDES 8.8 MG/5ML PO SYRP: 10.0000 mL | ORAL_SOLUTION | Freq: Two times a day (BID) | ORAL | Status: AC

## 2014-12-07 MED ORDER — SUCRALFATE 1 GM/10ML PO SUSP: 1.0000 g | Freq: Three times a day (TID) | ORAL | Status: DC

## 2014-12-07 MED ORDER — POTASSIUM CHLORIDE 20 MEQ/15ML (10%) PO SOLN
40.0000 meq | Freq: Once | ORAL | Status: DC
  Filled 2014-12-07: qty 30

## 2014-12-07 NOTE — Discharge Summary (Signed)
Physician Discharge Summary  Sharon Price WNU:[id number] DOB: 1945-12-16 DOA: 11/30/2014  PCP: Lillard Anes, MD  Admit date: 11/30/2014 Discharge date: 12/07/2014  Recommendations for Outpatient Follow-up:  1. Pt will need to follow up with PCP in 2-3 weeks post discharge 2. Please obtain BMP to evaluate electrolytes and kidney function 3. Please also check CBC to evaluate Hg and Hct levels 4. Please note that pt was advised to continue taking Protonix BID, please see findings of EGD below 5. PT restarted on Xarelto, made aware of potential risk of bleeding and advised to call her PCP or call 911 if she develops any hematemesis, blood in urine or stool, nose bleeding  6. Pt will need repeat EGD in 4 weeks   Discharge Diagnoses:  Principal Problem:   CAP (community acquired pneumonia) Active Problems:   Esophageal adenocarcinoma   Sepsis with hypotension   DM2 (diabetes mellitus, type 2)   Thrombocytopenia   Dyslipidemia   PNA (pneumonia)   Atypical chest pain   Odynophagia   Acute esophagitis   Esophageal ulcer without bleeding   Discharge Condition: Stable  Diet recommendation: Heart healthy diet discussed in details   Brief narrative:    69 year old female with past medical history of esophageal adenocarcinoma, diabetes, dyslipidemia who presented to [facility] from [facility] ED with progressive generalized weakness, cough, shortness of breath for past 1 week prior to this admission. Work up in ED revealed left lower lobe pneumonia as seen on CXR. She was hypotensive with SBP 70's to 110's with 3 L IVF. Lactic acid was 1.8. She was started on rocpehin and azithromycin and admitted for further management of pneumonia and possible sepsis   Assessment/Plan:    Principal Problem: Acute respiratory failure with hypoxia / CAP (community acquired pneumonia) - patient with oxygen saturation of 93% with [place] oxygen support. She was found to have LLL pneumonia on CXR  in [facility]  - she was started on rocephin and azithromycin, completed 7 days of therapy with ABX  - blood culture results, HIV, legionella and strep pneumonia results negative to date  - respiratory status remains stable, oxygen saturation 94% on RA - encourage ambulation   Active Problems: Sepsis secondary to pneumonia - sepsis criteria met on admission with hypotension, tachypnea, fever. Lactic acid 1.8 on admission.  - patient started on rocephin and azithromycin as noted above, clinically stable this AM and reports feeling better  - completed ABX course while in the hospital  - denies chest pain or shortness of breath this AM  Diabetes mellitus with complications of neuropathy - A1c > 13 - continue SSI for now, good control on lantus 10 U QHS - diabetic educator consulted for assistance, appreciate input  - continue gabapentin for neuropathy   Esophageal adenocarcinoma - The patient has completed neoadjuvant chemotherapy radiation with weekly carboplatin and and paclltaxel in AUG 2015. Of note, she had treatment course complicated with severe dehydration and poor nutrition, requiring a PEG tube to be placed which has become infected and had to be replaced several times. Also she had CT scan 07/21/2014 and noted to have evidence of pulmonary emboli, she was on Lovenox and then switched to xarelto [facility] on hold initially due to thrombocytopenia and evidence of gastric ulcer on EGD - refused PEG placement at this time and wants to see how she does with oral intake  - so far, minimal oral intake, discussed some options with food choices but pt still insisting on oral route  of feeding  - xarelto resumed as the gastric ulcer on EGD was non bleeding   Thrombocytopenia - secondary to history of malignancy and sequela of chemotherapy - Plt trending up  - Hg is stable and WNL   Recent history of PE - was on Lovenox but switched to Xarelto, initially on hold  - pt at high  risk of recurrent PE, DVT in the setting of malignancy so we resumed Xarelto as ulcer is non bleeding   Esophageal burning and non bloody vomiting - per EGD, severe esophagitis in the middle third of the esophagus and lower third esophagus, large ulcer at the GE junction non bleeding  - PPI BID, d/w GI on call, may resume Xarelto - will need repeat EGD in 4 weeks   Protein calorie malnutrition, severe - in the context of chronic illness - continue nutritional supplementation   Dyslipidemia - resume statin therapy   Code Status: Full.  Family Communication: plan of care discussed with the patient, family updated over the phone, my personal phone number provided  Disposition Plan: home today   IV access:  Peripheral IV  Procedures and diagnostic studies:   CXR from [facility]   EGD Dr. Hilarie Fredrickson 2/4 --> IMPRESSIONS:  1. Severe esophagitis in the middle third of the esophagus and lower third esophagus 2. Large ulcer was found at the gastroesophageal junction 3. Gastritis (inflammation) was found in the gastric body 4. The duodenal mucosa showed no abnormalities in the bulb and 2nd part of the duodenum  RECOMMENDATIONS: 1. Twice a day PPI, 4 times daily liquid Carafate, and reflux precautions 2. Repeat upper endoscopy in approximately 4 weeks off anticoagulation for biopsies should this be necessary 3. If unable to maintain nutrition then may have to consider replacing PEG  Medical Consultants:  None   Other Consultants:  None   IAnti-Infectives:   Azithromycin 11/30/2014 --> 12/07/2014 Rocephin 11/30/2014 --> 12/07/2014       Discharge Exam: Filed Vitals:   12/07/14 0520  BP: 146/75  Pulse: 76  Temp: 98 F (36.7 C)  Resp: 16   Filed Vitals:   12/06/14 0427 12/06/14 1345 12/06/14 2110 12/07/14 0520  BP: 129/68 130/72 135/77 146/75  Pulse: 84 77 73 76  Temp: 98.2 F (36.8 C) 99.1 F (37.3 C) 98.3  F (36.8 C) 98 F (36.7 C)  TempSrc: Oral Oral Oral Oral  Resp: 20 18 16 16   Height:      Weight:      SpO2: 94% 94% 94% 93%    General: Pt is alert, follows commands appropriately, not in acute distress Cardiovascular: Regular rate and rhythm, no rubs, no gallops Respiratory: Clear to auscultation bilaterally, no wheezing, no crackles, no rhonchi Abdominal: Soft, non tender, non distended, bowel sounds +, no guarding Extremities: no edema, no cyanosis, pulses palpable bilaterally DP and PT Neuro: Grossly nonfocal  Discharge Instructions  Discharge Instructions    Diet - low sodium heart healthy    Complete by:  As directed      Increase activity slowly    Complete by:  As directed             Medication List    STOP taking these medications        COLACE PO     metoCLOPramide 10 MG tablet  Commonly known as:  REGLAN     omeprazole 20 MG capsule  Commonly known as:  PRILOSEC  Replaced by:  pantoprazole 40 MG tablet  ondansetron 4 MG tablet  Commonly known as:  ZOFRAN     sucralfate 1 G tablet  Commonly known as:  CARAFATE  Replaced by:  sucralfate 1 GM/10ML suspension      TAKE these medications        dextromethorphan-guaiFENesin 10-100 MG/5ML liquid  Commonly known as:  ROBITUSSIN-DM  Take 5 mLs by mouth every 4 (four) hours as needed for cough.     guaiFENesin-dextromethorphan 100-10 MG/5ML syrup  Commonly known as:  ROBITUSSIN DM  Take 5 mLs by mouth every 4 (four) hours as needed for cough.     gabapentin 300 MG capsule  Commonly known as:  NEURONTIN  Take 300 mg by mouth 4 (four) times daily.     HYDROcodone-acetaminophen 5-325 MG per tablet  Commonly known as:  NORCO/VICODIN  Take 1 tablet by mouth every 4 (four) hours as needed for moderate pain.     insulin glargine 100 UNIT/ML injection  Commonly known as:  LANTUS  Inject 0.1 mLs (10 Units total) into the skin at bedtime.     lisinopril 20 MG tablet  Commonly known as:   PRINIVIL,ZESTRIL  Take 1 tablet (20 mg total) by mouth daily.     LORazepam 0.5 MG tablet  Commonly known as:  ATIVAN  Take 1 tablet (0.5 mg total) by mouth every 8 (eight) hours as needed for anxiety.     pantoprazole 40 MG tablet  Commonly known as:  PROTONIX  Take 1 tablet (40 mg total) by mouth 2 (two) times daily before a meal.     pravastatin 20 MG tablet  Commonly known as:  PRAVACHOL  Take 20 mg by mouth every morning.     rivaroxaban 20 MG Tabs tablet  Commonly known as:  XARELTO  Take 20 mg by mouth daily with supper.     sennosides 8.8 MG/5ML syrup  Commonly known as:  SENOKOT  Take 10 mLs by mouth 2 (two) times daily.     sucralfate 1 GM/10ML suspension  Commonly known as:  CARAFATE  Take 10 mLs (1 g total) by mouth 4 (four) times daily -  before meals and at bedtime.           Follow-up Information    Follow up with Lillard Anes, MD.   Specialty:  Family Medicine   Contact information:   [address] [place] [address] [phone]       Follow up with Faye Ramsay, MD.   Specialty:  Internal Medicine   Why:  As needed, If symptoms worsen call my cell phone [phone]   Contact information:   [address] [place] [address] [phone]        The results of significant diagnostics from this hospitalization (including imaging, microbiology, ancillary and laboratory) are listed below for reference.     Microbiology: Recent Results (from the past 240 hour(s))  MRSA PCR Screening     Status: None   Collection Time: 11/30/14  6:34 AM  Result Value Ref Range Status   MRSA by PCR NEGATIVE NEGATIVE Final    Comment:        The GeneXpert MRSA Assay (FDA approved for NASAL specimens only), is one component of a comprehensive MRSA colonization surveillance program. It is not intended to diagnose MRSA infection nor to guide or monitor treatment for MRSA infections.   Culture, blood (routine x 2)  Call MD if unable to obtain prior to antibiotics being given  Status: None   Collection Time: 11/30/14  6:40 AM  Result Value Ref Range Status   Specimen Description BLOOD LEFT ARM  Final   Special Requests BOTTLES DRAWN AEROBIC AND ANAEROBIC 5CC  Final   Culture   Final    NO GROWTH 5 DAYS Performed at [facility]    Report Status 12/06/2014 FINAL  Final  Culture, blood (routine x 2) Call MD if unable to obtain prior to antibiotics being given     Status: None   Collection Time: 11/30/14  6:44 AM  Result Value Ref Range Status   Specimen Description BLOOD LEFT HAND  Final   Special Requests BOTTLES DRAWN AEROBIC AND ANAEROBIC 5CC  Final   Culture   Final    NO GROWTH 5 DAYS Performed at [facility]    Report Status 12/06/2014 FINAL  Final     Labs: Basic Metabolic Panel:  Recent Labs Lab 12/03/14 0530 12/04/14 0420 12/04/14 1500 12/06/14 0505 12/07/14 0530  NA 139 140 139 138 143  K 3.4* 3.5 3.5 3.0* 3.2*  CL 108 110 111 105 106  CO2 23 24 24 25 29   GLUCOSE 188* 148* 141* 115* 91  BUN 7 6 6  5* 6  CREATININE 0.76 0.81 0.77 0.70 0.76  CALCIUM 7.9* 7.9* 8.0* 8.0* 8.4   CBC:  Recent Labs Lab 12/03/14 0530 12/04/14 0420 12/05/14 0553 12/06/14 0505 12/07/14 0530  WBC 5.6 4.9 4.8 4.9 3.9*  HGB 12.0 12.4 11.8* 11.9* 12.0  HCT 35.3* 36.9 34.2* 34.7* 36.3  MCV 93.6 93.7 93.2 92.5 93.8  PLT 78* 79* 97* 111* 133*   CBG:  Recent Labs Lab 12/05/14 0732 12/05/14 1210 12/05/14 1704 12/05/14 2100 12/06/14 0720  GLUCAP 97 141* 92 148* 94   SIGNED: Time coordinating discharge: Over 30 minutes  MAGICK-Hutson Luft, MD  [facility] 12/07/2014, 11:02 AM Pager [phone]  If 7PM-7AM, please contact night-coverage [email or web address] Password [email or web address]

## 2014-12-07 NOTE — Progress Notes (Signed)
NUTRITION FOLLOW-UP       INTERVENTION: - D/c Resource Breeze po TID, each supplement provides 250 kcal and 9 grams of protein -Provide Magic cup TID with meals, each supplement provides 290 kcal and 9 grams of protein - RD will continue to monitor  NUTRITION DIAGNOSIS: Inadequate oral intake related to CAP as evidenced by poor po.   Goal: Pt to meet >/= 90% of their estimated nutrition needs   Monitor:  Weight trend, po intake, acceptance of supplements, labs  Admitting Dx: CAP (community acquired pneumonia)  ASSESSMENT: 69 y.o. female who presents to [facility] ED with 1 week history of generalized weakness, cough, SOB, BGL over 400   Spoke with pt at bedside. Pt reports not liking the Resource Breeze supplement, states it burns when she drinks it. RD to d/c order. Pt is willing to try magic cups with meals.   Pt states that she is being d/c today. Encouraged pt to continue good PO intake.  - Labs reviewed: Low K  Height: Ht Readings from Last 1 Encounters:  11/30/14 5\' 2"  (1.575 m)    Weight: Wt Readings from Last 1 Encounters:  11/30/14 157 lb 3 oz (71.3 kg)    BMI:  Body mass index is 28.74 kg/(m^2).  Estimated Nutritional Needs: Kcal: 1800-2000 Protein: 100-115 g Fluid: 1.8-2.0 L/day  Skin: puncture wound on abdomen  Diet Order: Diet full liquid  EDUCATION NEEDS: -Education needs addressed   Intake/Output Summary (Last 24 hours) at 12/07/14 0940 Last data filed at 12/06/14 2300  Gross per 24 hour  Intake    700 ml  Output      0 ml  Net    700 ml    Last BM: 2/8  Labs:   Recent Labs Lab 12/04/14 1500 12/06/14 0505 12/07/14 0530  NA 139 138 143  K 3.5 3.0* 3.2*  CL 111 105 106  CO2 24 25 29   BUN 6 5* 6  CREATININE 0.77 0.70 0.76  CALCIUM 8.0* 8.0* 8.4  GLUCOSE 141* 115* 91    CBG (last 3)   Recent Labs  12/05/14 1704 12/05/14 2100 12/06/14 0720  GLUCAP 92 148* 94    Scheduled Meds: . antiseptic oral rinse  7 mL Mouth  Rinse BID  . feeding supplement (RESOURCE BREEZE)  1 Container Oral TID BM  . fluconazole (DIFLUCAN) IV  200 mg Intravenous QHS  . gabapentin  300 mg Oral QID  . insulin aspart  0-15 Units Subcutaneous TID WC  . insulin glargine  10 Units Subcutaneous QHS  . mupirocin cream   Topical TID  . pantoprazole  40 mg Oral BID AC  . polyethylene glycol  17 g Oral BID  . pravastatin  20 mg Oral q1800  . rivaroxaban  20 mg Oral Q supper  . sennosides  10 mL Oral BID  . sucralfate  1 g Oral TID AC & HS    Continuous Infusions:     Clayton Bibles, MS, RD, LDN Pager: [phone] After Hours Pager: [phone]

## 2014-12-07 NOTE — Discharge Instructions (Signed)

## 2014-12-23 ENCOUNTER — Encounter: Payer: Self-pay | Admitting: Cardiothoracic Surgery

## 2014-12-23 ENCOUNTER — Encounter: Payer: Commercial Managed Care - PPO | Admitting: Cardiothoracic Surgery

## 2014-12-23 NOTE — Progress Notes (Signed)
[address]       [place] [address]             [place] Record #[id number] Date of Birth: [birth year]  Referring: Marice Potter, MD Primary Care: Lillard Anes, MD  Chief Complaint:    No chief complaint on file.   History of Present Illness:    Sharon Price 69 y.o. 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 The patient has completed neoadjuvant chemotherapy radiation with weekly carboplatin and and paclltaxel in AUG 2015. Her treatment course has been complicated with severe dehydration poor nutrition, requiring a PEG tube to be placed which has become infected and had to be replaced several times. The patient has been receiving supplementary IV fluids through a PICC line. Overall she is improved since the last time I saw her, and will stop her IV fluid administration tomorrow. Patient is noted on recent CT scan 07/21/2014 to have evidence of pulmonary emboli, she is now on Lovenox to be converted to xarelto  Current Activity/ Functional Status:  Patient is independent with mobility/ambulation, transfers, ADL's, IADL's.   Zubrod Score: At the time of surgery this patient's most appropriate activity status/level should be described as: []     0    Normal activity, no symptoms []     1    Restricted in physical strenuous activity but ambulatory, able to do out light work []     2    Ambulatory and capable of self care, unable to do work activities, up and about               >50 % of waking hours                              [x]     3    Only limited self care, in bed greater than 50% of waking hours []     4    Completely disabled, no self care, confined to bed or chair []     5    Moribund   Past Medical History  Diagnosis Date  . Diabetes mellitus without complication   . Chronic kidney disease      renal..STAGE 2  . Hypertension   . Mechanical dysphagia     MOSTLY SOLIDS  . Pulmonary emboli 9/223/15 [place]    RIGHT LOWER LOBE PER CT CHEST   . Patient on combined chemotherapy and radiation     [facility]  . Cancer 03/30/14    GE JUNCTION    Past Surgical History  Procedure Laterality Date  . Back surgery    . Abdominal hysterectomy    . Tubal ligation    . Eus N/A 04/08/2014    Procedure: ESOPHAGEAL ENDOSCOPIC ULTRASOUND (EUS) RADIAL;  Surgeon: Milus Banister, MD;  Location: [facility] ENDOSCOPY;  Service: Endoscopy;  Laterality: N/A;  . Appendectomy    . Spine surgery    . Lumbar fusion    . Esophagogastroduodenoscopy (egd) with propofol N/A 12/02/2014    Procedure: ESOPHAGOGASTRODUODENOSCOPY (EGD) WITH PROPOFOL;  Surgeon: Jerene Bears, MD;  Location: [facility] ENDOSCOPY;  Service: Endoscopy;  Laterality: N/A;    Family  History  Problem Relation Age of Onset  . Diabetes Mother   . Hypertension Mother   . Stroke Mother   . Heart disease Mother   . Hypertension Father   . Diabetes Sister   . Cancer Sister     THYROID  . Cancer Brother     LUNG  . Diabetes Daughter   . Diabetes Son     History   Social History  . Marital Status: Widowed    Spouse Name: N/A  . Number of Children: N/A  . Years of Education: N/A   Occupational History  . Not on file.   Social History Main Topics  . Smoking status: Light Tobacco Smoker  . Smokeless tobacco: Not on file  . Alcohol Use: No  . Drug Use: No  . Sexual Activity: Not on file   Other Topics Concern  . Not on file   Social History Narrative    History  Smoking status  . Light Tobacco Smoker  Smokeless tobacco  . Not on file    History  Alcohol Use No     No Known Allergies  Current Outpatient Prescriptions  Medication Sig Dispense Refill  . dextromethorphan-guaiFENesin (ROBITUSSIN-DM) 10-100 MG/5ML liquid Take 5 mLs by mouth every 4 (four) hours as needed for  cough.    . gabapentin (NEURONTIN) 300 MG capsule Take 300 mg by mouth 4 (four) times daily.    Marland Kitchen guaiFENesin-dextromethorphan (ROBITUSSIN DM) 100-10 MG/5ML syrup Take 5 mLs by mouth every 4 (four) hours as needed for cough. 118 mL 0  . HYDROcodone-acetaminophen (NORCO/VICODIN) 5-325 MG per tablet Take 1 tablet by mouth every 4 (four) hours as needed for moderate pain. 65 tablet 0  . insulin glargine (LANTUS) 100 UNIT/ML injection Inject 0.1 mLs (10 Units total) into the skin at bedtime. 10 mL 5  . lisinopril (PRINIVIL,ZESTRIL) 20 MG tablet Take 1 tablet (20 mg total) by mouth daily. 30 tablet 1  . LORazepam (ATIVAN) 0.5 MG tablet Take 1 tablet (0.5 mg total) by mouth every 8 (eight) hours as needed for anxiety. 30 tablet 0  . pantoprazole (PROTONIX) 40 MG tablet Take 1 tablet (40 mg total) by mouth 2 (two) times daily before a meal. 60 tablet 1  . pravastatin (PRAVACHOL) 20 MG tablet Take 20 mg by mouth every morning.    . rivaroxaban (XARELTO) 20 MG TABS tablet Take 20 mg by mouth daily with supper.    . sennosides (SENOKOT) 8.8 MG/5ML syrup Take 10 mLs by mouth 2 (two) times daily. 240 mL 5  . sucralfate (CARAFATE) 1 GM/10ML suspension Take 10 mLs (1 g total) by mouth 4 (four) times daily -  before meals and at bedtime. 420 mL 5   No current facility-administered medications for this visit.     Review of Systems:     Cardiac Review of Systems: Y or N  Chest Pain [  y  ]  Resting SOB [ n  ] Exertional SOB  [ y ]  Orthopnea [  ]   Pedal Edema [   ]    Palpitations [  ] Syncope  [  ]   Presyncope [   ]  General Review of Systems: [Y] = yes [  ]=no Constitional: recent weight change [  ];  Wt loss over the last 3 months [  40 lbs ] anorexia [  ]; fatigue [ y ]; nausea [ y ]; night sweats [  ]; fever [  ]; or chills [  ];  Dental: poor dentition[  ]; Last Dentist visit:   Eye : blurred vision [  ]; diplopia [   ]; vision changes [  ];  Amaurosis fugax[  ]; Resp: cough [  ];  wheezing[   ];  hemoptysis[  ]; shortness of breath[  ]; paroxysmal nocturnal dyspnea[  ]; dyspnea on exertion[ y ]; or orthopnea[  ];  GI:  gallstones[  ], vomiting[  ];  dysphagia[y  ]; melena[  ];  hematochezia [ y ]; heartburn[ y ];   Hx of  Colonoscopy[  ]; GU: kidney stones [  ]; hematuria[  ];   dysuria [  ];  nocturia[  ];  history of     obstruction [  ]; urinary frequency [  ]             Skin: rash, swelling[  ];, hair loss[  ];  peripheral edema[  ];  or itching[  ]; Musculosketetal: myalgias[  ];  joint swelling[  ];  joint erythema[  ];  joint pain[  ];  back pain[  ];  Heme/Lymph: bruising[  ];  bleeding[  ];  anemia[  ];  Neuro: TIA[  ];  headaches[  ];  stroke[  ];  vertigo[  ];  seizures[  ];   paresthesias[  ];  difficulty walking[ y ];  Psych:depression[  ]; anxiety[ y ];  Endocrine: diabetes[  ];  thyroid dysfunction[  ];  Immunizations: Flu up to date [ n ]; Pneumococcal up to date Florencio.Farrier  ];  Other:  Physical Exam: There were no vitals taken for this visit.  PHYSICAL EXAMINATION:  General appearance: alert, cooperative, appears older than stated age, cachectic and fatigued Neurologic: intact Heart: irregularly irregular rhythm Lungs: diminished breath sounds bibasilar Abdomen: Abdomen mildly distended with intact G-tube in the left upper abdomen Extremities: extremities normal, atraumatic, no cyanosis or edema and Homans sign is negative, no sign of DVT Wound: There appears to be no infection around the I do not appreciate cervical or supraclavicular adenopathy PEG tube is in place without surrounding infection,  Diagnostic Studies & Laboratory data:     Recent Radiology Findings:  CLINICAL DATA: Shortness of breath; history of esophageal malignancy, PEG tube placement, appendectomy, hysterectomy, and lumbar fusion.  EXAM: CT ANGIOGRAPHY CHEST  CT ABDOMEN AND PELVIS WITH CONTRAST  TECHNIQUE: Multidetector CT imaging of the chest was performed using the standard  protocol during bolus administration of intravenous contrast. Multiplanar CT image reconstructions and MIPs were obtained to evaluate the vascular anatomy. Multidetector CT imaging of the abdomen and pelvis was performed using the standard protocol during bolus administration of intravenous contrast.  CONTRAST: 100 cc of Isovue 370 intravenously. The patient also received oral contrast material through the PEG tube.  COMPARISON: Portable chest x-ray of today's date and PET-CT study of April 21, 2014 and CT scan of the chest and abdomen abdomen dated March 31, 2014  FINDINGS: CTA CHEST FINDINGS  There are small filling defects within peripheral pulmonary arterial branches to the right lower lobe. There are no filling defects elsewhere on the right nor on the left. The caliber of the thoracic aorta is normal. The cardiac chambers are normal in size. The RV LV ratio is approximately 1. There is no pericardial effusion. There is no lymphadenopathy. There is thickening of the wall of the lower 1/3 of the esophagus without visible discrete mass. There is no free mediastinal fluid or air.  At lung window settings there is no  pulmonary parenchymal mass. There is increased density in the posterior medial aspect of the right costophrenic gutter which may reflect pneumonia or pulmonary infarction new since the previous studies. There is no pleural effusion nor pulmonary parenchymal mass.  There are degenerative changes of the lower thoracic discs. There is no compression fracture. The sternum and observed ribs exhibit no acute abnormalities.  CT ABDOMEN and PELVIS FINDINGS  The liver, gallbladder, pancreas, spleen, right adrenal gland, and kidneys are normal. There is stable mild enlargement of the left adrenal gland. There is no bulky periaortic or pericaval lymphadenopathy. The abdominal aorta exhibits mural thrombus and calcified plaque, but no evidence of aneurysm. The stomach  contains a PEG tube and is of partially distended with gas. The GE junction region remains prominent. No perigastric lymphadenopathy is demonstrated. The small bowel is normal. There is a moderate stool burden within the colon without evidence of obstruction. The rectum is mildly distended with gas and stool. The urinary bladder is unremarkable. The uterus is surgically absent. The patient has undergone previous posterior fusion at L3-4 and L4-5. The vertebral bodies are preserved in height. The bony pelvis is unremarkable.  Review of the MIP images confirms the above findings.  IMPRESSION: 1. There are small emboli within branches of the right lower lobe pulmonary artery posteriorly without evidence of significant right heart strain. There is small amount of adjacent presumed infarct versus pneumonia. 2. There is mild thickening of the wall of the distal third of the esophagus without evidence of a discrete mass or perforation. 3. There is no evidence of CHF nor pulmonary parenchymal masses. There is no pleural effusion. 4. No acute intra-abdominal abnormality is demonstrated. There are atherosclerotic changes of the abdominal aorta without evidence of dissection or aneurysm. There is stable mild enlargement of the left adrenal gland. 5. These results were called by telephone at the time of interpretation on 07/21/2014 at 12:59 pm to Dr. Isla Pence MD, who verbally acknowledged these results.   Electronically Signed By: David Martinique On: 07/21/2014 12:59   CLINICAL DATA: Initial treatment strategy for esophageal carcinoma.  EXAM: NUCLEAR MEDICINE PET SKULL BASE TO THIGH  TECHNIQUE: 11.0 mCi F-18 FDG was injected intravenously. Full-ring PET imaging was performed from the skull base to thigh after the radiotracer. CT data was obtained and used for attenuation correction and anatomic localization.  FASTING BLOOD GLUCOSE: Value: 73 mg/dl  COMPARISON: CT  03/31/2014  FINDINGS: NECK  No hypermetabolic lymph nodes in the neck.  CHEST  There is a long segment of intense hypermetabolic activity associated with the esophagus. This extends from relate just superior to the carina through the GE junction with SUV max equal 8.5. There is a hypermetabolic mass at the esophageal junction extending to the gastric cardia with SUV max equals 17.3. No hypermetabolic mediastinal lymph nodes. No suspicious pulmonary nodules.  ABDOMEN/PELVIS  Hypermetabolic mass in the gastric cardia described above. There is no hypermetabolic gastro hepatic ligament lymph nodes. No abnormal metabolic activity within the liver.  No hypermetabolic abdominal pelvic lymph nodes  SKELETON  No focal hypermetabolic activity to suggest skeletal metastasis.  IMPRESSION: 1. Hypermetabolic mass in the gastric cardia and esophageal junction consists with primary esophageal / gastric carcinoma. 2. Long segment intense metabolic activity associated with the mid and distal esophagus. Differential includes esophageal carcinoma versus esophagitis. 3. No evidence of hypermetabolic mediastinal or gastrohepatic ligament lymph nodes. 4. No evidence of liver metastasis. 5. No distant metastasis   Electronically Signed By: Helane Gunther.D.  On: 04/21/2014 13:57    CLINICAL DATA: Difficulty swallowing and epigastric pain. Esophageal disorder. Prior appendectomy and hysterectomy.  EXAM: CT CHEST AND ABDOMEN WITH CONTRAST  TECHNIQUE: Multidetector CT imaging of the chest and abdomen was performed following the standard protocol during bolus administration of intravenous contrast.  CONTRAST: 80 cc Isovue 370  COMPARISON: Abdominal pelvic CT 02/10/2014. Chest radiograph 04/07/2013. Chest CT 04/23/2008.  FINDINGS: CT CHEST FINDINGS  Lungs/Pleura: No nodules or airspace opacities. No pleural fluid.  Heart/Mediastinum: No supraclavicular adenopathy. Mildly  age advanced aortic and branch vessel atherosclerosis. Normal heart size with lipomatous hypertrophy of the interatrial septum. Multivessel coronary artery atherosclerosis. No mediastinal or hilar adenopathy. Air contrast level in the thoracic esophagus on image 28 of series 2 and more superiorly on image 19/series 2.  CT ABDOMEN FINDINGS  Abdomen: Normal liver, spleen. Redemonstration of soft tissue fullness involving the gastroesophageal junction and proximal stomach. Example images 37-42 of series 2. Suspect fluid/gastric contents with soft tissue density in the gastric cardia/body junction on image 39/series 2.  Normal pancreas, gallbladder, biliary tract. Mild right adrenal thickening and left adrenal nodularity are grossly similar back to 2009. Suspect an underlying left adrenal adenoma at 1.3 cm. 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 Normal right kidney. Advanced aortic and branch vessel atherosclerosis. Ulcerative plaque within the infrarenal aorta, including on image 62/series 2. Beam hardening artifact from spinal hardware. No retroperitoneal or retrocrural adenopathy. Normal colon and terminal ileum. Normal abdominal small bowel without ascites. No evidence of omental or peritoneal disease.  Bones/Musculoskeletal: L3-5 lumbar spine fixation. Lower thoracic degenerative disc disease.  IMPRESSION: CT CHEST IMPRESSION  1. No acute process in the chest. 2. Age advanced coronary artery atherosclerosis. Recommend assessment of coronary risk factors and consideration of medical therapy. 3. Mildly dilated thoracic esophagus with contrast within. This suggests a component of esophageal obstruction, dysmotility, or gastroesophageal reflux disease.  CT ABDOMEN AND PELVIS IMPRESSION  1. Redemonstration of soft tissue fullness at the distal esophagus and proximal stomach. Cannot exclude gastritis or gastroesophageal carcinoma. If not already performed,  endoscopy is recommended. 2. Advanced abdominal aortic and branch vessel atherosclerosis.   Electronically Signed By: Abigail Miyamoto M.D. On: 03/31/2014 14:14   Pretreatment EUS: Endoscopic findings: 1. Malignant mass in distal esophagus. The mass was circumferential, patially obstructing but I was able to fairly easily advance echoendoscope through the strictured lumen. The proximal edge was 34 cm from the incisors and distal edge (just below the GE junction) was at 39 cm. The mass is 5cm long. EUS findings: 1. The mass above corresponded with a hypoechoic, heterogeneous mass that clearly passes into and through the muscularis propria layer of the esophageal wall (uT3). 2. There were two round, well demarcated, 5-64mm, hypoechoic paraesophageal lymphnodes that lay directly adjacent to the mass that are suspicious for malignant involvement (uN1). 3. No celiac adenopathy. Impression: uT3N1 (clinical stage IIIa) 5cm long, cirumferential GE junction adenocarcinoma with proximal edge at 34cm from incisors and distal edge just below the GE junction.  Recent Lab Findings: Lab Results  Component Value Date   WBC 3.9* 12/07/2014      Assessment / Plan:   Patient appears to be a frail 69 year old female with advanced stage esophageal cancer at least IIIa, recent CT scan shows evidence of pulmonary embolus but no evidence of widespread metastatic disease. Both her underlying health and difficulty tolerating previous therapy these her at this point being a very poor surgical candidate from a medical standpoint. The patient does  look improved from when I saw her several weeks ago, but is still frail and just moving around the office. I think at this point whatever benefit could be gained and decrease recurrence from total esophagectomy would be totally negated by her multiple medical comorbidities and frail condition . I discussed this with her and her daughter. The patient states that she does not  really want an operation anyway.  I am willing to see her back in 2-3 months  and we will see how she was doing.       Grace Isaac MD      [address] [place] [address] Office [place]  12/23/2014 10:15 AM

## 2014-12-23 NOTE — Progress Notes (Signed)
This encounter was created in error - please disregard.  This encounter was created in error - please disregard.

## 2014-12-24 ENCOUNTER — Telehealth: Payer: Self-pay | Admitting: *Deleted

## 2015-03-15 ENCOUNTER — Encounter: Payer: Self-pay | Admitting: Cardiothoracic Surgery

## 2015-03-15 ENCOUNTER — Ambulatory Visit (INDEPENDENT_AMBULATORY_CARE_PROVIDER_SITE_OTHER): Payer: Commercial Managed Care - PPO | Admitting: Cardiothoracic Surgery

## 2015-03-15 ENCOUNTER — Other Ambulatory Visit: Payer: Self-pay | Admitting: *Deleted

## 2015-03-15 VITALS — BP 110/68 | HR 88 | Resp 16 | Ht 61.0 in | Wt 151.0 lb

## 2015-03-15 DIAGNOSIS — C159 Malignant neoplasm of esophagus, unspecified: Secondary | ICD-10-CM

## 2015-03-15 DIAGNOSIS — Z9221 Personal history of antineoplastic chemotherapy: Secondary | ICD-10-CM

## 2015-03-15 DIAGNOSIS — Z923 Personal history of irradiation: Secondary | ICD-10-CM

## 2015-03-15 DIAGNOSIS — Z5189 Encounter for other specified aftercare: Secondary | ICD-10-CM

## 2015-03-15 NOTE — Progress Notes (Signed)
[address]       [place] [address]             [place] Record #[id number] Date of Birth: Jun 24, 1946  Referring: Marice Potter, MD Primary Care: Lillard Anes, MD  Chief Complaint:    Chief Complaint  Patient presents with  . Follow-up    to discuss surgery further    History of Present Illness:    Sharon Price 69 y.o. 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 The patient has completed neoadjuvant chemotherapy radiation with weekly carboplatin and and paclltaxel in AUG 2015. Her treatment course has been complicated with severe dehydration poor nutrition, requiring a PEG tube to be placed which  Became  infected and had to be replaced several times. The patient needed supplementary IV fluids through a PICC line.   Overall she is markedly improved since the last time I saw her. Her weight has increased from 130-to 151, she's taking a by mouth diet without difficulty. She stopped smoking 3 weeks ago    Patient is noted on  CT scan 07/21/2014 to have evidence of pulmonary emboli, she is now on  xarelto  Repeat endoscopy in [place] is reported to confirm recurrent or persistent carcinoma in the distal esophagus on biopsy.   Current Activity/ Functional Status:  Patient is independent with mobility/ambulation, transfers, ADL's, IADL's.   Zubrod Score: At the time of surgery this patient's most appropriate activity status/level should be described as: []     0    Normal activity, no symptoms [x]     1    Restricted in physical strenuous activity but ambulatory, able to do out light work []     2    Ambulatory and capable of self care, unable to do work activities, up and about               >50 % of waking hours                              []     3    Only limited self care, in bed greater than  50% of waking hours []     4    Completely disabled, no self care, confined to bed or chair []     5    Moribund   Past Medical History  Diagnosis Date  . Diabetes mellitus without complication   . Chronic kidney disease     renal..STAGE 2  . Hypertension   . Mechanical dysphagia     MOSTLY SOLIDS  . Pulmonary emboli 9/223/15 [place]    RIGHT LOWER LOBE PER CT CHEST   . Patient on combined chemotherapy and radiation     [facility]  . Cancer 03/30/14    GE JUNCTION    Past Surgical History  Procedure Laterality Date  . Back surgery    . Abdominal hysterectomy    . Tubal ligation    . Eus N/A 04/08/2014    Procedure: ESOPHAGEAL ENDOSCOPIC ULTRASOUND (EUS) RADIAL;  Surgeon: Milus Banister, MD;  Location: [facility];  Service: Endoscopy;  Laterality: N/A;  . Appendectomy    .  Spine surgery    . Lumbar fusion    . Esophagogastroduodenoscopy (egd) with propofol N/A 12/02/2014    Procedure: ESOPHAGOGASTRODUODENOSCOPY (EGD) WITH PROPOFOL;  Surgeon: Jerene Bears, MD;  Location: [facility] END[facility];  Service: Endoscopy;  Laterality: N/A;    Family History  Problem Relation Age of Onset  . Diabetes Mother   . Hypertension Mother   . Stroke Mother   . Heart disease Mother   . Hypertension Father   . Diabetes Sister   . Cancer Sister     THYROID  . Cancer Brother     LUNG  . Diabetes Daughter   . Diabetes Son     History   Social History  . Marital Status: Widowed    Spouse Name: N/A  . Number of Children: N/A  . Years of Education: N/A   Occupational History  . Not on file.   Social History Main Topics  . Smoking status: Light Tobacco Smoker  . Smokeless tobacco: Not on file  . Alcohol Use: No  . Drug Use: No  . Sexual Activity: Not on file   Other Topics Concern  . Not on file   Social History Narrative    History  Smoking status  . Light Tobacco Smoker  Smokeless tobacco  . Not on file    History  Alcohol  Use No     No Known Allergies  Current Outpatient Prescriptions  Medication Sig Dispense Refill  . gabapentin (NEURONTIN) 300 MG capsule Take 300 mg by mouth 4 (four) times daily.    Marland Kitchen HYDROcodone-acetaminophen (NORCO/VICODIN) 5-325 MG per tablet Take 1 tablet by mouth every 4 (four) hours as needed for moderate pain. 65 tablet 0  . insulin glargine (LANTUS) 100 UNIT/ML injection Inject 0.1 mLs (10 Units total) into the skin at bedtime. 10 mL 5  . lisinopril (PRINIVIL,ZESTRIL) 20 MG tablet Take 1 tablet (20 mg total) by mouth daily. 30 tablet 1  . LORazepam (ATIVAN) 0.5 MG tablet Take 1 tablet (0.5 mg total) by mouth every 8 (eight) hours as needed for anxiety. 30 tablet 0  . pantoprazole (PROTONIX) 40 MG tablet Take 1 tablet (40 mg total) by mouth 2 (two) times daily before a meal. 60 tablet 1  . pravastatin (PRAVACHOL) 20 MG tablet Take 20 mg by mouth every morning.    . rivaroxaban (XARELTO) 20 MG TABS tablet Take 20 mg by mouth daily with supper.    . sennosides (SENOKOT) 8.8 MG/5ML syrup Take 10 mLs by mouth 2 (two) times daily. 240 mL 5  . sucralfate (CARAFATE) 1 GM/10ML suspension Take 10 mLs (1 g total) by mouth 4 (four) times daily -  before meals and at bedtime. 420 mL 5   No current facility-administered medications for this visit.     Review of Systems:     Cardiac Review of Systems: Y or N  Chest Pain [  y  ]  Resting SOB [ n  ] Exertional SOB  [ y ]  Orthopnea [  ]   Pedal Edema [   ]    Palpitations [  ] Syncope  [  ]   Presyncope [   ]  General Review of Systems: [Y] = yes [  ]=no Constitional: recent weight change [ gained wt ];  Wt loss over the last 3 months [ ]  anorexia [  ]; fatigue [ y ]; nausea [ y ]; night sweats [  ]; fever [  ]; or chills [  ];  Dental: poor dentition[ n ]; Last Dentist visit:   Eye : blurred vision [  ]; diplopia [   ]; vision changes [  ];  Amaurosis fugax[  ]; Resp: cough [  ];  wheezing[  ];  hemoptysis[ n ]; shortness of breath[ n  ]; paroxysmal nocturnal dyspnea[  ]; dyspnea on exertion[ y ]; or orthopnea[  ];  GI:  gallstones[  ], vomiting[n  ];  dysphagia[y  ]; melena[  ];  hematochezia [ y ]; heartburn[ y ];   Hx of  Colonoscopy[  ]; GU: kidney stones [  ]; hematuria[  ];   dysuria [  ];  nocturia[  ];  history of     obstruction [  ]; urinary frequency [  ]             Skin: rash, swelling[  ];, hair loss[  ];  peripheral edema[  ];  or itching[  ]; Musculosketetal: myalgias[  ];  joint swelling[  ];  joint erythema[  ];  joint pain[  ];  back pain[  ];  Heme/Lymph: bruising[  ];  bleeding[n  ];  anemia[  ];  Neuro: TIA[  ];  headaches[  ];  stroke[  ];  vertigo[  ];  seizures[  ];   paresthesias[  ];  difficulty walking[ n ];  Psych:depression[  ]; anxiety[ y ];  Endocrine: diabetes[  ];  thyroid dysfunction[  ];  Immunizations: Flu up to date [ n ]; Pneumococcal up to date Fl[facility]ncio.[facility]  ];  Other:  Physical Exam: BP 110/68 mmHg  Resp 16  Ht 5\' 1"  (1.549 m)  Wt 151 lb (68.493 kg)  BMI 28.55 kg/m2   Wt Readings from Last 3 Encounters:  03/15/15 151 lb (68.493 kg)  11/30/14 157 lb 3 oz (71.3 kg)  09/16/14 151 lb (68.493 kg)   weight up from 132  PHYSICAL EXAMINATION:  General appearance: alert, cooperative, appears older than stated age, cachectic and fatigued Neurologic: intact Heart: irregularly irregular rhythm Lungs: diminished breath sounds bibasilar Abdomen: Abdomen mildly distended with intact G-tube in the left upper abdomen Extremities: extremities normal, atraumatic, no cyanosis or edema and Homans sign is negative, no sign of DVT Wound: There appears to be no infection around the I do not appreciate cervical or supraclavicular adenopathy PEG tubehas been removed   Diagnostic Studies & Laboratory data:     Recent Radiology Findings:  Final Report  03/07/2015  CLINICAL DATA: Esophageal cancer. Chemotherapy and radiation therapy completed August 2015.  EXAM: CT ABDOMEN AND PELVIS WITH  CONTRAST  TECHNIQUE: Multidetector CT imaging of the abdomen and pelvis was performed using the standard protocol following bolus administration of intravenous contrast.  CONTRAST: 100 mL Isovue  COMPARISON: PET-CT scan 04/21/2014, CT scan 11/16/2014  FINDINGS: Lower chest: Lung bases are clear.  Hepatobiliary: No focal hepatic lesion. The gallbladder is collapsed.  Pancreas: Pancreas is normal. No ductal dilatation. No pancreatic inflammation.  Spleen: Normal spleen  Adrenals/urinary tract: Mild nodularity of adrenal glands is unchanged. Kidneys are normal. The ureters and bladder normal.  Stomach/Bowel: There is thickening of the distal esophagus similar to prior. Stomach, small bowel, and colon are unremarkable.  Vascular/Lymphatic: Atherosclerotic calcification aorta. No periportal retroperitoneal lymphadenopathy. No gastrohepatic ligament lymphadenopathy. No pelvic adenopathy or inguinal adenopathy.  Reproductive: Post hysterectomy.  Musculoskeletal: No aggressive osseous lesion. Posterior lumbar fusion  Other: No peritoneal metastasis.  IMPRESSION: 1. No evidence of esophageal cancer recurrence or metastasis in the abdomen pelvis. 2.  Stable thickening distal esophagus likely relates radiation change. 3. Atherosclerotic calcification of the aorta.   Electronically Signed By: Suzy Bouchard M.D. On: 03/07/2015 16:43    CLINICAL DATA: Shortness of breath; history of esophageal malignancy, PEG tube placement, appendectomy, hysterectomy, and lumbar fusion.  EXAM: CT ANGIOGRAPHY CHEST  CT ABDOMEN AND PELVIS WITH CONTRAST  TECHNIQUE: Multidetector CT imaging of the chest was performed using the standard protocol during bolus administration of intravenous contrast. Multiplanar CT image reconstructions and MIPs were obtained to evaluate the vascular anatomy. Multidetector CT imaging of the abdomen and pelvis was performed using the standard  protocol during bolus administration of intravenous contrast.  CONTRAST: 100 cc of Isovue 370 intravenously. The patient also received oral contrast material through the PEG tube.  COMPARISON: Portable chest x-ray of today's date and PET-CT study of April 21, 2014 and CT scan of the chest and abdomen abdomen dated March 31, 2014  FINDINGS: CTA CHEST FINDINGS  There are small filling defects within peripheral pulmonary arterial branches to the right lower lobe. There are no filling defects elsewhere on the right nor on the left. The caliber of the thoracic aorta is normal. The cardiac chambers are normal in size. The RV LV ratio is approximately 1. There is no pericardial effusion. There is no lymphadenopathy. There is thickening of the wall of the lower 1/3 of the esophagus without visible discrete mass. There is no free mediastinal fluid or air.  At lung window settings there is no pulmonary parenchymal mass. There is increased density in the posterior medial aspect of the right costophrenic gutter which may reflect pneumonia or pulmonary infarction new since the previous studies. There is no pleural effusion nor pulmonary parenchymal mass.  There are degenerative changes of the lower thoracic discs. There is no compression fracture. The sternum and observed ribs exhibit no acute abnormalities.  CT ABDOMEN and PELVIS FINDINGS  The liver, gallbladder, pancreas, spleen, right adrenal gland, and kidneys are normal. There is stable mild enlargement of the left adrenal gland. There is no bulky periaortic or pericaval lymphadenopathy. The abdominal aorta exhibits mural thrombus and calcified plaque, but no evidence of aneurysm. The stomach contains a PEG tube and is of partially distended with gas. The GE junction region remains prominent. No perigastric lymphadenopathy is demonstrated. The small bowel is normal. There is a moderate stool burden within the colon without evidence of  obstruction. The rectum is mildly distended with gas and stool. The urinary bladder is unremarkable. The uterus is surgically absent. The patient has undergone previous posterior fusion at L3-4 and L4-5. The vertebral bodies are preserved in height. The bony pelvis is unremarkable.  Review of the MIP images confirms the above findings.  IMPRESSION: 1. There are small emboli within branches of the right lower lobe pulmonary artery posteriorly without evidence of significant right heart strain. There is small amount of adjacent presumed infarct versus pneumonia. 2. There is mild thickening of the wall of the distal third of the esophagus without evidence of a discrete mass or perforation. 3. There is no evidence of CHF nor pulmonary parenchymal masses. There is no pleural effusion. 4. No acute intra-abdominal abnormality is demonstrated. There are atherosclerotic changes of the abdominal aorta without evidence of dissection or aneurysm. There is stable mild enlargement of the left adrenal gland. 5. These results were called by telephone at the time of interpretation on 07/21/2014 at 12:59 pm to Dr. Isla Pence MD, who verbally acknowledged these results.   Electronically Signed By:  David Martinique On: 07/21/2014 12:59   CLINICAL DATA: Initial treatment strategy for esophageal carcinoma.  EXAM: NUCLEAR MEDICINE PET SKULL BASE TO THIGH  TECHNIQUE: 11.0 mCi F-18 FDG was injected intravenously. Full-ring PET imaging was performed from the skull base to thigh after the radiotracer. CT data was obtained and used for attenuation correction and anatomic localization.  FASTING BLOOD GLUCOSE: Value: 73 mg/dl  COMPARISON: CT 03/31/2014  FINDINGS: NECK  No hypermetabolic lymph nodes in the neck.  CHEST  There is a long segment of intense hypermetabolic activity associated with the esophagus. This extends from relate just superior to the carina through the GE junction with SUV  max equal 8.5. There is a hypermetabolic mass at the esophageal junction extending to the gastric cardia with SUV max equals 17.3. No hypermetabolic mediastinal lymph nodes. No suspicious pulmonary nodules.  ABDOMEN/PELVIS  Hypermetabolic mass in the gastric cardia described above. There is no hypermetabolic gastro hepatic ligament lymph nodes. No abnormal metabolic activity within the liver.  No hypermetabolic abdominal pelvic lymph nodes  SKELETON  No focal hypermetabolic activity to suggest skeletal metastasis.  IMPRESSION: 1. Hypermetabolic mass in the gastric cardia and esophageal junction consists with primary esophageal / gastric carcinoma. 2. Long segment intense metabolic activity associated with the mid and distal esophagus. Differential includes esophageal carcinoma versus esophagitis. 3. No evidence of hypermetabolic mediastinal or gastrohepatic ligament lymph nodes. 4. No evidence of liver metastasis. 5. No distant metastasis   Electronically Signed By: Suzy Bouchard M.D. On: 04/21/2014 13:57    CLINICAL DATA: Difficulty swallowing and epigastric pain. Esophageal disorder. Prior appendectomy and hysterectomy.  EXAM: CT CHEST AND ABDOMEN WITH CONTRAST  TECHNIQUE: Multidetector CT imaging of the chest and abdomen was performed following the standard protocol during bolus administration of intravenous contrast.  CONTRAST: 80 cc Isovue 370  COMPARISON: Abdominal pelvic CT 02/10/2014. Chest radiograph 04/07/2013. Chest CT 04/23/2008.  FINDINGS: CT CHEST FINDINGS  Lungs/Pleura: No nodules or airspace opacities. No pleural fluid.  Heart/Mediastinum: No supraclavicular adenopathy. Mildly age advanced aortic and branch vessel atherosclerosis. Normal heart size with lipomatous hypertrophy of the interatrial septum. Multivessel coronary artery atherosclerosis. No mediastinal or hilar adenopathy. Air contrast level in the thoracic esophagus on image 28  of series 2 and more superiorly on image 19/series 2.  CT ABDOMEN FINDINGS  Abdomen: Normal liver, spleen. Redemonstration of soft tissue fullness involving the gastroesophageal junction and proximal stomach. Example images 37-42 of series 2. Suspect fluid/gastric contents with soft tissue density in the gastric cardia/body junction on image 39/series 2.  Normal pancreas, gallbladder, biliary tract. Mild right adrenal thickening and left adrenal nodularity are grossly similar back to 2009. Suspect an underlying left adrenal adenoma at 1.3 cm. 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 Normal right kidney. Advanced aortic and branch vessel atherosclerosis. Ulcerative plaque within the infrarenal aorta, including on image 62/series 2. Beam hardening artifact from spinal hardware. No retroperitoneal or retrocrural adenopathy. Normal colon and terminal ileum. Normal abdominal small bowel without ascites. No evidence of omental or peritoneal disease.  Bones/Musculoskeletal: L3-5 lumbar spine fixation. Lower thoracic degenerative disc disease.  IMPRESSION: CT CHEST IMPRESSION  1. No acute process in the chest. 2. Age advanced coronary artery atherosclerosis. Recommend assessment of coronary risk factors and consideration of medical therapy. 3. Mildly dilated thoracic esophagus with contrast within. This suggests a component of esophageal obstruction, dysmotility, or gastroesophageal reflux disease.  CT ABDOMEN AND PELVIS IMPRESSION  1. Redemonstration of soft tissue fullness at the distal esophagus and proximal  stomach. Cannot exclude gastritis or gastroesophageal carcinoma. If not already performed, endoscopy is recommended. 2. Advanced abdominal aortic and branch vessel atherosclerosis.   Electronically Signed By: Abigail Miyamoto M.D. On: 03/31/2014 14:14   Pretreatment EUS: Endoscopic findings: 1. Malignant mass in distal esophagus. The mass was  circumferential, patially obstructing but I was able to fairly easily advance echoendoscope through the strictured lumen. The proximal edge was 34 cm from the incisors and distal edge (just below the GE junction) was at 39 cm. The mass is 5cm long. EUS findings: 1. The mass above corresponded with a hypoechoic, heterogeneous mass that clearly passes into and through the muscularis propria layer of the esophageal wall (uT3). 2. There were two round, well demarcated, 5-42mm, hypoechoic paraesophageal lymphnodes that lay directly adjacent to the mass that are suspicious for malignant involvement (uN1). 3. No celiac adenopathy. Impression: uT3N1 (clinical stage IIIa) 5cm long, cirumferential GE junction adenocarcinoma with proximal edge at 34cm from incisors and distal edge just below the GE junction.  Recent Lab Findings: Lab Results  Component Value Date   WBC 3.9* 12/07/2014      Assessment / Plan:   Patient appearstoo dramatically improved since the last time I saw her . she has completed radiation and chemotherapy for esophageal cancer at least IIIa, recent CT scan shows  pulmonary  but no evidence of widespread metastatic disease. At this point with recurrent local disease in her functional status much improved we can consider surgical resection. I discussed this with the patient and her daughter. To fully evaluate her current operative status we will Obtain the path report and operative report from recent endoscopy Asked Dr. Ardis Hughs to repeat esophageal ultrasound/EUS Obtain a PET scan Because of the patient's long-term smoking history and history of diabetes obtain cardiac clearance Obtain recent pulmonary function studies We'll discuss with Dr. Bobby Rumpf the history of pulmonary emboli and whether the patient should have a caval filter placed prior to surgery Plan to see her back in approximate 2 weeks after the above studies have been completed  I  spent 35 minutes counseling the  patient face to face and 50% or more the  time was spent in counseling and coordination of care. The total time spent in the appointment was 45 minutes.  Grace Isaac MD      [address] [place] [address] Office [phone]   Beeper [phone]  03/15/2015 3:54 PM

## 2015-03-16 ENCOUNTER — Telehealth: Payer: Self-pay

## 2015-03-16 DIAGNOSIS — C159 Malignant neoplasm of esophagus, unspecified: Secondary | ICD-10-CM

## 2015-03-16 NOTE — Telephone Encounter (Signed)
Happy to help         Karah Caruthers,    She needs repeat upper EUS, radial +/- linear, next available EUS Thursday with MAC for esophageal cancer        Thanks            ----- Message -----     From: Grace Isaac, MD     Sent: 03/15/2015  4:51 PM      To: Milus Banister, MD        Please see this recent information about our joint patient.    Lilia Argue Gerhardt MD    [facility]    [address]    [place] [address]       [phone] office       [phone] beeper

## 2015-03-16 NOTE — Telephone Encounter (Signed)
Dr Ardis Hughs the pt is on Xarelto will she need to hold?

## 2015-03-16 NOTE — Telephone Encounter (Signed)
Does not need to hold xarelto

## 2015-03-17 ENCOUNTER — Other Ambulatory Visit: Payer: Self-pay

## 2015-03-17 DIAGNOSIS — C159 Malignant neoplasm of esophagus, unspecified: Secondary | ICD-10-CM

## 2015-03-17 NOTE — Telephone Encounter (Signed)
EUS scheduled, pt instructed and medications reviewed.  Patient instructions mailed to home.  Patient to call with any questions or concerns.  

## 2015-03-21 ENCOUNTER — Encounter (HOSPITAL_COMMUNITY): Payer: Self-pay | Admitting: *Deleted

## 2015-03-24 ENCOUNTER — Ambulatory Visit (HOSPITAL_COMMUNITY)
Admission: RE | Admit: 2015-03-24 | Discharge: 2015-03-24 | Disposition: A | Discharge status: Home / Self Care | Payer: Commercial Managed Care - PPO | Source: Ambulatory Visit | Attending: Cardiothoracic Surgery | Admitting: Cardiothoracic Surgery

## 2015-03-24 DIAGNOSIS — C159 Malignant neoplasm of esophagus, unspecified: Secondary | ICD-10-CM | POA: Insufficient documentation

## 2015-03-24 LAB — PULMONARY FUNCTION TEST
DL/VA % pred: 77 %
DL/VA: 3.4 ml/min/mmHg/L
DLCO unc % pred: 80 %
DLCO unc: 16.24 ml/min/mmHg
FEF 25-75 Post: 2.3 L/sec
FEF 25-75 Pre: 2.58 L/sec
FEF2575-%Change-Post: -10 %
FEF2575-%Pred-Post: 128 %
FEF2575-%Pred-Pre: 143 %
FEV1-%Change-Post: -2 %
FEV1-%Pred-Post: 95 %
FEV1-%Pred-Pre: 98 %
FEV1-Post: 1.94 L
FEV1-Pre: 1.99 L
FEV1FVC-%Change-Post: 1 %
FEV1FVC-%Pred-Pre: 112 %
FEV6-%Change-Post: -4 %
FEV6-%Pred-Post: 86 %
FEV6-%Pred-Pre: 90 %
FEV6-Post: 2.23 L
FEV6-Pre: 2.33 L
FEV6FVC-%Change-Post: 0 %
FEV6FVC-%Pred-Post: 104 %
FEV6FVC-%Pred-Pre: 104 %
FVC-%Change-Post: -4 %
FVC-%Pred-Post: 82 %
FVC-%Pred-Pre: 86 %
FVC-Post: 2.23 L
FVC-Pre: 2.34 L
Post FEV1/FVC ratio: 87 %
Post FEV6/FVC ratio: 100 %
Pre FEV1/FVC ratio: 85 %
Pre FEV6/FVC Ratio: 100 %
RV % pred: 101 %
RV: 2.03 L
TLC % pred: 104 %
TLC: 4.79 L

## 2015-03-24 LAB — GLUCOSE, CAPILLARY: Glucose-Capillary: 219 mg/dL — ABNORMAL HIGH (ref 65–99)

## 2015-03-24 MED ORDER — FLUDEOXYGLUCOSE F - 18 (FDG) INJECTION
7.4000 | Freq: Once | INTRAVENOUS | Status: AC | PRN
  Administered 2015-03-24: 7.4 via INTRAVENOUS

## 2015-03-24 MED ORDER — ALBUTEROL SULFATE (2.5 MG/3ML) 0.083% IN NEBU
2.5000 mg | INHALATION_SOLUTION | Freq: Once | RESPIRATORY_TRACT | Status: AC
  Administered 2015-03-24: 2.5 mg via RESPIRATORY_TRACT

## 2015-03-27 NOTE — Progress Notes (Signed)
Cardiology Office Note   Date:  03/27/2015   ID:  Mackenze, [place] Jan 19, 1946, MRN [id number]  PCP:  Lillard Anes, MD  Cardiologist:   Jenkins Rouge, MD   No chief complaint on file.     History of Present Illness: Sharon Price is a 69 y.o. female who presents for preop clearance She has been battling esophageal cancer over the past year and needs throracic surgery with Dr Servando Snare.  She is a smoker with  Elevated cholesterol DM and HTN.  Had a PE last year and has been on xarelto  No documented CAD.  PET scan 03/24/15 reviewed and no evidence of metastatic disease  She has noted some rapid heart beats last few weeks.  No chest pain  Some dysonea this week and ? URI  Primary Dr Henrene Pastor gave her an inhaler no CXR done.  Eating ok Some esophageal ulcers but no dysphagia.  Having [place] by GI doctors this week but does not have to stop xarelto for this.  In office today atrial flutter rate 110.  Some slowing with vagal maneuvers.  Long discussion with her about what needs to be done before any high risk thoracic operation with Dr Servando Snare.      Past Medical History  Diagnosis Date  . Diabetes mellitus without complication   . Chronic kidney disease     renal..STAGE 2  . Hypertension   . Mechanical dysphagia     MOSTLY SOLIDS  . Pulmonary emboli 9/223/15 [place]    RIGHT LOWER LOBE PER CT CHEST   . Patient on combined chemotherapy and radiation     [facility]  . Cancer 03/30/14    GE JUNCTION  . Transfusion history     ?[facility].    Past Surgical History  Procedure Laterality Date  . Back surgery    . Abdominal hysterectomy    . Tubal ligation    . Eus N/A 04/08/2014    Procedure: ESOPHAGEAL ENDOSCOPIC ULTRASOUND (EUS) RADIAL;  Surgeon: Milus Banister, MD;  Location: [facility] ENDOSCOPY;  Service: Endoscopy;  Laterality: N/A;  . Appendectomy    . Spine surgery    . Lumbar fusion    .  Esophagogastroduodenoscopy (egd) with propofol N/A 12/02/2014    Procedure: ESOPHAGOGASTRODUODENOSCOPY (EGD) WITH PROPOFOL;  Surgeon: Jerene Bears, MD;  Location: [facility] ENDOSCOPY;  Service: Endoscopy;  Laterality: N/A;     Current Outpatient Prescriptions  Medication Sig Dispense Refill  . acetaminophen (TYLENOL) 500 MG tablet Take 1,000 mg by mouth every 6 (six) hours as needed for moderate pain or headache.    . ergocalciferol (VITAMIN D2) 50000 UNITS capsule Take 50,000 Units by mouth every 30 (thirty) days. Takes around the 1st or 2nd of month.    . gabapentin (NEURONTIN) 300 MG capsule Take 300 mg by mouth 4 (four) times daily.    Marland Kitchen HYDROcodone-acetaminophen (NORCO/VICODIN) 5-325 MG per tablet Take 1 tablet by mouth every 4 (four) hours as needed for moderate pain. 65 tablet 0  . insulin glargine (LANTUS) 100 UNIT/ML injection Inject 0.1 mLs (10 Units total) into the skin at bedtime. 10 mL 5  . lisinopril (PRINIVIL,ZESTRIL) 20 MG tablet Take 1 tablet (20 mg total) by mouth daily. 30 tablet 1  . LORazepam (ATIVAN) 0.5 MG tablet Take 1 tablet (0.5 mg total) by mouth every 8 (eight) hours as needed for anxiety. 30 tablet 0  . Nepafenac (ILEVRO OP) Place 1 drop into  the right eye at bedtime.    . pantoprazole (PROTONIX) 40 MG tablet Take 1 tablet (40 mg total) by mouth 2 (two) times daily before a meal. 60 tablet 1  . pravastatin (PRAVACHOL) 20 MG tablet Take 20 mg by mouth every morning.    . rivaroxaban (XARELTO) 20 MG TABS tablet Take 20 mg by mouth daily with supper.    . sennosides (SENOKOT) 8.8 MG/5ML syrup Take 10 mLs by mouth 2 (two) times daily. 240 mL 5  . sucralfate (CARAFATE) 1 GM/10ML suspension Take 10 mLs (1 g total) by mouth 4 (four) times daily -  before meals and at bedtime. 420 mL 5   No current facility-administered medications for this visit.    Allergies:   Review of patient's allergies indicates no known allergies.    Social History:  The patient  reports that she quit  smoking about 3 weeks ago. She does not have any smokeless tobacco history on file. She reports that she does not drink alcohol or use illicit drugs.   Family History:  The patient's family history includes Cancer in her brother and sister; Diabetes in her daughter, mother, sister, and son; Heart disease in her mother; Hypertension in her father and mother; Stroke in her mother.    ROS:  Please see the history of present illness.   Otherwise, review of systems are positive for none.   All other systems are reviewed and negative.    PHYSICAL EXAM: VS:  There were no vitals taken for this visit. , BMI There is no weight on file to calculate BMI. Affect appropriate Healthy:  appears stated age 5: normal Neck supple with no adenopathy JVP normal no bruits no thyromegaly Lungs clear with no wheezing and good diaphragmatic motion Heart:  S1/S2 no murmur, no rub, gallop or click PMI normal Abdomen: benighn, BS positve, no tenderness, no AAA no bruit.  No HSM or HJR Distal pulses intact with no bruits No edema Neuro non-focal Skin warm and dry No muscular weakness    EKG:   Atrial flutter rate 110  LAFB LVH poor R wave progression no old one to compare   Recent Labs: 12/07/2014: BUN 6; Creatinine 0.76; Hemoglobin 12.0; Platelets 133*; Potassium 3.2*; Sodium 143    Lipid Panel No results found for: CHOL, TRIG, HDL, CHOLHDL, VLDL, LDLCALC, LDLDIRECT    Wt Readings from Last 3 Encounters:  03/15/15 151 lb (68.493 kg)  11/30/14 157 lb 3 oz (71.3 kg)  09/16/14 151 lb (68.493 kg)      Other studies Reviewed: Additional studies/ records that were reviewed today include: DrGerhardt's office notes and Epic notes.    ASSESSMENT AND PLAN:  1.  Atrial flutter:  Start lopressor 25 bid.  Echo to assess structural heart disease  Continue xarelto Has been uninterupted for at least 5 weeks Discussed [facility] next week Risks including stroke and pacer need discussed willing to proceed.  [facility]  called scheduled and orders written Needs labs today 2. Pulmonay:  Interstitial lung sounds Inhaler given check CXR today may have radiation induced lung disease V/Q abnormal to begin with with  History of PE  Assess PA pressure with echo  3. Esophageal cancer.  Continue 3 drug Rx for ulcers.  Nutrition better Cardiopulmonary issues currently would prevent surgery with Dr Servando Snare Continue with [place] of esophagus this week Do not stop xarelto 4. HTN:  Well controlled.  Continue current medications and low sodium Dash type diet.   5. DM:  Discussed  low carb diet.  Target hemoglobin A1c is 6.5 or less.  Continue current medications.   Jenkins Rouge    Current medicines are reviewed at length with the patient today.  The patient does not have concerns regarding medicines.  The following changes have been made:  Lopressor 25 bid  Labs/ tests ordered today include:  Labs, Echo and CXR    No orders of the defined types were placed in this encounter.     Disposition:  [facility] scheduled for 6/10 10:00       Signed, Jenkins Rouge, MD  03/27/2015 8:07 PM    [facility] [address], [place], [place]  [address] Phone: [phone]; Fax: [phone]

## 2015-03-30 ENCOUNTER — Encounter: Payer: Self-pay | Admitting: Cardiovascular Disease

## 2015-03-30 ENCOUNTER — Other Ambulatory Visit: Payer: Self-pay | Admitting: *Deleted

## 2015-03-30 ENCOUNTER — Encounter: Payer: Self-pay | Admitting: *Deleted

## 2015-03-30 ENCOUNTER — Ambulatory Visit (INDEPENDENT_AMBULATORY_CARE_PROVIDER_SITE_OTHER)
Admission: RE | Admit: 2015-03-30 | Discharge: 2015-03-30 | Disposition: A | Discharge status: Home / Self Care | Payer: Commercial Managed Care - PPO | Source: Ambulatory Visit | Attending: Cardiovascular Disease | Admitting: Cardiovascular Disease

## 2015-03-30 ENCOUNTER — Other Ambulatory Visit: Payer: Self-pay | Admitting: Cardiovascular Disease

## 2015-03-30 ENCOUNTER — Ambulatory Visit (INDEPENDENT_AMBULATORY_CARE_PROVIDER_SITE_OTHER): Payer: Commercial Managed Care - PPO | Admitting: Cardiovascular Disease

## 2015-03-30 VITALS — BP 118/64 | HR 110 | Ht 61.0 in | Wt 151.0 lb

## 2015-03-30 DIAGNOSIS — I484 Atypical atrial flutter: Secondary | ICD-10-CM | POA: Diagnosis not present

## 2015-03-30 DIAGNOSIS — I4891 Unspecified atrial fibrillation: Secondary | ICD-10-CM | POA: Diagnosis not present

## 2015-03-30 MED ORDER — METFORMIN HCL 500 MG PO TABS: 500.0000 mg | ORAL_TABLET | Freq: Two times a day (BID) | ORAL | Status: AC

## 2015-03-30 MED ORDER — METOPROLOL TARTRATE 25 MG PO TABS: 25.0000 mg | ORAL_TABLET | Freq: Two times a day (BID) | ORAL | Status: DC

## 2015-03-30 NOTE — Patient Instructions (Signed)
Medication Instructions:  START METOPROLOL  25 MG  1 TAB  TWICE  DAILY   Labwork: TODAY  BMET  CBC  Testing/Procedures: Your physician has requested that you have an echocardiogram. Echocardiography is a painless test that uses sound waves to create images of your heart. It provides your doctor with information about the size and shape of your heart and how well your heart's chambers and valves are working. This procedure takes approximately one hour. There are no restrictions for this procedure. THIS WEEK  Your physician has recommended that you have a Cardioversion (DCCV). Electrical Cardioversion uses a jolt of electricity to your heart either through paddles or wired patches attached to your chest. This is a controlled, usually prescheduled, procedure. Defibrillation is done under light anesthesia in the hospital, and you usually go home the day of the procedure. This is done to get your heart back into a normal rhythm. You are not awake for the procedure. Please see the instruction sheet given to you today.  A chest x-ray takes a picture of the organs and structures inside the chest, including the heart, lungs, and blood vessels. This test can show several things, including, whether the heart is enlarges; whether fluid is building up in the lungs; and whether pacemaker / defibrillator leads are still in place. TODAY   Follow-Up: AFTER DCCV   Any Other Special Instructions Will Be Listed Below (If Applicable).

## 2015-03-31 ENCOUNTER — Ambulatory Visit (HOSPITAL_COMMUNITY): Payer: Commercial Managed Care - PPO | Admitting: Anesthesiology

## 2015-03-31 ENCOUNTER — Telehealth: Payer: Self-pay | Admitting: *Deleted

## 2015-03-31 ENCOUNTER — Encounter (HOSPITAL_COMMUNITY)
Admission: RE | Disposition: A | Discharge status: Home / Self Care | Payer: Self-pay | Source: Ambulatory Visit | Attending: Gastroenterology

## 2015-03-31 ENCOUNTER — Ambulatory Visit (HOSPITAL_COMMUNITY)
Admission: RE | Admit: 2015-03-31 | Discharge: 2015-03-31 | Disposition: A | Discharge status: Home / Self Care | Payer: Commercial Managed Care - PPO | Source: Ambulatory Visit | Attending: Gastroenterology | Admitting: Gastroenterology

## 2015-03-31 ENCOUNTER — Encounter (HOSPITAL_COMMUNITY): Payer: Self-pay | Admitting: Anesthesiology

## 2015-03-31 DIAGNOSIS — F17210 Nicotine dependence, cigarettes, uncomplicated: Secondary | ICD-10-CM | POA: Diagnosis not present

## 2015-03-31 DIAGNOSIS — I129 Hypertensive chronic kidney disease with stage 1 through stage 4 chronic kidney disease, or unspecified chronic kidney disease: Secondary | ICD-10-CM | POA: Insufficient documentation

## 2015-03-31 DIAGNOSIS — Z981 Arthrodesis status: Secondary | ICD-10-CM | POA: Insufficient documentation

## 2015-03-31 DIAGNOSIS — I2699 Other pulmonary embolism without acute cor pulmonale: Secondary | ICD-10-CM | POA: Insufficient documentation

## 2015-03-31 DIAGNOSIS — Z90710 Acquired absence of both cervix and uterus: Secondary | ICD-10-CM | POA: Insufficient documentation

## 2015-03-31 DIAGNOSIS — Z9851 Tubal ligation status: Secondary | ICD-10-CM | POA: Diagnosis not present

## 2015-03-31 DIAGNOSIS — Z9049 Acquired absence of other specified parts of digestive tract: Secondary | ICD-10-CM | POA: Diagnosis not present

## 2015-03-31 DIAGNOSIS — Z923 Personal history of irradiation: Secondary | ICD-10-CM | POA: Diagnosis not present

## 2015-03-31 DIAGNOSIS — Z794 Long term (current) use of insulin: Secondary | ICD-10-CM | POA: Insufficient documentation

## 2015-03-31 DIAGNOSIS — C159 Malignant neoplasm of esophagus, unspecified: Secondary | ICD-10-CM

## 2015-03-31 DIAGNOSIS — C160 Malignant neoplasm of cardia: Secondary | ICD-10-CM | POA: Insufficient documentation

## 2015-03-31 DIAGNOSIS — E119 Type 2 diabetes mellitus without complications: Secondary | ICD-10-CM | POA: Insufficient documentation

## 2015-03-31 DIAGNOSIS — N182 Chronic kidney disease, stage 2 (mild): Secondary | ICD-10-CM | POA: Diagnosis not present

## 2015-03-31 HISTORY — PX: EUS: SHX5427

## 2015-03-31 HISTORY — DX: Personal history of other medical treatment: Z92.89

## 2015-03-31 LAB — CBC WITH DIFFERENTIAL/PLATELET
Basophils Absolute: 0.1 10*3/uL (ref 0.0–0.1)
Basophils Relative: 0.5 % (ref 0.0–3.0)
EOS PCT: 0.5 % (ref 0.0–5.0)
Eosinophils Absolute: 0.1 10*3/uL (ref 0.0–0.7)
HEMATOCRIT: 40 % (ref 36.0–46.0)
Hemoglobin: 13.4 g/dL (ref 12.0–15.0)
Lymphocytes Relative: 21.9 % (ref 12.0–46.0)
Lymphs Abs: 2.3 10*3/uL (ref 0.7–4.0)
MCHC: 33.6 g/dL (ref 30.0–36.0)
MCV: 90.9 fl (ref 78.0–100.0)
MONOS PCT: 4.7 % (ref 3.0–12.0)
Monocytes Absolute: 0.5 10*3/uL (ref 0.1–1.0)
NEUTROS ABS: 7.7 10*3/uL (ref 1.4–7.7)
NEUTROS PCT: 72.4 % (ref 43.0–77.0)
PLATELETS: 255 10*3/uL (ref 150.0–400.0)
RBC: 4.4 Mil/uL (ref 3.87–5.11)
RDW: 14 % (ref 11.5–15.5)
WBC: 10.7 10*3/uL — ABNORMAL HIGH (ref 4.0–10.5)

## 2015-03-31 LAB — BASIC METABOLIC PANEL
BUN: 25 mg/dL — AB (ref 6–23)
CHLORIDE: 102 meq/L (ref 96–112)
CO2: 26 mEq/L (ref 19–32)
Calcium: 9.4 mg/dL (ref 8.4–10.5)
Creatinine, Ser: 1.37 mg/dL — ABNORMAL HIGH (ref 0.40–1.20)
GFR: 40.68 mL/min — ABNORMAL LOW (ref >60.00)
Glucose, Bld: 155 mg/dL — ABNORMAL HIGH (ref 70–99)
POTASSIUM: 4.3 meq/L (ref 3.5–5.1)
Sodium: 138 mEq/L (ref 135–145)

## 2015-03-31 LAB — GLUCOSE, CAPILLARY: Glucose-Capillary: 157 mg/dL — ABNORMAL HIGH (ref 65–99)

## 2015-03-31 SURGERY — UPPER ENDOSCOPIC ULTRASOUND (EUS) RADIAL
Anesthesia: Monitor Anesthesia Care

## 2015-03-31 MED ORDER — LACTATED RINGERS IV SOLN
INTRAVENOUS | Status: DC
  Administered 2015-03-31: 1000 mL via INTRAVENOUS
  Administered 2015-03-31: 07:00:00 via INTRAVENOUS

## 2015-03-31 MED ORDER — PROPOFOL 10 MG/ML IV BOLUS
INTRAVENOUS | Status: AC
  Filled 2015-03-31: qty 20

## 2015-03-31 MED ORDER — SODIUM CHLORIDE 0.9 % IV SOLN: INTRAVENOUS | Status: DC

## 2015-03-31 MED ORDER — EPHEDRINE SULFATE 50 MG/ML IJ SOLN
INTRAMUSCULAR | Status: AC
Start: 2015-03-31 — End: 2015-03-31
  Filled 2015-03-31: qty 1

## 2015-03-31 MED ORDER — LIDOCAINE HCL (CARDIAC) 20 MG/ML IV SOLN
INTRAVENOUS | Status: AC
  Filled 2015-03-31: qty 5

## 2015-03-31 MED ORDER — PROPOFOL INFUSION 10 MG/ML OPTIME
INTRAVENOUS | Status: DC | PRN
  Administered 2015-03-31: 100 ug/kg/min via INTRAVENOUS

## 2015-03-31 MED ORDER — PROPOFOL 500 MG/50ML IV EMUL
INTRAVENOUS | Status: DC | PRN
  Administered 2015-03-31: 40 mg via INTRAVENOUS

## 2015-03-31 MED ORDER — LISINOPRIL 10 MG PO TABS
10.0000 mg | ORAL_TABLET | Freq: Every day | ORAL | Status: DC
Start: 2015-03-31 — End: 2016-04-28

## 2015-03-31 MED ORDER — SODIUM CHLORIDE 0.9 % IJ SOLN
INTRAMUSCULAR | Status: AC
  Filled 2015-03-31: qty 10

## 2015-03-31 NOTE — Interval H&P Note (Signed)
History and Physical Interval Note:  03/31/2015 7:26 AM  Sharon Price Sharon Price  has presented today for surgery, with the diagnosis of esophageal cancer  The various methods of treatment have been discussed with the patient and family. After consideration of risks, benefits and other options for treatment, the patient has consented to  Procedure(s): UPPER ENDOSCOPIC ULTRASOUND (EUS) RADIAL (N/A) as a surgical intervention .  The patient's history has been reviewed, patient examined, no change in status, stable for surgery.  I have reviewed the patient's chart and labs.  Questions were answered to the patient's satisfaction.     Milus Banister

## 2015-03-31 NOTE — Anesthesia Preprocedure Evaluation (Addendum)
Anesthesia Evaluation  Patient identified by MRN, date of birth, ID band Patient awake    Reviewed: Allergy & Precautions, NPO status , Patient's Chart, lab work & pertinent test results  Airway Mallampati: III  TM Distance: >3 FB Neck ROM: Full    Dental no notable dental hx. (+) Edentulous Upper, Edentulous Lower   Pulmonary pneumonia -, Current Smoker, former smoker,  breath sounds clear to auscultation  Pulmonary exam normal       Cardiovascular hypertension, Pt. on medications + Peripheral Vascular Disease (Hx PE) Normal cardiovascular examRhythm:Regular Rate:Normal     Neuro/Psych negative neurological ROS     GI/Hepatic negative GI ROS, Neg liver ROS,   Endo/Other  diabetes, Type 2, Oral Hypoglycemic Agents, Insulin Dependent  Renal/GU negative Renal ROS     Musculoskeletal   Abdominal   Peds  Hematology negative hematology ROS (+)   Anesthesia Other Findings   Reproductive/Obstetrics                            Anesthesia Physical  Anesthesia Plan  ASA: III  Anesthesia Plan: MAC   Post-op Pain Management:    Induction: Intravenous  Airway Management Planned: Nasal Cannula and Natural Airway  Additional Equipment:   Intra-op Plan:   Post-operative Plan:   Informed Consent: I have reviewed the patients History and Physical, chart, labs and discussed the procedure including the risks, benefits and alternatives for the proposed anesthesia with the patient or authorized representative who has indicated his/her understanding and acceptance.     Plan Discussed with: CRNA  Anesthesia Plan Comments:         Anesthesia Quick Evaluation

## 2015-03-31 NOTE — H&P (View-Only) (Signed)
[address]       [place] [address]             [place] Record #[id number] Date of Birth: 1946-05-18  Referring: Marice Potter, MD Primary Care: Lillard Anes, MD  Chief Complaint:    Chief Complaint  Patient presents with  . Follow-up    to discuss surgery further    History of Present Illness:    Sharon Price 69 y.o. 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 The patient has completed neoadjuvant chemotherapy radiation with weekly carboplatin and and paclltaxel in AUG 2015. Her treatment course has been complicated with severe dehydration poor nutrition, requiring a PEG tube to be placed which  Became  infected and had to be replaced several times. The patient needed supplementary IV fluids through a PICC line.   Overall she is markedly improved since the last time I saw her. Her weight has increased from 130-to 151, she's taking a by mouth diet without difficulty. She stopped smoking 3 weeks ago    Patient is noted on  CT scan 07/21/2014 to have evidence of pulmonary emboli, she is now on  xarelto  Repeat endoscopy in [place] is reported to confirm recurrent or persistent carcinoma in the distal esophagus on biopsy.   Current Activity/ Functional Status:  Patient is independent with mobility/ambulation, transfers, ADL's, IADL's.   Zubrod Score: At the time of surgery this patient's most appropriate activity status/level should be described as: []     0    Normal activity, no symptoms [x]     1    Restricted in physical strenuous activity but ambulatory, able to do out light work []     2    Ambulatory and capable of self care, unable to do work activities, up and about               >50 % of waking hours                              []     3    Only limited self care, in bed greater than  50% of waking hours []     4    Completely disabled, no self care, confined to bed or chair []     5    Moribund   Past Medical History  Diagnosis Date  . Diabetes mellitus without complication   . Chronic kidney disease     renal..STAGE 2  . Hypertension   . Mechanical dysphagia     MOSTLY SOLIDS  . Pulmonary emboli 9/223/15 [facility]    RIGHT LOWER LOBE PER CT CHEST   . Patient on combined chemotherapy and radiation     [facility]  . Cancer 03/30/14    GE JUNCTION    Past Surgical History  Procedure Laterality Date  . Back surgery    . Abdominal hysterectomy    . Tubal ligation    . Eus N/A 04/08/2014    Procedure: ESOPHAGEAL ENDOSCOPIC ULTRASOUND (EUS) RADIAL;  Surgeon: Milus Banister, MD;  Location: [facility] ENDOSCOPY;  Service: Endoscopy;  Laterality: N/A;  . Appendectomy    .  Spine surgery    . Lumbar fusion    . Esophagogastroduodenoscopy (egd) with propofol N/A 12/02/2014    Procedure: ESOPHAGOGASTRODUODENOSCOPY (EGD) WITH PROPOFOL;  Surgeon: Jerene Bears, MD;  Location: [facility] ENDOSCOPY;  Service: Endoscopy;  Laterality: N/A;    Family History  Problem Relation Age of Onset  . Diabetes Mother   . Hypertension Mother   . Stroke Mother   . Heart disease Mother   . Hypertension Father   . Diabetes Sister   . Cancer Sister     THYROID  . Cancer Brother     LUNG  . Diabetes Daughter   . Diabetes Son     History   Social History  . Marital Status: Widowed    Spouse Name: N/A  . Number of Children: N/A  . Years of Education: N/A   Occupational History  . Not on file.   Social History Main Topics  . Smoking status: Light Tobacco Smoker  . Smokeless tobacco: Not on file  . Alcohol Use: No  . Drug Use: No  . Sexual Activity: Not on file   Other Topics Concern  . Not on file   Social History Narrative    History  Smoking status  . Light Tobacco Smoker  Smokeless tobacco  . Not on file    History  Alcohol  Use No     No Known Allergies  Current Outpatient Prescriptions  Medication Sig Dispense Refill  . gabapentin (NEURONTIN) 300 MG capsule Take 300 mg by mouth 4 (four) times daily.    Marland Kitchen HYDROcodone-acetaminophen (NORCO/VICODIN) 5-325 MG per tablet Take 1 tablet by mouth every 4 (four) hours as needed for moderate pain. 65 tablet 0  . insulin glargine (LANTUS) 100 UNIT/ML injection Inject 0.1 mLs (10 Units total) into the skin at bedtime. 10 mL 5  . lisinopril (PRINIVIL,ZESTRIL) 20 MG tablet Take 1 tablet (20 mg total) by mouth daily. 30 tablet 1  . LORazepam (ATIVAN) 0.5 MG tablet Take 1 tablet (0.5 mg total) by mouth every 8 (eight) hours as needed for anxiety. 30 tablet 0  . pantoprazole (PROTONIX) 40 MG tablet Take 1 tablet (40 mg total) by mouth 2 (two) times daily before a meal. 60 tablet 1  . pravastatin (PRAVACHOL) 20 MG tablet Take 20 mg by mouth every morning.    . rivaroxaban (XARELTO) 20 MG TABS tablet Take 20 mg by mouth daily with supper.    . sennosides (SENOKOT) 8.8 MG/5ML syrup Take 10 mLs by mouth 2 (two) times daily. 240 mL 5  . sucralfate (CARAFATE) 1 GM/10ML suspension Take 10 mLs (1 g total) by mouth 4 (four) times daily -  before meals and at bedtime. 420 mL 5   No current facility-administered medications for this visit.     Review of Systems:     Cardiac Review of Systems: Y or N  Chest Pain [  y  ]  Resting SOB [ n  ] Exertional SOB  [ y ]  Orthopnea [  ]   Pedal Edema [   ]    Palpitations [  ] Syncope  [  ]   Presyncope [   ]  General Review of Systems: [Y] = yes [  ]=no Constitional: recent weight change [ gained wt ];  Wt loss over the last 3 months [ ]  anorexia [  ]; fatigue [ y ]; nausea [ y ]; night sweats [  ]; fever [  ]; or chills [  ];  Dental: poor dentition[ n ]; Last Dentist visit:   Eye : blurred vision [  ]; diplopia [   ]; vision changes [  ];  Amaurosis fugax[  ]; Resp: cough [  ];  wheezing[  ];  hemoptysis[ n ]; shortness of breath[ n  ]; paroxysmal nocturnal dyspnea[  ]; dyspnea on exertion[ y ]; or orthopnea[  ];  GI:  gallstones[  ], vomiting[n  ];  dysphagia[y  ]; melena[  ];  hematochezia [ y ]; heartburn[ y ];   Hx of  Colonoscopy[  ]; GU: kidney stones [  ]; hematuria[  ];   dysuria [  ];  nocturia[  ];  history of     obstruction [  ]; urinary frequency [  ]             Skin: rash, swelling[  ];, hair loss[  ];  peripheral edema[  ];  or itching[  ]; Musculosketetal: myalgias[  ];  joint swelling[  ];  joint erythema[  ];  joint pain[  ];  back pain[  ];  Heme/Lymph: bruising[  ];  bleeding[n  ];  anemia[  ];  Neuro: TIA[  ];  headaches[  ];  stroke[  ];  vertigo[  ];  seizures[  ];   paresthesias[  ];  difficulty walking[ n ];  Psych:depression[  ]; anxiety[ y ];  Endocrine: diabetes[  ];  thyroid dysfunction[  ];  Immunizations: Flu up to date [ n ]; Pneumococcal up to date Florencio.Farrier  ];  Other:  Physical Exam: BP 110/68 mmHg  Resp 16  Ht 5\' 1"  (1.549 m)  Wt 151 lb (68.493 kg)  BMI 28.55 kg/m2   Wt Readings from Last 3 Encounters:  03/15/15 151 lb (68.493 kg)  11/30/14 157 lb 3 oz (71.3 kg)  09/16/14 151 lb (68.493 kg)   weight up from 132  PHYSICAL EXAMINATION:  General appearance: alert, cooperative, appears older than stated age, cachectic and fatigued Neurologic: intact Heart: irregularly irregular rhythm Lungs: diminished breath sounds bibasilar Abdomen: Abdomen mildly distended with intact G-tube in the left upper abdomen Extremities: extremities normal, atraumatic, no cyanosis or edema and Homans sign is negative, no sign of DVT Wound: There appears to be no infection around the I do not appreciate cervical or supraclavicular adenopathy PEG tubehas been removed   Diagnostic Studies & Laboratory data:     Recent Radiology Findings:  Final Report  03/07/2015  CLINICAL DATA: Esophageal cancer. Chemotherapy and radiation therapy completed August 2015.  EXAM: CT ABDOMEN AND PELVIS WITH  CONTRAST  TECHNIQUE: Multidetector CT imaging of the abdomen and pelvis was performed using the standard protocol following bolus administration of intravenous contrast.  CONTRAST: 100 mL Isovue  COMPARISON: PET-CT scan 04/21/2014, CT scan 11/16/2014  FINDINGS: Lower chest: Lung bases are clear.  Hepatobiliary: No focal hepatic lesion. The gallbladder is collapsed.  Pancreas: Pancreas is normal. No ductal dilatation. No pancreatic inflammation.  Spleen: Normal spleen  Adrenals/urinary tract: Mild nodularity of adrenal glands is unchanged. Kidneys are normal. The ureters and bladder normal.  Stomach/Bowel: There is thickening of the distal esophagus similar to prior. Stomach, small bowel, and colon are unremarkable.  Vascular/Lymphatic: Atherosclerotic calcification aorta. No periportal retroperitoneal lymphadenopathy. No gastrohepatic ligament lymphadenopathy. No pelvic adenopathy or inguinal adenopathy.  Reproductive: Post hysterectomy.  Musculoskeletal: No aggressive osseous lesion. Posterior lumbar fusion  Other: No peritoneal metastasis.  IMPRESSION: 1. No evidence of esophageal cancer recurrence or metastasis in the abdomen pelvis. 2.  Stable thickening distal esophagus likely relates radiation change. 3. Atherosclerotic calcification of the aorta.   Electronically Signed By: Suzy Bouchard M.D. On: 03/07/2015 16:43    CLINICAL DATA: Shortness of breath; history of esophageal malignancy, PEG tube placement, appendectomy, hysterectomy, and lumbar fusion.  EXAM: CT ANGIOGRAPHY CHEST  CT ABDOMEN AND PELVIS WITH CONTRAST  TECHNIQUE: Multidetector CT imaging of the chest was performed using the standard protocol during bolus administration of intravenous contrast. Multiplanar CT image reconstructions and MIPs were obtained to evaluate the vascular anatomy. Multidetector CT imaging of the abdomen and pelvis was performed using the standard  protocol during bolus administration of intravenous contrast.  CONTRAST: 100 cc of Isovue 370 intravenously. The patient also received oral contrast material through the PEG tube.  COMPARISON: Portable chest x-ray of today's date and PET-CT study of April 21, 2014 and CT scan of the chest and abdomen abdomen dated March 31, 2014  FINDINGS: CTA CHEST FINDINGS  There are small filling defects within peripheral pulmonary arterial branches to the right lower lobe. There are no filling defects elsewhere on the right nor on the left. The caliber of the thoracic aorta is normal. The cardiac chambers are normal in size. The RV LV ratio is approximately 1. There is no pericardial effusion. There is no lymphadenopathy. There is thickening of the wall of the lower 1/3 of the esophagus without visible discrete mass. There is no free mediastinal fluid or air.  At lung window settings there is no pulmonary parenchymal mass. There is increased density in the posterior medial aspect of the right costophrenic gutter which may reflect pneumonia or pulmonary infarction new since the previous studies. There is no pleural effusion nor pulmonary parenchymal mass.  There are degenerative changes of the lower thoracic discs. There is no compression fracture. The sternum and observed ribs exhibit no acute abnormalities.  CT ABDOMEN and PELVIS FINDINGS  The liver, gallbladder, pancreas, spleen, right adrenal gland, and kidneys are normal. There is stable mild enlargement of the left adrenal gland. There is no bulky periaortic or pericaval lymphadenopathy. The abdominal aorta exhibits mural thrombus and calcified plaque, but no evidence of aneurysm. The stomach contains a PEG tube and is of partially distended with gas. The GE junction region remains prominent. No perigastric lymphadenopathy is demonstrated. The small bowel is normal. There is a moderate stool burden within the colon without evidence of  obstruction. The rectum is mildly distended with gas and stool. The urinary bladder is unremarkable. The uterus is surgically absent. The patient has undergone previous posterior fusion at L3-4 and L4-5. The vertebral bodies are preserved in height. The bony pelvis is unremarkable.  Review of the MIP images confirms the above findings.  IMPRESSION: 1. There are small emboli within branches of the right lower lobe pulmonary artery posteriorly without evidence of significant right heart strain. There is small amount of adjacent presumed infarct versus pneumonia. 2. There is mild thickening of the wall of the distal third of the esophagus without evidence of a discrete mass or perforation. 3. There is no evidence of CHF nor pulmonary parenchymal masses. There is no pleural effusion. 4. No acute intra-abdominal abnormality is demonstrated. There are atherosclerotic changes of the abdominal aorta without evidence of dissection or aneurysm. There is stable mild enlargement of the left adrenal gland. 5. These results were called by telephone at the time of interpretation on 07/21/2014 at 12:59 pm to Dr. Isla Pence MD, who verbally acknowledged these results.   Electronically Signed By:  David Martinique On: 07/21/2014 12:59   CLINICAL DATA: Initial treatment strategy for esophageal carcinoma.  EXAM: NUCLEAR MEDICINE PET SKULL BASE TO THIGH  TECHNIQUE: 11.0 mCi F-18 FDG was injected intravenously. Full-ring PET imaging was performed from the skull base to thigh after the radiotracer. CT data was obtained and used for attenuation correction and anatomic localization.  FASTING BLOOD GLUCOSE: Value: 73 mg/dl  COMPARISON: CT 03/31/2014  FINDINGS: NECK  No hypermetabolic lymph nodes in the neck.  CHEST  There is a long segment of intense hypermetabolic activity associated with the esophagus. This extends from relate just superior to the carina through the GE junction with SUV  max equal 8.5. There is a hypermetabolic mass at the esophageal junction extending to the gastric cardia with SUV max equals 17.3. No hypermetabolic mediastinal lymph nodes. No suspicious pulmonary nodules.  ABDOMEN/PELVIS  Hypermetabolic mass in the gastric cardia described above. There is no hypermetabolic gastro hepatic ligament lymph nodes. No abnormal metabolic activity within the liver.  No hypermetabolic abdominal pelvic lymph nodes  SKELETON  No focal hypermetabolic activity to suggest skeletal metastasis.  IMPRESSION: 1. Hypermetabolic mass in the gastric cardia and esophageal junction consists with primary esophageal / gastric carcinoma. 2. Long segment intense metabolic activity associated with the mid and distal esophagus. Differential includes esophageal carcinoma versus esophagitis. 3. No evidence of hypermetabolic mediastinal or gastrohepatic ligament lymph nodes. 4. No evidence of liver metastasis. 5. No distant metastasis   Electronically Signed By: Suzy Bouchard M.D. On: 04/21/2014 13:57    CLINICAL DATA: Difficulty swallowing and epigastric pain. Esophageal disorder. Prior appendectomy and hysterectomy.  EXAM: CT CHEST AND ABDOMEN WITH CONTRAST  TECHNIQUE: Multidetector CT imaging of the chest and abdomen was performed following the standard protocol during bolus administration of intravenous contrast.  CONTRAST: 80 cc Isovue 370  COMPARISON: Abdominal pelvic CT 02/10/2014. Chest radiograph 04/07/2013. Chest CT 04/23/2008.  FINDINGS: CT CHEST FINDINGS  Lungs/Pleura: No nodules or airspace opacities. No pleural fluid.  Heart/Mediastinum: No supraclavicular adenopathy. Mildly age advanced aortic and branch vessel atherosclerosis. Normal heart size with lipomatous hypertrophy of the interatrial septum. Multivessel coronary artery atherosclerosis. No mediastinal or hilar adenopathy. Air contrast level in the thoracic esophagus on image 28  of series 2 and more superiorly on image 19/series 2.  CT ABDOMEN FINDINGS  Abdomen: Normal liver, spleen. Redemonstration of soft tissue fullness involving the gastroesophageal junction and proximal stomach. Example images 37-42 of series 2. Suspect fluid/gastric contents with soft tissue density in the gastric cardia/body junction on image 39/series 2.  Normal pancreas, gallbladder, biliary tract. Mild right adrenal thickening and left adrenal nodularity are grossly similar back to 2009. Suspect an underlying left adrenal adenoma at 1.3 cm. 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 Normal right kidney. Advanced aortic and branch vessel atherosclerosis. Ulcerative plaque within the infrarenal aorta, including on image 62/series 2. Beam hardening artifact from spinal hardware. No retroperitoneal or retrocrural adenopathy. Normal colon and terminal ileum. Normal abdominal small bowel without ascites. No evidence of omental or peritoneal disease.  Bones/Musculoskeletal: L3-5 lumbar spine fixation. Lower thoracic degenerative disc disease.  IMPRESSION: CT CHEST IMPRESSION  1. No acute process in the chest. 2. Age advanced coronary artery atherosclerosis. Recommend assessment of coronary risk factors and consideration of medical therapy. 3. Mildly dilated thoracic esophagus with contrast within. This suggests a component of esophageal obstruction, dysmotility, or gastroesophageal reflux disease.  CT ABDOMEN AND PELVIS IMPRESSION  1. Redemonstration of soft tissue fullness at the distal esophagus and proximal  stomach. Cannot exclude gastritis or gastroesophageal carcinoma. If not already performed, endoscopy is recommended. 2. Advanced abdominal aortic and branch vessel atherosclerosis.   Electronically Signed By: Abigail Miyamoto M.D. On: 03/31/2014 14:14   Pretreatment EUS: Endoscopic findings: 1. Malignant mass in distal esophagus. The mass was  circumferential, patially obstructing but I was able to fairly easily advance echoendoscope through the strictured lumen. The proximal edge was 34 cm from the incisors and distal edge (just below the GE junction) was at 39 cm. The mass is 5cm long. EUS findings: 1. The mass above corresponded with a hypoechoic, heterogeneous mass that clearly passes into and through the muscularis propria layer of the esophageal wall (uT3). 2. There were two round, well demarcated, 5-46mm, hypoechoic paraesophageal lymphnodes that lay directly adjacent to the mass that are suspicious for malignant involvement (uN1). 3. No celiac adenopathy. Impression: uT3N1 (clinical stage IIIa) 5cm long, cirumferential GE junction adenocarcinoma with proximal edge at 34cm from incisors and distal edge just below the GE junction.  Recent Lab Findings: Lab Results  Component Value Date   WBC 3.9* 12/07/2014      Assessment / Plan:   Patient appearstoo dramatically improved since the last time I saw her . she has completed radiation and chemotherapy for esophageal cancer at least IIIa, recent CT scan shows  pulmonary  but no evidence of widespread metastatic disease. At this point with recurrent local disease in her functional status much improved we can consider surgical resection. I discussed this with the patient and her daughter. To fully evaluate her current operative status we will Obtain the path report and operative report from recent endoscopy Asked Dr. Ardis Hughs to repeat esophageal ultrasound/EUS Obtain a PET scan Because of the patient's long-term smoking history and history of diabetes obtain cardiac clearance Obtain recent pulmonary function studies We'll discuss with Dr. Bobby Rumpf the history of pulmonary emboli and whether the patient should have a caval filter placed prior to surgery Plan to see her back in approximate 2 weeks after the above studies have been completed  I  spent 35 minutes counseling the  patient face to face and 50% or more the  time was spent in counseling and coordination of care. The total time spent in the appointment was 45 minutes.  Grace Isaac MD      [address] [place] [address] Office [phone]   Beeper [phone]  03/15/2015 3:54 PM

## 2015-03-31 NOTE — Discharge Instructions (Signed)

## 2015-03-31 NOTE — Op Note (Signed)
[place], [address]   ENDOSCOPIC ULTRASOUND PROCEDURE REPORT  PATIENT: Sharon, Price  MR#: [id number] BIRTHDATE: [birth year]  GENDER: female ENDOSCOPIST: Milus Banister, MD REFERRED BY:  Lanelle Bal, M.D. PROCEDURE DATE:  03/31/2015 PROCEDURE:   Upper EUS ASA CLASS:      Class III INDICATIONS:   1.  Stage IIIa GE junction adenocarcinoma (originally diagnosed 2015 Dr.  Lyda Jester, s/p neoadjuvant treatment, here for restaging). MEDICATIONS: Monitored anesthesia care  DESCRIPTION OF PROCEDURE:   After the risks benefits and alternatives of the procedure were  explained, informed consent was obtained. The patient was then placed in the left, lateral, decubitus postion and IV sedation was administered. Throughout the procedure, the patients blood pressure, pulse and oxygen saturations were monitored continuously.  Under direct visualization, the Pentax Radial EUS [id number]  endoscope was introduced through the mouth  and advanced to the    .  Water was used as necessary to provide an acoustic interface.  Upon completion of the imaging, water was removed and the patient was sent to the recovery room in satisfactory condition.  Endoscopic findings: 1. The previously noted (2015) GE junction malignancy was smaller now, but still clearly present. It was 2-3cm long, non-circumferential, ulcerated, located at GE junction (37cm from incisors).  EUS findings: 1. The mass above correlated with a hypoechoic, heterogeneous lesion that clearly passed into and through the muscularis propria layer of the distal esophagus, GE junction wall (uT3). 2. The was no paraesophageal, mediastinal, celiac adenopathy (uN0).   ENDOSCOPIC IMPRESSION: uT3N0 (stage IIa) non-circumferential, 2-3cm long, ulcerated GE junction adenocarcinoma. This appears to have responded to neoadjuvant chemo (was previously staged IIIa) and so she may be a candidate for  surgical resection.  RECOMMENDATIONS: Will forward to Dr. Servando Snare  _______________________________ eSigned:  Milus Banister, MD 03/31/2015 7:57 AM

## 2015-03-31 NOTE — Telephone Encounter (Signed)
Contacted the pt to inform her that her lab results that were done yesterday indicated her kidney function (elevated BUN 25 and CREATININE 1.37) has increased from 3 months ago.  Informed the pt that I showed our DOD Flex Cecilie Kicks NP the pts lab results, and she recommends that we decrease her Lisinopril from 20 mg to 10 mg po daily to help with elevated kidney function.  Informed the pt that Dr Johnsie Cancel is out of the office today but I  will notify him of new dose change of Lisinopril. Confirmed the pharmacy of choice with the pt.  Pt verbalized understanding and agrees with this plan.

## 2015-03-31 NOTE — Transfer of Care (Signed)
Immediate Anesthesia Transfer of Care Note  Patient: Sharon Price  Procedure(s) Performed: Procedure(s): UPPER ENDOSCOPIC ULTRASOUND (EUS) RADIAL (N/A)  Patient Location: PACU  Anesthesia Type:MAC  Level of Consciousness: awake, alert  and oriented  Airway & Oxygen Therapy: Patient Spontanous Breathing and Patient connected to nasal cannula oxygen  Post-op Assessment: Report given to RN and Post -op Vital signs reviewed and stable  Post vital signs: Reviewed and stable  Last Vitals:  Filed Vitals:   03/31/15 0637  BP: 129/69  Temp: 36.8 C  Resp: 16    Complications: No apparent anesthesia complications

## 2015-03-31 NOTE — Anesthesia Postprocedure Evaluation (Signed)
  Anesthesia Post-op Note  Patient: Sharon Price  Procedure(s) Performed: Procedure(s) (LRB): UPPER ENDOSCOPIC ULTRASOUND (EUS) RADIAL (N/A)  Patient Location: PACU  Anesthesia Type: MAC  Level of Consciousness: awake and alert   Airway and Oxygen Therapy: Patient Spontanous Breathing  Post-op Pain: mild  Post-op Assessment: Post-op Vital signs reviewed, Patient's Cardiovascular Status Stable, Respiratory Function Stable, Patent Airway and No signs of Nausea or vomiting  Last Vitals:  Filed Vitals:   03/31/15 0810  BP: 136/64  Pulse: 83  Temp:   Resp: 17    Post-op Vital Signs: stable   Complications: No apparent anesthesia complications

## 2015-04-01 ENCOUNTER — Other Ambulatory Visit: Payer: Self-pay

## 2015-04-01 ENCOUNTER — Other Ambulatory Visit (HOSPITAL_COMMUNITY): Payer: Commercial Managed Care - PPO

## 2015-04-01 ENCOUNTER — Ambulatory Visit (HOSPITAL_COMMUNITY): Payer: Commercial Managed Care - PPO | Attending: Internal Medicine

## 2015-04-01 DIAGNOSIS — I340 Nonrheumatic mitral (valve) insufficiency: Secondary | ICD-10-CM | POA: Diagnosis not present

## 2015-04-01 DIAGNOSIS — I313 Pericardial effusion (noninflammatory): Secondary | ICD-10-CM | POA: Diagnosis not present

## 2015-04-01 DIAGNOSIS — I351 Nonrheumatic aortic (valve) insufficiency: Secondary | ICD-10-CM | POA: Diagnosis not present

## 2015-04-01 DIAGNOSIS — I4891 Unspecified atrial fibrillation: Secondary | ICD-10-CM | POA: Diagnosis not present

## 2015-04-04 ENCOUNTER — Ambulatory Visit (INDEPENDENT_AMBULATORY_CARE_PROVIDER_SITE_OTHER): Payer: Commercial Managed Care - PPO | Admitting: Cardiothoracic Surgery

## 2015-04-04 ENCOUNTER — Encounter (HOSPITAL_COMMUNITY): Payer: Self-pay | Admitting: Gastroenterology

## 2015-04-04 VITALS — BP 135/79 | HR 88 | Resp 20 | Ht 61.0 in | Wt 156.0 lb

## 2015-04-04 DIAGNOSIS — Z9221 Personal history of antineoplastic chemotherapy: Secondary | ICD-10-CM

## 2015-04-04 DIAGNOSIS — Z5189 Encounter for other specified aftercare: Secondary | ICD-10-CM | POA: Diagnosis not present

## 2015-04-04 DIAGNOSIS — C159 Malignant neoplasm of esophagus, unspecified: Secondary | ICD-10-CM | POA: Diagnosis not present

## 2015-04-04 DIAGNOSIS — Z923 Personal history of irradiation: Secondary | ICD-10-CM

## 2015-04-04 NOTE — Progress Notes (Signed)
[address]       [place], [address]             [place] Record #[id number] Date of Birth: [birth year]  Referring: Marice Potter, MD Primary Care: Lillard Anes, MD  Chief Complaint:    Chief Complaint  Patient presents with  . Esophageal Cancer    s/p PET Scan, PFT's and Cardiac clearance    History of Present Illness:    BRITHNEY BENSEN 69 y.o. 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 The patient has completed neoadjuvant chemotherapy radiation with weekly carboplatin and and paclltaxel in AUG 2015. Her treatment course has been complicated with severe dehydration poor nutrition, requiring a PEG tube to be placed which  Became  infected and had to be replaced several times. The patient needed supplementary IV fluids through a PICC line.   She is markedly improved . Her weight has increased from 130-to 151, she's taking a by mouth diet without difficulty. She is still somoking "some".   Patient is noted on  CT scan 07/21/2014 to have evidence of pulmonary emboli, she is now on  xarelto  Repeat endoscopy in [place] is reported to confirm recurrent or persistent carcinoma in the distal esophagus on biopsy. Repeat endoscopy 03/31/2015 [id number] GE mass bx infiltrating moderate to poorly differentiated  Adenocarcinoma.   Patient has improved from when first seen and is being considered for resection, Repeat EUS and PET done.  I sent her for cardiology clearance. Dr Frances Nickels saw her , she complained her heart rate was fast and she was found to be in atrial flutter. She is scheduled for cardioversion later this week.    Current Activity/ Functional Status:  Patient is independent with mobility/ambulation, transfers, ADL's, IADL's.   Zubrod Score: At the time of surgery this patient's most appropriate  activity status/level should be described as: []     0    Normal activity, no symptoms [x]     1    Restricted in physical strenuous activity but ambulatory, able to do out light work []     2    Ambulatory and capable of self care, unable to do work activities, up and about               >50 % of waking hours                              []     3    Only limited self care, in bed greater than 50% of waking hours []     4    Completely disabled, no self care, confined to bed or chair []     5    Moribund   Past Medical History  Diagnosis Date  . Diabetes mellitus without complication   . Chronic kidney disease     renal..STAGE 2  . Hypertension   . Mechanical dysphagia     MOSTLY SOLIDS  . Pulmonary emboli 9/223/15 [place]    RIGHT LOWER LOBE PER CT CHEST   . Patient on combined chemotherapy and radiation     [facility]  . Cancer 03/30/14    GE  JUNCTION  . Transfusion history     ?[facility].    Past Surgical History  Procedure Laterality Date  . Back surgery    . Abdominal hysterectomy    . Tubal ligation    . Eus N/A 04/08/2014    Procedure: ESOPHAGEAL ENDOSCOPIC ULTRASOUND (EUS) RADIAL;  Surgeon: Milus Banister, MD;  Location: [facility] END[facility]COPY;  Service: Endoscopy;  Laterality: N/A;  . Appendectomy    . Spine surgery    . Lumbar fusion    . Esophagogastroduodenoscopy (egd) with propofol N/A 12/02/2014    Procedure: ESOPHAGOGASTRODUODENOSCOPY (EGD) WITH PROPOFOL;  Surgeon: Jerene Bears, MD;  Location: [facility] END[facility];  Service: Endoscopy;  Laterality: N/A;  . Eus N/A 03/31/2015    Procedure: UPPER ENDOSCOPIC ULTRASOUND (EUS) RADIAL;  Surgeon: Milus Banister, MD;  Location: [facility] END[facility];  Service: Endoscopy;  Laterality: N/A;    Family History  Problem Relation Age of Onset  . Diabetes Mother   . Hypertension Mother   . Stroke Mother   . Heart disease Mother   . Hypertension Father   . Diabetes Sister   . Cancer Sister       THYROID  . Cancer Brother     LUNG  . Diabetes Daughter   . Diabetes Son     History   Social History  . Marital Status: Widowed    Spouse Name: N/A  . Number of Children: N/A  . Years of Education: N/A   Occupational History  . Not on file.   Social History Main Topics  . Smoking status: Former Smoker -- 1.00 packs/day for 20 years    Quit date: 03/02/2015  . Smokeless tobacco: Not on file     Comment: 3 weeks- < 1ppd.  . Alcohol Use: No  . Drug Use: No  . Sexual Activity: Not on file   Other Topics Concern  . Not on file   Social History Narrative    History  Smoking status  . Former Smoker -- 1.00 packs/day for 20 years  . Quit date: 03/02/2015  Smokeless tobacco  . Not on file    Comment: 3 weeks- < 1ppd.    History  Alcohol Use No     No Known Allergies  Current Outpatient Prescriptions  Medication Sig Dispense Refill  . acetaminophen (TYLENOL) 500 MG tablet Take 1,000 mg by mouth every 6 (six) hours as needed for moderate pain or headache.    . Albuterol Sulfate (PROAIR RESPICLICK IN) Inhale into the lungs as directed.    . ergocalciferol (VITAMIN D2) 50000 UNITS capsule Take 50,000 Units by mouth every 30 (thirty) days. Takes around the 1st or 2nd of month.    . gabapentin (NEURONTIN) 300 MG capsule Take 300 mg by mouth 4 (four) times daily.    Marland Kitchen HYDROcodone-acetaminophen (NORCO/VICODIN) 5-325 MG per tablet Take 1 tablet by mouth every 4 (four) hours as needed for moderate pain. 65 tablet 0  . insulin glargine (LANTUS) 100 UNIT/ML injection Inject 0.1 mLs (10 Units total) into the skin at bedtime. 10 mL 5  . lisinopril (PRINIVIL,ZESTRIL) 10 MG tablet Take 1 tablet (10 mg total) by mouth daily. 90 tablet 3  . LORazepam (ATIVAN) 0.5 MG tablet Take 1 tablet (0.5 mg total) by mouth every 8 (eight) hours as needed for anxiety. 30 tablet 0  . metFORMIN (GLUCOPHAGE) 500 MG tablet Take 1 tablet (500 mg total) by mouth 2 (two) times daily with a meal.    .  metoprolol  tartrate (LOPRESSOR) 25 MG tablet Take 1 tablet (25 mg total) by mouth 2 (two) times daily. 60 tablet 11  . Nepafenac (ILEVRO OP) Place 1 drop into the right eye at bedtime.    . pantoprazole (PROTONIX) 40 MG tablet Take 1 tablet (40 mg total) by mouth 2 (two) times daily before a meal. 60 tablet 1  . rivaroxaban (XARELTO) 20 MG TABS tablet Take 20 mg by mouth daily with supper.    . sennosides (SENOKOT) 8.8 MG/5ML syrup Take 10 mLs by mouth 2 (two) times daily. 240 mL 5  . simvastatin (ZOCOR) 40 MG tablet Take 40 mg by mouth daily.    . sucralfate (CARAFATE) 1 GM/10ML suspension Take 10 mLs (1 g total) by mouth 4 (four) times daily -  before meals and at bedtime. 420 mL 5   No current facility-administered medications for this visit.     Review of Systems:     Cardiac Review of Systems: Y or N  Chest Pain [  y  ]  Resting SOB [ n  ] Exertional SOB  [ y ]  Orthopnea [  ]   Pedal Edema [   ]    Palpitations [  ] Syncope  [  ]   Presyncope [   ]  General Review of Systems: [Y] = yes [  ]=no Constitional: recent weight change [ gained wt ];  Wt loss over the last 3 months [ ]  anorexia [  ]; fatigue [ y ]; nausea [ y ]; night sweats [  ]; fever [  ]; or chills [  ];          Dental: poor dentition[ n ]; Last Dentist visit:   Eye : blurred vision [  ]; diplopia [   ]; vision changes [  ];  Amaurosis fugax[  ]; Resp: cough [  ];  wheezing[  ];  hemoptysis[ n ]; shortness of breath[ n ]; paroxysmal nocturnal dyspnea[  ]; dyspnea on exertion[ y ]; or orthopnea[  ];  GI:  gallstones[  ], vomiting[n  ];  dysphagia[y  ]; melena[  ];  hematochezia [ y ]; heartburn[ y ];   Hx of  Colonoscopy[  ]; GU: kidney stones [  ]; hematuria[  ];   dysuria [  ];  nocturia[  ];  history of     obstruction [  ]; urinary frequency [  ]             Skin: rash, swelling[  ];, hair loss[  ];  peripheral edema[  ];  or itching[  ]; Musculosketetal: myalgias[  ];  joint swelling[  ];  joint erythema[  ];  joint  pain[  ];  back pain[  ];  Heme/Lymph: bruising[  ];  bleeding[n  ];  anemia[  ];  Neuro: TIA[  ];  headaches[  ];  stroke[  ];  vertigo[  ];  seizures[  ];   paresthesias[  ];  difficulty walking[ n ];  Psych:depression[  ]; anxiety[ y ];  Endocrine: diabetes[  ];  thyroid dysfunction[  ];  Immunizations: Flu up to date [ n ]; Pneumococcal up to date Florencio.Farrier  ];  Other:  Physical Exam: BP 135/79 mmHg  Pulse 88  Resp 20  Ht 5\' 1"  (1.549 m)  Wt 156 lb (70.761 kg)  BMI 29.49 kg/m2  SpO2 97%   Wt Readings from Last 3 Encounters:  04/04/15 156 lb (70.761 kg)  03/31/15 151  lb (68.493 kg)  03/30/15 151 lb (68.493 kg)   weight up from 132  PHYSICAL EXAMINATION: BP 135/79 mmHg  Pulse 88  Resp 20  Ht 5\' 1"  (1.549 m)  Wt 156 lb (70.761 kg)  BMI 29.49 kg/m2  SpO2 97%   General appearance: alert, cooperative, appears older than stated age, cachectic and fatigued Neurologic: intact Heart: irregularly irregular rhythm Lungs: diminished breath sounds bibasilar Abdomen: Abdomen mildly distended with intact G-tube in the left upper abdomen Extremities: extremities normal, atraumatic, no cyanosis or edema and Homans sign is negative, no sign of DVT Wound: There appears to be no infection around the I do not appreciate cervical or supraclavicular adenopathy PEG tubehas been removed   Diagnostic Studies & Laboratory data:     Recent Radiology Findings:  Dg Chest 2 View  03/31/2015   CLINICAL DATA:  69 year old female with a history of cough  EXAM: CHEST - 2 VIEW  COMPARISON:  PET-CT 03/24/2015, plain film 03/08/2015, chest CT 03/07/2015  FINDINGS: Cardiomediastinal silhouette is unchanged in size and contour.  No pulmonary vascular congestion.  Low lung volumes. No confluent airspace disease or pneumothorax. Similar appearance of interstitial opacities compared to prior plain films. No pleural effusion.  No displaced fracture. Multilevel degenerative changes of the thoracic spine.  Bilateral  cervical ribs.  Unremarkable appearance of the upper abdomen.  IMPRESSION: No radiographic evidence of acute cardiopulmonary disease.  Signed,  Dulcy Fanny. Earleen Newport, DO  [facility]  [facility]   Electronically Signed   By: Corrie Mckusick D.O.   On: 03/31/2015 08:25   Nm Pet Image Restag (ps) Skull Base To Thigh  03/24/2015   CLINICAL DATA:  Subsequent treatment strategy for esophageal cancer.  EXAM: NUCLEAR MEDICINE PET SKULL BASE TO THIGH  TECHNIQUE: 7.4 mCi F-18 FDG was injected intravenously. Full-ring PET imaging was performed from the skull base to thigh after the radiotracer. CT data was obtained and used for attenuation correction and anatomic localization.  FASTING BLOOD GLUCOSE:  Value: 219 mg/dl  COMPARISON:  CTs of the neck, abdomen and pelvis 03/07/2015. Chest CT 11/29/2014.  FINDINGS: NECK  No hypermetabolic cervical lymph nodes are identified.There are no lesions of the pharyngeal mucosal space. There is low-level activity associated with the muscles of phonation, within physiologic limits.  CHEST  There are no hypermetabolic mediastinal, hilar or axillary lymph nodes. There is focal hypermetabolic activity at the gastroesophageal junction, extending over a short segment. This has an SUV max of 5.5. Underlying wall thickening in this region appears grossly stable without well-defined mass. There is no suspicious pulmonary activity or suspicious pulmonary nodule. Mild emphysema, basilar interstitial prominence and small pleural effusions are noted.  ABDOMEN/PELVIS  There is no hypermetabolic activity within the liver, adrenal glands, spleen or pancreas. There is no hypermetabolic nodal activity. Pancreatic atrophy and aortoiliac atherosclerosis noted.  SKELETON  There is no hypermetabolic activity to suggest osseous metastatic disease. There are postsurgical changes within the lumbar spine.  IMPRESSION: 1. Nonspecific short segment hypermetabolic activity at  the gastroesophageal junction may be related to treated tumor and/or radiation change. 2. No evidence of metastatic disease. There is no abnormal activity within the liver or adjacent lymph nodes.   Electronically Signed   By: Richardean Sale M.D.   On: 03/24/2015 10:20  I have independently reviewed the above radiology studies  and reviewed the findings with the patient.  Final Report  03/07/2015  CLINICAL DATA: Esophageal cancer. Chemotherapy and radiation therapy completed August  2015.  EXAM: CT ABDOMEN AND PELVIS WITH CONTRAST  TECHNIQUE: Multidetector CT imaging of the abdomen and pelvis was performed using the standard protocol following bolus administration of intravenous contrast.  CONTRAST: 100 mL Isovue  COMPARISON: PET-CT scan 04/21/2014, CT scan 11/16/2014  FINDINGS: Lower chest: Lung bases are clear.  Hepatobiliary: No focal hepatic lesion. The gallbladder is collapsed.  Pancreas: Pancreas is normal. No ductal dilatation. No pancreatic inflammation.  Spleen: Normal spleen  Adrenals/urinary tract: Mild nodularity of adrenal glands is unchanged. Kidneys are normal. The ureters and bladder normal.  Stomach/Bowel: There is thickening of the distal esophagus similar to prior. Stomach, small bowel, and colon are unremarkable.  Vascular/Lymphatic: Atherosclerotic calcification aorta. No periportal retroperitoneal lymphadenopathy. No gastrohepatic ligament lymphadenopathy. No pelvic adenopathy or inguinal adenopathy.  Reproductive: Post hysterectomy.  Musculoskeletal: No aggressive osseous lesion. Posterior lumbar fusion  Other: No peritoneal metastasis.  IMPRESSION: 1. No evidence of esophageal cancer recurrence or metastasis in the abdomen pelvis. 2. Stable thickening distal esophagus likely relates radiation change. 3. Atherosclerotic calcification of the aorta.   Electronically Signed By: Suzy Bouchard M.D. On: 03/07/2015 16:43    CLINICAL  DATA: Shortness of breath; history of esophageal malignancy, PEG tube placement, appendectomy, hysterectomy, and lumbar fusion.  EXAM: CT ANGIOGRAPHY CHEST  CT ABDOMEN AND PELVIS WITH CONTRAST  TECHNIQUE: Multidetector CT imaging of the chest was performed using the standard protocol during bolus administration of intravenous contrast. Multiplanar CT image reconstructions and MIPs were obtained to evaluate the vascular anatomy. Multidetector CT imaging of the abdomen and pelvis was performed using the standard protocol during bolus administration of intravenous contrast.  CONTRAST: 100 cc of Isovue 370 intravenously. The patient also received oral contrast material through the PEG tube.  COMPARISON: Portable chest x-ray of today's date and PET-CT study of April 21, 2014 and CT scan of the chest and abdomen abdomen dated March 31, 2014  FINDINGS: CTA CHEST FINDINGS  There are small filling defects within peripheral pulmonary arterial branches to the right lower lobe. There are no filling defects elsewhere on the right nor on the left. The caliber of the thoracic aorta is normal. The cardiac chambers are normal in size. The RV LV ratio is approximately 1. There is no pericardial effusion. There is no lymphadenopathy. There is thickening of the wall of the lower 1/3 of the esophagus without visible discrete mass. There is no free mediastinal fluid or air.  At lung window settings there is no pulmonary parenchymal mass. There is increased density in the posterior medial aspect of the right costophrenic gutter which may reflect pneumonia or pulmonary infarction new since the previous studies. There is no pleural effusion nor pulmonary parenchymal mass.  There are degenerative changes of the lower thoracic discs. There is no compression fracture. The sternum and observed ribs exhibit no acute abnormalities.  CT ABDOMEN and PELVIS FINDINGS  The liver, gallbladder, pancreas,  spleen, right adrenal gland, and kidneys are normal. There is stable mild enlargement of the left adrenal gland. There is no bulky periaortic or pericaval lymphadenopathy. The abdominal aorta exhibits mural thrombus and calcified plaque, but no evidence of aneurysm. The stomach contains a PEG tube and is of partially distended with gas. The GE junction region remains prominent. No perigastric lymphadenopathy is demonstrated. The small bowel is normal. There is a moderate stool burden within the colon without evidence of obstruction. The rectum is mildly distended with gas and stool. The urinary bladder is unremarkable. The uterus is surgically absent. The patient  has undergone previous posterior fusion at L3-4 and L4-5. The vertebral bodies are preserved in height. The bony pelvis is unremarkable.  Review of the MIP images confirms the above findings.  IMPRESSION: 1. There are small emboli within branches of the right lower lobe pulmonary artery posteriorly without evidence of significant right heart strain. There is small amount of adjacent presumed infarct versus pneumonia. 2. There is mild thickening of the wall of the distal third of the esophagus without evidence of a discrete mass or perforation. 3. There is no evidence of CHF nor pulmonary parenchymal masses. There is no pleural effusion. 4. No acute intra-abdominal abnormality is demonstrated. There are atherosclerotic changes of the abdominal aorta without evidence of dissection or aneurysm. There is stable mild enlargement of the left adrenal gland. 5. These results were called by telephone at the time of interpretation on 07/21/2014 at 12:59 pm to Dr. Isla Pence MD, who verbally acknowledged these results.   Electronically Signed By: David Martinique On: 07/21/2014 12:59   CLINICAL DATA: Initial treatment strategy for esophageal carcinoma.  EXAM: NUCLEAR MEDICINE PET SKULL BASE TO THIGH  TECHNIQUE: 11.0 mCi F-18  FDG was injected intravenously. Full-ring PET imaging was performed from the skull base to thigh after the radiotracer. CT data was obtained and used for attenuation correction and anatomic localization.  FASTING BLOOD GLUCOSE: Value: 73 mg/dl  COMPARISON: CT 03/31/2014  FINDINGS: NECK  No hypermetabolic lymph nodes in the neck.  CHEST  There is a long segment of intense hypermetabolic activity associated with the esophagus. This extends from relate just superior to the carina through the GE junction with SUV max equal 8.5. There is a hypermetabolic mass at the esophageal junction extending to the gastric cardia with SUV max equals 17.3. No hypermetabolic mediastinal lymph nodes. No suspicious pulmonary nodules.  ABDOMEN/PELVIS  Hypermetabolic mass in the gastric cardia described above. There is no hypermetabolic gastro hepatic ligament lymph nodes. No abnormal metabolic activity within the liver.  No hypermetabolic abdominal pelvic lymph nodes  SKELETON  No focal hypermetabolic activity to suggest skeletal metastasis.  IMPRESSION: 1. Hypermetabolic mass in the gastric cardia and esophageal junction consists with primary esophageal / gastric carcinoma. 2. Long segment intense metabolic activity associated with the mid and distal esophagus. Differential includes esophageal carcinoma versus esophagitis. 3. No evidence of hypermetabolic mediastinal or gastrohepatic ligament lymph nodes. 4. No evidence of liver metastasis. 5. No distant metastasis   Electronically Signed By: Suzy Bouchard M.D. On: 04/21/2014 13:57    CLINICAL DATA: Difficulty swallowing and epigastric pain. Esophageal disorder. Prior appendectomy and hysterectomy.  EXAM: CT CHEST AND ABDOMEN WITH CONTRAST  TECHNIQUE: Multidetector CT imaging of the chest and abdomen was performed following the standard protocol during bolus administration of intravenous contrast.  CONTRAST: 80 cc Isovue  370  COMPARISON: Abdominal pelvic CT 02/10/2014. Chest radiograph 04/07/2013. Chest CT 04/23/2008.  FINDINGS: CT CHEST FINDINGS  Lungs/Pleura: No nodules or airspace opacities. No pleural fluid.  Heart/Mediastinum: No supraclavicular adenopathy. Mildly age advanced aortic and branch vessel atherosclerosis. Normal heart size with lipomatous hypertrophy of the interatrial septum. Multivessel coronary artery atherosclerosis. No mediastinal or hilar adenopathy. Air contrast level in the thoracic esophagus on image 28 of series 2 and more superiorly on image 19/series 2.  CT ABDOMEN FINDINGS  Abdomen: Normal liver, spleen. Redemonstration of soft tissue fullness involving the gastroesophageal junction and proximal stomach. Example images 37-42 of series 2. Suspect fluid/gastric contents with soft tissue density in the gastric cardia/body junction on image 39/series  2.  Normal pancreas, gallbladder, biliary tract. Mild right adrenal thickening and left adrenal nodularity are grossly similar back to 2009. Suspect an underlying left adrenal adenoma at 1.3 cm. 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 Normal right kidney. Advanced aortic and branch vessel atherosclerosis. Ulcerative plaque within the infrarenal aorta, including on image 62/series 2. Beam hardening artifact from spinal hardware. No retroperitoneal or retrocrural adenopathy. Normal colon and terminal ileum. Normal abdominal small bowel without ascites. No evidence of omental or peritoneal disease.  Bones/Musculoskeletal: L3-5 lumbar spine fixation. Lower thoracic degenerative disc disease.  IMPRESSION: CT CHEST IMPRESSION  1. No acute process in the chest. 2. Age advanced coronary artery atherosclerosis. Recommend assessment of coronary risk factors and consideration of medical therapy. 3. Mildly dilated thoracic esophagus with contrast within. This suggests a component of esophageal obstruction,  dysmotility, or gastroesophageal reflux disease.  CT ABDOMEN AND PELVIS IMPRESSION  1. Redemonstration of soft tissue fullness at the distal esophagus and proximal stomach. Cannot exclude gastritis or gastroesophageal carcinoma. If not already performed, endoscopy is recommended. 2. Advanced abdominal aortic and branch vessel atherosclerosis.   Electronically Signed By: Abigail Miyamoto M.D. On: 03/31/2014 14:14   Pretreatment EUS: Endoscopic findings: 1. Malignant mass in distal esophagus. The mass was circumferential, patially obstructing but I was able to fairly easily advance echoendoscope through the strictured lumen. The proximal edge was 34 cm from the incisors and distal edge (just below the GE junction) was at 39 cm. The mass is 5cm long. EUS findings: 1. The mass above corresponded with a hypoechoic, heterogeneous mass that clearly passes into and through the muscularis propria layer of the esophageal wall (uT3). 2. There were two round, well demarcated, 5-50mm, hypoechoic paraesophageal lymphnodes that lay directly adjacent to the mass that are suspicious for malignant involvement (uN1). 3. No celiac adenopathy. Impression: uT3N1 (clinical stage IIIa) 5cm long, cirumferential GE junction adenocarcinoma with proximal edge at 34cm from incisors and distal edge just below the GE junction.   Post Treatment EUS: Endoscopic findings: 1. The previously noted (2015) GE junction malignancy was smaller now, but still clearly present. It was 2-3cm long, non-circumferential, ulcerated, located at GE junction (37cm from incisors). EUS findings: 1. The mass above correlated with a hypoechoic, heterogeneous lesion that clearly passed into and through the muscularis propria layer of the distal esophagus, GE junction wall (uT3). 2. The was no paraesophageal, mediastinal, celiac adenopathy (uN0). ENDOSCOPIC IMPRESSION: uT3N0 (stage IIa) non-circumferential, 2-3cm long, ulcerated GE  junction adenocarcinoma. This appears to have responded to neoadjuvant chemo (was previously staged IIIa) and so she may be a candidate for surgical resection   Recent Lab Findings: Lab Results  Component Value Date   WBC 10.7* 03/30/2015      Assessment / Plan:   At this point with recurrent local disease I have been considering surgical resection vs continued chemotherpy. Appears  to be downstage to clinical stage IIA.  Plan to see her back in approximate 2 weeks after cardioversion and follow up with cardiology. Her  cardiac status my preclude resection in other risk high risk patient.  I  spent 20  minutes counseling the patient face to face and 50% or more the  time was spent in counseling and coordination of care. The total time spent in the appointment was 35 minutes.  Grace Isaac MD      [address] [place] [address] Office [phone]   Beeper [phone]  04/04/2015 3:40 PM

## 2015-04-08 ENCOUNTER — Ambulatory Visit (HOSPITAL_COMMUNITY)
Admission: RE | Admit: 2015-04-08 | Discharge: 2015-04-08 | Disposition: A | Discharge status: Home / Self Care | Payer: Commercial Managed Care - PPO | Source: Ambulatory Visit | Attending: Cardiovascular Disease | Admitting: Cardiovascular Disease

## 2015-04-08 ENCOUNTER — Encounter (HOSPITAL_COMMUNITY)
Admission: RE | Disposition: A | Discharge status: Home / Self Care | Payer: Self-pay | Source: Ambulatory Visit | Attending: Cardiovascular Disease

## 2015-04-08 ENCOUNTER — Encounter (HOSPITAL_COMMUNITY): Payer: Self-pay | Admitting: Anesthesiology

## 2015-04-08 DIAGNOSIS — I4891 Unspecified atrial fibrillation: Secondary | ICD-10-CM | POA: Diagnosis not present

## 2015-04-08 DIAGNOSIS — Z538 Procedure and treatment not carried out for other reasons: Secondary | ICD-10-CM | POA: Insufficient documentation

## 2015-04-08 SURGERY — CANCELLED PROCEDURE

## 2015-04-18 ENCOUNTER — Ambulatory Visit: Payer: Commercial Managed Care - PPO | Admitting: Cardiothoracic Surgery

## 2015-04-28 ENCOUNTER — Ambulatory Visit (INDEPENDENT_AMBULATORY_CARE_PROVIDER_SITE_OTHER): Payer: Commercial Managed Care - PPO | Admitting: Cardiothoracic Surgery

## 2015-04-28 ENCOUNTER — Encounter: Payer: Self-pay | Admitting: Cardiothoracic Surgery

## 2015-04-28 VITALS — BP 146/75 | HR 63 | Resp 16 | Ht 61.0 in | Wt 156.0 lb

## 2015-04-28 DIAGNOSIS — Z5189 Encounter for other specified aftercare: Secondary | ICD-10-CM | POA: Diagnosis not present

## 2015-04-28 DIAGNOSIS — C159 Malignant neoplasm of esophagus, unspecified: Secondary | ICD-10-CM

## 2015-04-28 DIAGNOSIS — Z9221 Personal history of antineoplastic chemotherapy: Secondary | ICD-10-CM

## 2015-04-28 DIAGNOSIS — Z923 Personal history of irradiation: Secondary | ICD-10-CM

## 2015-04-28 NOTE — Patient Instructions (Signed)
Esophageal Cancer Esophageal cancer occurs when abnormal cells within the esophagus begin to divide rapidly and uncontrollably. The esophagus is the tube that carries food and drink from the throat into the stomach.  CAUSES  The exact cause of esophageal cancer is not known. It is believed that esophageal cancer occurs due to many factors. People are at a higher risk of developing esophageal cancer if they:  Are older than 69 years of age.  Are female.  Smoke or use tobacco.  Drink heavily.  Eat a poor diet (low in fruits and vegetables).  Are overweight (obese).  Have conditions which result in long-standing damage or irritation to the esophagus. These conditions include:  Acid reflux (the stomach acid leaks back up the esophagus, damaging it).  Barrett Esophagus (abnormal cells are found in the lower part of the esophagus, usually due to acid reflux occurring over a long period of time).  Achalasia (the muscles and nerves of the esophagus do not work properly. Food and drink do not move in a normal way down the esophagus and into the stomach).  Esophageal webs (thin strings of tissues grow within the esophagus).  Damage due to toxic exposures (an example would be swallowing a caustic poison such as lye). SYMPTOMS  Symptoms can include:  Trouble swallowing.  Chest or back pain.  Unintentional weight loss.  Severe tiredness (fatigue).  Hoarse voice.  Cough. DIAGNOSIS  Esophageal cancer is usually diagnosed by performing:  Barium swallow. After drinking barium (a liquid that coats the esophagus), a series of X-rays are taken which can reveal abnormalities.  Endoscopic exam. A lighted scope is used to view the inside of the esophagus.  Biopsy. Small pieces of the esophagus are removed for exam in a lab. This can be done through the scope used for an endoscopic exam. TREATMENT  Treatment of esophageal cancer depends on a number of factors, including:   Characteristics  of the cancer cells present.  Tumor size.  Whether the cancer has spread to lymph nodes or to other more distant locations (such as other organs or bone). The types of treatments used for esophageal cancer include:  Surgery to remove as much of the cancer as possible.  Chemotherapy (medicines that kill cancer cells).  Radiation therapy to kill cancer cells.  Combinations of chemotherapy and radiation therapy.  Biological therapy that uses antibodies in ways that can take advantage of the weaknesses of the tumor cells. Your caregivers will also address your needs for pain relief, nutrition, and help with swallowing problems if these develop.  HOME CARE INSTRUCTIONS   Only take over-the-counter or prescription medicines for pain, discomfort or fever as directed by your caregiver.  Maintain a healthy diet. Advice from a nutritionist can be helpful when addressing your specific needs.  Consider joining a support group. This may help you learn to cope with the stress of having esophageal cancer.  Seek advice to help you manage treatment side effects. SEEK MEDICAL CARE IF:  You develop problems with swallowing that are getting worse.  You notice new fatigue or weakness.  You experience unintentional weight loss. SEEK IMMEDIATE MEDICAL CARE IF:  You have a sudden increase in pain.  You have trouble breathing.  You have a fever.  You vomit blood or black material that looks like coffee grounds.  You faint. Document Released: 09/27/2008 Document Revised: 01/07/2012 Document Reviewed: 09/27/2008 [facility] Patient Information 2015 [place], [place]. This information is not intended to replace advice given to you by your health  care provider. Make sure you discuss any questions you have with your health care provider.

## 2015-04-29 NOTE — Progress Notes (Signed)
[address]       [place] [address]             [place] Record #[id number] Date of Birth: Mar 21, 1946  Referring: Marice Potter, MD Primary Care: Lillard Anes, MD  Chief Complaint:    Chief Complaint  Patient presents with  . Follow-up    2 wk ....cardioversion cancelled.Marland Kitchenable to convert with medication    History of Present Illness:    Sharon Price 69 y.o. 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 The patient has completed neoadjuvant chemotherapy radiation with weekly carboplatin and and paclltaxel in AUG 2015. Her treatment course has been complicated with severe dehydration poor nutrition, requiring a PEG tube to be placed which  Became  infected and had to be replaced several times. The patient needed supplementary IV fluids through a PICC line.   She is markedly improved . Her weight has increased from 130-to 151, she's taking a by mouth diet without difficulty. She is still somoking "some".   Patient is noted on  CT scan 07/21/2014 to have evidence of pulmonary emboli, she is now on  xarelto  Repeat endoscopy in [place] is reported to confirm recurrent or persistent carcinoma in the distal esophagus on biopsy. Repeat endoscopy 03/31/2015 [id number] GE mass bx infiltrating moderate to poorly differentiated  Adenocarcinoma.   Patient has improved from when first seen and is being considered for resection, Repeat EUS and PET done.  I sent her for cardiology clearance. Dr Frances Nickels saw her , she complained her heart rate was fast and she was found to be in atrial flutter. She was scheduled for cardioversion, when she came for cardioversion she was in sinus rhyth and cardioversion was not done. She has not been back to cardiology after for clearance.     Current Activity/ Functional  Status:  Patient is independent with mobility/ambulation, transfers, ADL's, IADL's.   Zubrod Score: At the time of surgery this patient's most appropriate activity status/level should be described as: []     0    Normal activity, no symptoms [x]     1    Restricted in physical strenuous activity but ambulatory, able to do out light work []     2    Ambulatory and capable of self care, unable to do work activities, up and about               >50 % of waking hours                              []     3    Only limited self care, in bed greater than 50% of waking hours []     4    Completely disabled, no self care, confined to bed or chair []     5    Moribund   Past Medical History  Diagnosis Date  . Diabetes mellitus without complication   . Chronic kidney disease     renal..STAGE 2  . Hypertension   . Mechanical dysphagia     MOSTLY SOLIDS  . Pulmonary emboli 9/223/15 [place]    RIGHT LOWER LOBE PER CT CHEST   . Patient  on combined chemotherapy and radiation     [facility]  . Cancer 03/30/14    GE JUNCTION  . Transfusion history     ?[facility].    Past Surgical History  Procedure Laterality Date  . Back surgery    . Abdominal hysterectomy    . Tubal ligation    . Eus N/A 04/08/2014    Procedure: ESOPHAGEAL ENDOSCOPIC ULTRASOUND (EUS) RADIAL;  Surgeon: Milus Banister, MD;  Location: [facility] ENDOSCOPY;  Service: Endoscopy;  Laterality: N/A;  . Appendectomy    . Spine surgery    . Lumbar fusion    . Esophagogastroduodenoscopy (egd) with propofol N/A 12/02/2014    Procedure: ESOPHAGOGASTRODUODENOSCOPY (EGD) WITH PROPOFOL;  Surgeon: Jerene Bears, MD;  Location: [facility] ENDOSCOPY;  Service: Endoscopy;  Laterality: N/A;  . Eus N/A 03/31/2015    Procedure: UPPER ENDOSCOPIC ULTRASOUND (EUS) RADIAL;  Surgeon: Milus Banister, MD;  Location: [facility] ENDOSCOPY;  Service: Endoscopy;  Laterality: N/A;    Family History  Problem Relation Age of  Onset  . Diabetes Mother   . Hypertension Mother   . Stroke Mother   . Heart disease Mother   . Hypertension Father   . Diabetes Sister   . Cancer Sister     THYROID  . Cancer Brother     LUNG  . Diabetes Daughter   . Diabetes Son     History   Social History  . Marital Status: Widowed    Spouse Name: N/A  . Number of Children: N/A  . Years of Education: N/A   Occupational History  . Not on file.   Social History Main Topics  . Smoking status: Former Smoker -- 1.00 packs/day for 20 years    Quit date: 03/02/2015  . Smokeless tobacco: Not on file     Comment: 3 weeks- < 1ppd.  . Alcohol Use: No  . Drug Use: No  . Sexual Activity: Not on file   Other Topics Concern  . Not on file   Social History Narrative    History  Smoking status  . Former Smoker -- 1.00 packs/day for 20 years  . Quit date: 03/02/2015  Smokeless tobacco  . Not on file    Comment: 3 weeks- < 1ppd.    History  Alcohol Use No     No Known Allergies  Current Outpatient Prescriptions  Medication Sig Dispense Refill  . acetaminophen (TYLENOL) 500 MG tablet Take 1,000 mg by mouth every 6 (six) hours as needed for moderate pain or headache.    . Albuterol Sulfate (PROAIR RESPICLICK IN) Inhale 1 puff into the lungs as needed (for coughing/ shortness of breath).     . ergocalciferol (VITAMIN D2) 50000 UNITS capsule Take 50,000 Units by mouth every 30 (thirty) days. Takes around the 1st or 2nd of month.    . gabapentin (NEURONTIN) 300 MG capsule Take 300 mg by mouth 4 (four) times daily.    Marland Kitchen HYDROcodone-acetaminophen (NORCO/VICODIN) 5-325 MG per tablet Take 1 tablet by mouth every 4 (four) hours as needed for moderate pain. 65 tablet 0  . insulin glargine (LANTUS) 100 UNIT/ML injection Inject 0.1 mLs (10 Units total) into the skin at bedtime. (Patient taking differently: Inject 20 Units into the skin at bedtime. ) 10 mL 5  . lisinopril (PRINIVIL,ZESTRIL) 10 MG tablet Take 1 tablet (10 mg total)  by mouth daily. 90 tablet 3  . LORazepam (ATIVAN) 0.5 MG tablet Take 1 tablet (0.5  mg total) by mouth every 8 (eight) hours as needed for anxiety. 30 tablet 0  . metFORMIN (GLUCOPHAGE) 500 MG tablet Take 1 tablet (500 mg total) by mouth 2 (two) times daily with a meal.    . metoprolol tartrate (LOPRESSOR) 25 MG tablet Take 1 tablet (25 mg total) by mouth 2 (two) times daily. 60 tablet 11  . Nepafenac (ILEVRO OP) Place 1 drop into the right eye at bedtime.    . pantoprazole (PROTONIX) 40 MG tablet Take 1 tablet (40 mg total) by mouth 2 (two) times daily before a meal. 60 tablet 1  . rivaroxaban (XARELTO) 20 MG TABS tablet Take 20 mg by mouth daily with supper.    . sennosides (SENOKOT) 8.8 MG/5ML syrup Take 10 mLs by mouth 2 (two) times daily. 240 mL 5  . simvastatin (ZOCOR) 40 MG tablet Take 40 mg by mouth daily.    . sucralfate (CARAFATE) 1 GM/10ML suspension Take 10 mLs (1 g total) by mouth 4 (four) times daily -  before meals and at bedtime. 420 mL 5   No current facility-administered medications for this visit.     Review of Systems:     Cardiac Review of Systems: Y or N  Chest Pain [  y  ]  Resting SOB [ n  ] Exertional SOB  [ y ]  Orthopnea [  ]   Pedal Edema [   ]    Palpitations [  ] Syncope  [  ]   Presyncope [   ]  General Review of Systems: [Y] = yes [  ]=no Constitional: recent weight change [ gained wt ];  Wt loss over the last 3 months [ ]  anorexia [  ]; fatigue [ y ]; nausea [ y ]; night sweats [  ]; fever [  ]; or chills [  ];          Dental: poor dentition[ n ]; Last Dentist visit:   Eye : blurred vision [  ]; diplopia [   ]; vision changes [  ];  Amaurosis fugax[  ]; Resp: cough [  ];  wheezing[  ];  hemoptysis[ n ]; shortness of breath[ n ]; paroxysmal nocturnal dyspnea[  ]; dyspnea on exertion[ y ]; or orthopnea[  ];  GI:  gallstones[  ], vomiting[n  ];  dysphagia[y  ]; melena[  ];  hematochezia [ y ]; heartburn[ y ];   Hx of  Colonoscopy[  ]; GU: kidney stones [  ];  hematuria[  ];   dysuria [  ];  nocturia[  ];  history of     obstruction [  ]; urinary frequency [  ]             Skin: rash, swelling[  ];, hair loss[  ];  peripheral edema[  ];  or itching[  ]; Musculosketetal: myalgias[  ];  joint swelling[  ];  joint erythema[  ];  joint pain[  ];  back pain[  ];  Heme/Lymph: bruising[  ];  bleeding[n  ];  anemia[  ];  Neuro: TIA[  ];  headaches[  ];  stroke[  ];  vertigo[  ];  seizures[  ];   paresthesias[  ];  difficulty walking[ n ];  Psych:depression[  ]; anxiety[ y ];  Endocrine: diabetes[  ];  thyroid dysfunction[  ];  Immunizations: Flu up to date [ n ]; Pneumococcal up to date Fl[facility]  ];  Other:  Physical Exam: BP 146/75 mmHg  Pulse 63  Resp 16  Ht 5\' 1"  (1.549 m)  Wt 156 lb (70.761 kg)  BMI 29.49 kg/m2  SpO2 96%   Wt Readings from Last 3 Encounters:  04/28/15 156 lb (70.761 kg)  04/04/15 156 lb (70.761 kg)  03/31/15 151 lb (68.493 kg)   weight up from 132  PHYSICAL EXAMINATION: BP 146/75 mmHg  Pulse 63  Resp 16  Ht 5\' 1"  (1.549 m)  Wt 156 lb (70.761 kg)  BMI 29.49 kg/m2  SpO2 96%   General appearance: alert, cooperative, appears older than stated age, cachectic and fatigued Neurologic: intact Heart: Appears to be in a regular rhythm today Lungs: diminished breath sounds bibasilar Abdomen: Abdomen mildly distended with intact G-tube in the left upper abdomen Extremities: extremities normal, atraumatic, no cyanosis or edema and Homans sign is negative, no sign of DVT Wound: There appears to be no infection around the I do not appreciate cervical or supraclavicular adenopathy PEG tubehas been removed   Diagnostic Studies & Laboratory data:     Recent Radiology Findings:  Dg Chest 2 View  03/31/2015   CLINICAL DATA:  68 year old female with a history of cough  EXAM: CHEST - 2 VIEW  COMPARISON:  PET-CT 03/24/2015, plain film 03/08/2015, chest CT 03/07/2015  FINDINGS: Cardiomediastinal silhouette is unchanged in size and contour.   No pulmonary vascular congestion.  Low lung volumes. No confluent airspace disease or pneumothorax. Similar appearance of interstitial opacities compared to prior plain films. No pleural effusion.  No displaced fracture. Multilevel degenerative changes of the thoracic spine.  Bilateral cervical ribs.  Unremarkable appearance of the upper abdomen.  IMPRESSION: No radiographic evidence of acute cardiopulmonary disease.  Signed,  Dulcy Fanny. Earleen Newport, DO  [facility]  [facility]   Electronically Signed   By: Corrie Mckusick D.O.   On: 03/31/2015 08:25   Nm Pet Image Restag (ps) Skull Base To Thigh  03/24/2015   CLINICAL DATA:  Subsequent treatment strategy for esophageal cancer.  EXAM: NUCLEAR MEDICINE PET SKULL BASE TO THIGH  TECHNIQUE: 7.4 mCi F-18 FDG was injected intravenously. Full-ring PET imaging was performed from the skull base to thigh after the radiotracer. CT data was obtained and used for attenuation correction and anatomic localization.  FASTING BLOOD GLUCOSE:  Value: 219 mg/dl  COMPARISON:  CTs of the neck, abdomen and pelvis 03/07/2015. Chest CT 11/29/2014.  FINDINGS: NECK  No hypermetabolic cervical lymph nodes are identified.There are no lesions of the pharyngeal mucosal space. There is low-level activity associated with the muscles of phonation, within physiologic limits.  CHEST  There are no hypermetabolic mediastinal, hilar or axillary lymph nodes. There is focal hypermetabolic activity at the gastroesophageal junction, extending over a short segment. This has an SUV max of 5.5. Underlying wall thickening in this region appears grossly stable without well-defined mass. There is no suspicious pulmonary activity or suspicious pulmonary nodule. Mild emphysema, basilar interstitial prominence and small pleural effusions are noted.  ABDOMEN/PELVIS  There is no hypermetabolic activity within the liver, adrenal glands, spleen or pancreas. There is no  hypermetabolic nodal activity. Pancreatic atrophy and aortoiliac atherosclerosis noted.  SKELETON  There is no hypermetabolic activity to suggest osseous metastatic disease. There are postsurgical changes within the lumbar spine.  IMPRESSION: 1. Nonspecific short segment hypermetabolic activity at the gastroesophageal junction may be related to treated tumor and/or radiation change. 2. No evidence of metastatic disease. There is no abnormal activity within the liver or adjacent lymph nodes.   Electronically  Signed   By: Richardean Sale M.D.   On: 03/24/2015 10:20  I have independently reviewed the above radiology studies  and reviewed the findings with the patient.  Final Report  03/07/2015  CLINICAL DATA: Esophageal cancer. Chemotherapy and radiation therapy completed August 2015.  EXAM: CT ABDOMEN AND PELVIS WITH CONTRAST  TECHNIQUE: Multidetector CT imaging of the abdomen and pelvis was performed using the standard protocol following bolus administration of intravenous contrast.  CONTRAST: 100 mL Isovue  COMPARISON: PET-CT scan 04/21/2014, CT scan 11/16/2014  FINDINGS: Lower chest: Lung bases are clear.  Hepatobiliary: No focal hepatic lesion. The gallbladder is collapsed.  Pancreas: Pancreas is normal. No ductal dilatation. No pancreatic inflammation.  Spleen: Normal spleen  Adrenals/urinary tract: Mild nodularity of adrenal glands is unchanged. Kidneys are normal. The ureters and bladder normal.  Stomach/Bowel: There is thickening of the distal esophagus similar to prior. Stomach, small bowel, and colon are unremarkable.  Vascular/Lymphatic: Atherosclerotic calcification aorta. No periportal retroperitoneal lymphadenopathy. No gastrohepatic ligament lymphadenopathy. No pelvic adenopathy or inguinal adenopathy.  Reproductive: Post hysterectomy.  Musculoskeletal: No aggressive osseous lesion. Posterior lumbar fusion  Other: No peritoneal metastasis.  IMPRESSION: 1.  No evidence of esophageal cancer recurrence or metastasis in the abdomen pelvis. 2. Stable thickening distal esophagus likely relates radiation change. 3. Atherosclerotic calcification of the aorta.   Electronically Signed By: Suzy Bouchard M.D. On: 03/07/2015 16:43    CLINICAL DATA: Shortness of breath; history of esophageal malignancy, PEG tube placement, appendectomy, hysterectomy, and lumbar fusion.  EXAM: CT ANGIOGRAPHY CHEST  CT ABDOMEN AND PELVIS WITH CONTRAST  TECHNIQUE: Multidetector CT imaging of the chest was performed using the standard protocol during bolus administration of intravenous contrast. Multiplanar CT image reconstructions and MIPs were obtained to evaluate the vascular anatomy. Multidetector CT imaging of the abdomen and pelvis was performed using the standard protocol during bolus administration of intravenous contrast.  CONTRAST: 100 cc of Isovue 370 intravenously. The patient also received oral contrast material through the PEG tube.  COMPARISON: Portable chest x-ray of today's date and PET-CT study of April 21, 2014 and CT scan of the chest and abdomen abdomen dated March 31, 2014  FINDINGS: CTA CHEST FINDINGS  There are small filling defects within peripheral pulmonary arterial branches to the right lower lobe. There are no filling defects elsewhere on the right nor on the left. The caliber of the thoracic aorta is normal. The cardiac chambers are normal in size. The RV LV ratio is approximately 1. There is no pericardial effusion. There is no lymphadenopathy. There is thickening of the wall of the lower 1/3 of the esophagus without visible discrete mass. There is no free mediastinal fluid or air.  At lung window settings there is no pulmonary parenchymal mass. There is increased density in the posterior medial aspect of the right costophrenic gutter which may reflect pneumonia or pulmonary infarction new since the previous studies.  There is no pleural effusion nor pulmonary parenchymal mass.  There are degenerative changes of the lower thoracic discs. There is no compression fracture. The sternum and observed ribs exhibit no acute abnormalities.  CT ABDOMEN and PELVIS FINDINGS  The liver, gallbladder, pancreas, spleen, right adrenal gland, and kidneys are normal. There is stable mild enlargement of the left adrenal gland. There is no bulky periaortic or pericaval lymphadenopathy. The abdominal aorta exhibits mural thrombus and calcified plaque, but no evidence of aneurysm. The stomach contains a PEG tube and is of partially distended with gas. The GE junction region  remains prominent. No perigastric lymphadenopathy is demonstrated. The small bowel is normal. There is a moderate stool burden within the colon without evidence of obstruction. The rectum is mildly distended with gas and stool. The urinary bladder is unremarkable. The uterus is surgically absent. The patient has undergone previous posterior fusion at L3-4 and L4-5. The vertebral bodies are preserved in height. The bony pelvis is unremarkable.  Review of the MIP images confirms the above findings.  IMPRESSION: 1. There are small emboli within branches of the right lower lobe pulmonary artery posteriorly without evidence of significant right heart strain. There is small amount of adjacent presumed infarct versus pneumonia. 2. There is mild thickening of the wall of the distal third of the esophagus without evidence of a discrete mass or perforation. 3. There is no evidence of CHF nor pulmonary parenchymal masses. There is no pleural effusion. 4. No acute intra-abdominal abnormality is demonstrated. There are atherosclerotic changes of the abdominal aorta without evidence of dissection or aneurysm. There is stable mild enlargement of the left adrenal gland. 5. These results were called by telephone at the time of interpretation on 07/21/2014 at  12:59 pm to Dr. Isla Pence MD, who verbally acknowledged these results.   Electronically Signed By: David Martinique On: 07/21/2014 12:59   CLINICAL DATA: Initial treatment strategy for esophageal carcinoma.  EXAM: NUCLEAR MEDICINE PET SKULL BASE TO THIGH  TECHNIQUE: 11.0 mCi F-18 FDG was injected intravenously. Full-ring PET imaging was performed from the skull base to thigh after the radiotracer. CT data was obtained and used for attenuation correction and anatomic localization.  FASTING BLOOD GLUCOSE: Value: 73 mg/dl  COMPARISON: CT 03/31/2014  FINDINGS: NECK  No hypermetabolic lymph nodes in the neck.  CHEST  There is a long segment of intense hypermetabolic activity associated with the esophagus. This extends from relate just superior to the carina through the GE junction with SUV max equal 8.5. There is a hypermetabolic mass at the esophageal junction extending to the gastric cardia with SUV max equals 17.3. No hypermetabolic mediastinal lymph nodes. No suspicious pulmonary nodules.  ABDOMEN/PELVIS  Hypermetabolic mass in the gastric cardia described above. There is no hypermetabolic gastro hepatic ligament lymph nodes. No abnormal metabolic activity within the liver.  No hypermetabolic abdominal pelvic lymph nodes  SKELETON  No focal hypermetabolic activity to suggest skeletal metastasis.  IMPRESSION: 1. Hypermetabolic mass in the gastric cardia and esophageal junction consists with primary esophageal / gastric carcinoma. 2. Long segment intense metabolic activity associated with the mid and distal esophagus. Differential includes esophageal carcinoma versus esophagitis. 3. No evidence of hypermetabolic mediastinal or gastrohepatic ligament lymph nodes. 4. No evidence of liver metastasis. 5. No distant metastasis   Electronically Signed By: Suzy Bouchard M.D. On: 04/21/2014 13:57    CLINICAL DATA: Difficulty swallowing and epigastric  pain. Esophageal disorder. Prior appendectomy and hysterectomy.  EXAM: CT CHEST AND ABDOMEN WITH CONTRAST  TECHNIQUE: Multidetector CT imaging of the chest and abdomen was performed following the standard protocol during bolus administration of intravenous contrast.  CONTRAST: 80 cc Isovue 370  COMPARISON: Abdominal pelvic CT 02/10/2014. Chest radiograph 04/07/2013. Chest CT 04/23/2008.  FINDINGS: CT CHEST FINDINGS  Lungs/Pleura: No nodules or airspace opacities. No pleural fluid.  Heart/Mediastinum: No supraclavicular adenopathy. Mildly age advanced aortic and branch vessel atherosclerosis. Normal heart size with lipomatous hypertrophy of the interatrial septum. Multivessel coronary artery atherosclerosis. No mediastinal or hilar adenopathy. Air contrast level in the thoracic esophagus on image 28 of series 2 and more superiorly  on image 19/series 2.  CT ABDOMEN FINDINGS  Abdomen: Normal liver, spleen. Redemonstration of soft tissue fullness involving the gastroesophageal junction and proximal stomach. Example images 37-42 of series 2. Suspect fluid/gastric contents with soft tissue density in the gastric cardia/body junction on image 39/series 2.  Normal pancreas, gallbladder, biliary tract. Mild right adrenal thickening and left adrenal nodularity are grossly similar back to 2009. Suspect an underlying left adrenal adenoma at 1.3 cm. 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 Normal right kidney. Advanced aortic and branch vessel atherosclerosis. Ulcerative plaque within the infrarenal aorta, including on image 62/series 2. Beam hardening artifact from spinal hardware. No retroperitoneal or retrocrural adenopathy. Normal colon and terminal ileum. Normal abdominal small bowel without ascites. No evidence of omental or peritoneal disease.  Bones/Musculoskeletal: L3-5 lumbar spine fixation. Lower thoracic degenerative disc disease.  IMPRESSION: CT  CHEST IMPRESSION  1. No acute process in the chest. 2. Age advanced coronary artery atherosclerosis. Recommend assessment of coronary risk factors and consideration of medical therapy. 3. Mildly dilated thoracic esophagus with contrast within. This suggests a component of esophageal obstruction, dysmotility, or gastroesophageal reflux disease.  CT ABDOMEN AND PELVIS IMPRESSION  1. Redemonstration of soft tissue fullness at the distal esophagus and proximal stomach. Cannot exclude gastritis or gastroesophageal carcinoma. If not already performed, endoscopy is recommended. 2. Advanced abdominal aortic and branch vessel atherosclerosis.   Electronically Signed By: Abigail Miyamoto M.D. On: 03/31/2014 14:14   Pretreatment EUS: Endoscopic findings: 1. Malignant mass in distal esophagus. The mass was circumferential, patially obstructing but I was able to fairly easily advance echoendoscope through the strictured lumen. The proximal edge was 34 cm from the incisors and distal edge (just below the GE junction) was at 39 cm. The mass is 5cm long. EUS findings: 1. The mass above corresponded with a hypoechoic, heterogeneous mass that clearly passes into and through the muscularis propria layer of the esophageal wall (uT3). 2. There were two round, well demarcated, 5-48mm, hypoechoic paraesophageal lymphnodes that lay directly adjacent to the mass that are suspicious for malignant involvement (uN1). 3. No celiac adenopathy. Impression: uT3N1 (clinical stage IIIa) 5cm long, cirumferential GE junction adenocarcinoma with proximal edge at 34cm from incisors and distal edge just below the GE junction.   Post Treatment EUS: Endoscopic findings: 1. The previously noted (2015) GE junction malignancy was smaller now, but still clearly present. It was 2-3cm long, non-circumferential, ulcerated, located at GE junction (37cm from incisors). EUS findings: 1. The mass above correlated with a  hypoechoic, heterogeneous lesion that clearly passed into and through the muscularis propria layer of the distal esophagus, GE junction wall (uT3). 2. The was no paraesophageal, mediastinal, celiac adenopathy (uN0). ENDOSCOPIC IMPRESSION: uT3N0 (stage IIa) non-circumferential, 2-3cm long, ulcerated GE junction adenocarcinoma. This appears to have responded to neoadjuvant chemo (was previously staged IIIa) and so she may be a candidate for surgical resection   Recent Lab Findings: Lab Results  Component Value Date   WBC 10.7* 03/30/2015      Assessment / Plan:   At this point with recurrent local disease I have recommended to her that we proceed with  surgical resection . Appears  to be downstage to clinical stage IIA pending the completion of her cardiology preop clearance. We will contact the cardiology office to complete the follow-up and clearance possibly proceeding with surgery the week of August 1. I discussed in detail with the patient and her daughter the risks and options involved and she is willing to proceed.  We have specifically talked about the risk of respiratory failure, anastomotic breakdown, recurrent nerve injury, bleeding blood transfusion and death.   I  spent 20  minutes counseling the patient face to face and 50% or more the  time was spent in counseling and coordination of care. The total time spent in the appointment was 35 minutes.  Grace Isaac MD      [address] [place] [address] Office [phone]   Beeper [phone]  04/29/2015 5:07 PM

## 2015-05-03 ENCOUNTER — Other Ambulatory Visit: Payer: Self-pay | Admitting: *Deleted

## 2015-05-03 ENCOUNTER — Telehealth: Payer: Self-pay | Admitting: *Deleted

## 2015-05-03 DIAGNOSIS — C159 Malignant neoplasm of esophagus, unspecified: Secondary | ICD-10-CM

## 2015-05-03 NOTE — Telephone Encounter (Signed)
Patient is scheduled for her surgery with Dr. Servando Snare on Tuesday 7/12.  We were going to send back to cardiology for clearance but per Dr. Johnsie Cancel: "She converted spontaneously. She is on beta blocker echo is normal and she has no history of CAD or chest pain I think she should be ok for surgery without myoview."  She is aware she will not need to see cardiology.  She is aware to hold Xarelto 5 days prior to surgery.  She is also aware to start clear liquids 2 days prior to surgery and to drink a bottle of mag citrate 1 day prior.  All questions were answered and she will call if any concerns come up.

## 2015-05-05 NOTE — Pre-Procedure Instructions (Signed)
Sharon Price  05/05/2015     Your procedure is scheduled on: Tuesday May 10, 2015 at 7:30 AM.  Report to [facility] Admitting at 5:30 A.M.  Call this number if you have problems the morning of surgery: [phone]    Remember:  Do not eat food or drink liquids after midnight.  Take these medicines the morning of surgery with A SIP OF WATER: Acetaminophen (Tylenol) if needed, Proair inhaler if needed, Gabapentin (Neurontin), Hydrocodone ifneeded, Lorazepam (Ativan) if needed, Metoprolol (Lopressor), Pantoprazole (Protonix)   Only take 16 units of Lantus the night before your surgery   Do NOT take any diabetic medications the morning of your surgery (Ex. Metformin/Glucophage)   Please follow your physicians instructions regarding Xarelto   Do not wear jewelry, make-up or nail polish.  Do not wear lotions, powders, or perfumes.  You may NOT wear deodorant.  Do not shave 48 hours prior to surgery.   Do not bring valuables to the hospital.  [facility] is not responsible for any belongings or valuables.  Contacts, dentures or bridgework may not be worn into surgery.  Leave your suitcase in the car.  After surgery it may be brought to your room.  For patients admitted to the hospital, discharge time will be determined by your treatment team.  Patients discharged the day of surgery will not be allowed to drive home.   Name and phone number of your driver:    Special instructions:  Shower using CHG soap the night before and the morning of your surgery  Please read over the following fact sheets that you were given. Pain Booklet, Coughing and Deep Breathing, Blood Transfusion Information, MRSA Information and Surgical Site Infection Prevention

## 2015-05-06 ENCOUNTER — Encounter (HOSPITAL_COMMUNITY): Payer: Self-pay

## 2015-05-06 ENCOUNTER — Other Ambulatory Visit: Payer: Self-pay

## 2015-05-06 ENCOUNTER — Encounter (HOSPITAL_COMMUNITY)
Admission: RE | Admit: 2015-05-06 | Discharge: 2015-05-06 | Disposition: A | Discharge status: Home / Self Care | Payer: Commercial Managed Care - PPO | Source: Ambulatory Visit | Attending: Cardiothoracic Surgery | Admitting: Cardiothoracic Surgery

## 2015-05-06 ENCOUNTER — Ambulatory Visit (HOSPITAL_COMMUNITY)
Admission: RE | Admit: 2015-05-06 | Discharge: 2015-05-06 | Disposition: A | Discharge status: Home / Self Care | Payer: Commercial Managed Care - PPO | Source: Ambulatory Visit | Attending: Cardiothoracic Surgery | Admitting: Cardiothoracic Surgery

## 2015-05-06 VITALS — BP 126/69 | HR 71 | Temp 98.4°F | Resp 18 | Ht 62.0 in | Wt 157.7 lb

## 2015-05-06 DIAGNOSIS — E119 Type 2 diabetes mellitus without complications: Secondary | ICD-10-CM | POA: Diagnosis not present

## 2015-05-06 DIAGNOSIS — Z87891 Personal history of nicotine dependence: Secondary | ICD-10-CM | POA: Insufficient documentation

## 2015-05-06 DIAGNOSIS — Z01812 Encounter for preprocedural laboratory examination: Secondary | ICD-10-CM | POA: Insufficient documentation

## 2015-05-06 DIAGNOSIS — Z0183 Encounter for blood typing: Secondary | ICD-10-CM | POA: Diagnosis not present

## 2015-05-06 DIAGNOSIS — C159 Malignant neoplasm of esophagus, unspecified: Secondary | ICD-10-CM | POA: Insufficient documentation

## 2015-05-06 DIAGNOSIS — I444 Left anterior fascicular block: Secondary | ICD-10-CM | POA: Insufficient documentation

## 2015-05-06 DIAGNOSIS — Z01818 Encounter for other preprocedural examination: Secondary | ICD-10-CM | POA: Insufficient documentation

## 2015-05-06 DIAGNOSIS — I440 Atrioventricular block, first degree: Secondary | ICD-10-CM | POA: Insufficient documentation

## 2015-05-06 DIAGNOSIS — Z86711 Personal history of pulmonary embolism: Secondary | ICD-10-CM | POA: Diagnosis not present

## 2015-05-06 DIAGNOSIS — M5134 Other intervertebral disc degeneration, thoracic region: Secondary | ICD-10-CM | POA: Diagnosis not present

## 2015-05-06 HISTORY — DX: Family history of other specified conditions: Z84.89

## 2015-05-06 HISTORY — DX: Gastro-esophageal reflux disease without esophagitis: K21.9

## 2015-05-06 HISTORY — DX: Hyperlipidemia, unspecified: E78.5

## 2015-05-06 HISTORY — DX: Pneumonia, unspecified organism: J18.9

## 2015-05-06 LAB — URINALYSIS, ROUTINE W REFLEX MICROSCOPIC
Bilirubin Urine: NEGATIVE
Glucose, UA: NEGATIVE mg/dL
Hgb urine dipstick: NEGATIVE
Ketones, ur: NEGATIVE mg/dL
Leukocytes, UA: NEGATIVE
Nitrite: NEGATIVE
Protein, ur: NEGATIVE mg/dL
Specific Gravity, Urine: 1.006 (ref 1.005–1.030)
Urobilinogen, UA: 1 mg/dL (ref 0.0–1.0)
pH: 6.5 (ref 5.0–8.0)

## 2015-05-06 LAB — CBC
HCT: 39 % (ref 36.0–46.0)
Hemoglobin: 13.3 g/dL (ref 12.0–15.0)
MCH: 30.5 pg (ref 26.0–34.0)
MCHC: 34.1 g/dL (ref 30.0–36.0)
MCV: 89.4 fL (ref 78.0–100.0)
Platelets: 209 10*3/uL (ref 150–400)
RBC: 4.36 MIL/uL (ref 3.87–5.11)
RDW: 13.8 % (ref 11.5–15.5)
WBC: 11.9 10*3/uL — ABNORMAL HIGH (ref 4.0–10.5)

## 2015-05-06 LAB — GLUCOSE, CAPILLARY: Glucose-Capillary: 107 mg/dL — ABNORMAL HIGH (ref 65–99)

## 2015-05-06 LAB — COMPREHENSIVE METABOLIC PANEL
ALT: 14 U/L (ref 14–54)
AST: 21 U/L (ref 15–41)
Albumin: 3.5 g/dL (ref 3.5–5.0)
Alkaline Phosphatase: 104 U/L (ref 38–126)
Anion gap: 8 (ref 5–15)
BUN: 15 mg/dL (ref 6–20)
CO2: 21 mmol/L — ABNORMAL LOW (ref 22–32)
Calcium: 9.5 mg/dL (ref 8.9–10.3)
Chloride: 107 mmol/L (ref 101–111)
Creatinine, Ser: 1.14 mg/dL — ABNORMAL HIGH (ref 0.44–1.00)
GFR calc Af Amer: 56 mL/min — ABNORMAL LOW (ref >60)
GFR calc non Af Amer: 48 mL/min — ABNORMAL LOW (ref >60)
Glucose, Bld: 116 mg/dL — ABNORMAL HIGH (ref 65–99)
Potassium: 4.2 mmol/L (ref 3.5–5.1)
Sodium: 136 mmol/L (ref 135–145)
Total Bilirubin: 0.8 mg/dL (ref 0.3–1.2)
Total Protein: 7.1 g/dL (ref 6.5–8.1)

## 2015-05-06 LAB — BLOOD GAS, ARTERIAL
Acid-base deficit: 2 mmol/L (ref 0.0–2.0)
Bicarbonate: 20.8 mEq/L (ref 20.0–24.0)
Drawn by: 421801
FIO2: 0.21 %
O2 Saturation: 98.8 %
Patient temperature: 98.6
TCO2: 21.7 mmol/L (ref 0–100)
pCO2 arterial: 27.1 mmHg — ABNORMAL LOW (ref 35.0–45.0)
pH, Arterial: 7.498 — ABNORMAL HIGH (ref 7.350–7.450)
pO2, Arterial: 113 mmHg — ABNORMAL HIGH (ref 80.0–100.0)

## 2015-05-06 LAB — PROTIME-INR
INR: 1.04 (ref 0.00–1.49)
Prothrombin Time: 13.8 seconds (ref 11.6–15.2)

## 2015-05-06 LAB — ABO/RH: ABO/RH(D): A POS

## 2015-05-06 LAB — APTT: aPTT: 35 seconds (ref 24–37)

## 2015-05-06 NOTE — Progress Notes (Signed)
Nurse noticed that PCR swab had been canceled after it was sent to lab. Nurse called Sharon Price and spoke with her to inquire why PCR swab had been canceled. Sharon Price stated she called Short Stay and was told that there was not a patient here by that name and the PCR did not have a label on the tube itself, only on the bag, so it was canceled. Nurse explained to [facility] that a label was on the PCR tube itself, and if it was not then Short Stay lab personnel would not know who the swab belonged to, and it would have never been sent down. Sharon Price stated she could prove that short stay lab labeled the bag and placed Nurse on hold. Nurse spoke with Short Stay lab techs and they all agreed that the PCR would not be sent to main lab without a label. Since PCR was canceled without primary Nurses approval, PCR will be ordered STAT for DOS.

## 2015-05-06 NOTE — Progress Notes (Signed)
PCP is Reinaldo Meeker and Cardiologist is Jenkins Rouge. Patient denied having any acute cardiac issues, but informed Nurse that she last used Proair inhaler two days ago. Patient denied having any current shortness of breath at this time.   CBG on arrival to PAT was 107. Patient informed Nurse that she did not check her blood sugar this morning because she was "rushing to get here", but she was "hungry." Patient also stated that the highest her blood sugar has been was in the 200s and the lowest was 69. Diabetic education given. Patient verbalized understanding.  When asked about Xarelto, patient stated she was instructed to stop it on Wednesday July 6th.

## 2015-05-09 MED ORDER — DEXTROSE 5 % IV SOLN
1.5000 g | INTRAVENOUS | Status: AC
  Administered 2015-05-10 (×2): 1.5 g via INTRAVENOUS
  Filled 2015-05-09: qty 1.5

## 2015-05-09 NOTE — Progress Notes (Signed)
Anesthesia Chart Review:  Patient is a 69 year old female scheduled for video bronchoscopy, transhiatal total esophageal resection, feeding jejunostomy on 05/10/15 by Dr. Servando Snare.    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   PCP is Dr. Reinaldo Meeker. HEM-ONC is Dr. Lavera Guise. Dr. Servando Snare sent her to cardiologist Dr. Johnsie Cancel for pre-operative clearance. She was seen on 03/30/15. She had had some rapid heart beats over the last few weeks and EKG then showed aflutter at 110 bpm. She was started on b-blocker therapy and Xarelto and set up for echo.  Cardioversion was not required as patient converted to SR with medication treatment only. Per telephone encounter by Rolena Infante, RN, "We were going to send back to cardiology for clearance but per Dr. Johnsie Cancel: 'She converted spontaneously. She is on beta blocker echo is normal and she has no history of CAD or chest pain I think she should be ok for surgery without myoview.'"   Meds include ProAir, gabapentin, Norco, Lantus, lisinopril, lorazepam, metformin, Lopressor, Protonix, Xarelto, Zocor, Carafate. TCTS instructed her to hold Xarelto 5 days prior to surgery.  05/06/15 EKG: SR with first degree AVB, LAFB, minimal voltage criteria for LVH, may be normal variant. Poor r wave progression. Negative T waves in inferior leads III and aVF.  04/01/15 Echo: - Left ventricle: The cavity size was normal. Wall thickness was normal. Systolic function was normal. The estimated ejection fraction was in the range of 55% to 60%. Doppler parameters are consistent with  abnormal left ventricular relaxation (grade 1 diastolic dysfunction). - Aortic valve: There was mild regurgitation. - Mitral valve: There was mild regurgitation. - Pericardium, extracardiac: A trivial pericardial effusion was identified.  05/06/15 CXR: IMPRESSION: Chronically increased interstitial markings are consistent with the patient's smoking history. There is no active cardiopulmonary disease.  03/24/15 PFTs: FVC 2.34 (86%), FEV1 1.99 (98%), DLCOunc 16.24 (80%).   Preoperative labs noted. Cr 1.14. H/H 13.3/39.0. PT/PTT WNL. Glucose 116. T&S done. A1C was not done at PAT, and lab was unable to add it so it is scheduled for the day of surgery.  PCR was also not done reportedly due to labeling issue.  This will be done on arrival as well.  Cardiologist confirmed EKG as SR on 05/06/15.  She is on Lopressor. She was maintaining SR as of 05/06/15. If no acute changes then I would anticipate that she could proceed as planned.  George Hugh [facility] Short Stay Center/Anesthesiology Phone [phone] 05/09/2015 9:53 AM

## 2015-05-10 ENCOUNTER — Inpatient Hospital Stay (HOSPITAL_COMMUNITY)
Admission: RE | Admit: 2015-05-10 | Discharge: 2015-06-17 | DRG: 327 | Disposition: A | Discharge status: Home Health | Payer: Commercial Managed Care - PPO | Source: Ambulatory Visit | Attending: Cardiothoracic Surgery | Admitting: Cardiothoracic Surgery

## 2015-05-10 ENCOUNTER — Inpatient Hospital Stay (HOSPITAL_COMMUNITY): Payer: Commercial Managed Care - PPO | Admitting: Anesthesiology

## 2015-05-10 ENCOUNTER — Encounter (HOSPITAL_COMMUNITY)
Admission: RE | Disposition: A | Discharge status: Home Health | Payer: Self-pay | Source: Ambulatory Visit | Attending: Cardiothoracic Surgery

## 2015-05-10 ENCOUNTER — Encounter (HOSPITAL_COMMUNITY): Payer: Self-pay | Admitting: *Deleted

## 2015-05-10 ENCOUNTER — Inpatient Hospital Stay (HOSPITAL_COMMUNITY): Payer: Commercial Managed Care - PPO

## 2015-05-10 DIAGNOSIS — N182 Chronic kidney disease, stage 2 (mild): Secondary | ICD-10-CM | POA: Diagnosis present

## 2015-05-10 DIAGNOSIS — N179 Acute kidney failure, unspecified: Secondary | ICD-10-CM | POA: Diagnosis not present

## 2015-05-10 DIAGNOSIS — C160 Malignant neoplasm of cardia: Secondary | ICD-10-CM | POA: Diagnosis present

## 2015-05-10 DIAGNOSIS — T85638A Leakage of other specified internal prosthetic devices, implants and grafts, initial encounter: Secondary | ICD-10-CM | POA: Diagnosis not present

## 2015-05-10 DIAGNOSIS — E8770 Fluid overload, unspecified: Secondary | ICD-10-CM | POA: Diagnosis present

## 2015-05-10 DIAGNOSIS — K9189 Other postprocedural complications and disorders of digestive system: Secondary | ICD-10-CM

## 2015-05-10 DIAGNOSIS — I4892 Unspecified atrial flutter: Secondary | ICD-10-CM | POA: Diagnosis present

## 2015-05-10 DIAGNOSIS — Z87891 Personal history of nicotine dependence: Secondary | ICD-10-CM | POA: Diagnosis not present

## 2015-05-10 DIAGNOSIS — K913 Postprocedural intestinal obstruction: Secondary | ICD-10-CM | POA: Diagnosis not present

## 2015-05-10 DIAGNOSIS — Z7901 Long term (current) use of anticoagulants: Secondary | ICD-10-CM

## 2015-05-10 DIAGNOSIS — J9811 Atelectasis: Secondary | ICD-10-CM | POA: Diagnosis not present

## 2015-05-10 DIAGNOSIS — E119 Type 2 diabetes mellitus without complications: Secondary | ICD-10-CM | POA: Diagnosis present

## 2015-05-10 DIAGNOSIS — I129 Hypertensive chronic kidney disease with stage 1 through stage 4 chronic kidney disease, or unspecified chronic kidney disease: Secondary | ICD-10-CM | POA: Diagnosis present

## 2015-05-10 DIAGNOSIS — E1122 Type 2 diabetes mellitus with diabetic chronic kidney disease: Secondary | ICD-10-CM | POA: Diagnosis present

## 2015-05-10 DIAGNOSIS — E871 Hypo-osmolality and hyponatremia: Secondary | ICD-10-CM | POA: Diagnosis present

## 2015-05-10 DIAGNOSIS — L7682 Other postprocedural complications of skin and subcutaneous tissue: Secondary | ICD-10-CM | POA: Diagnosis not present

## 2015-05-10 DIAGNOSIS — R32 Unspecified urinary incontinence: Secondary | ICD-10-CM | POA: Diagnosis not present

## 2015-05-10 DIAGNOSIS — C159 Malignant neoplasm of esophagus, unspecified: Secondary | ICD-10-CM

## 2015-05-10 DIAGNOSIS — L8990 Pressure ulcer of unspecified site, unspecified stage: Secondary | ICD-10-CM | POA: Insufficient documentation

## 2015-05-10 DIAGNOSIS — C155 Malignant neoplasm of lower third of esophagus: Secondary | ICD-10-CM | POA: Diagnosis present

## 2015-05-10 DIAGNOSIS — I484 Atypical atrial flutter: Secondary | ICD-10-CM | POA: Diagnosis not present

## 2015-05-10 DIAGNOSIS — Z981 Arthrodesis status: Secondary | ICD-10-CM | POA: Diagnosis not present

## 2015-05-10 DIAGNOSIS — K219 Gastro-esophageal reflux disease without esophagitis: Secondary | ICD-10-CM | POA: Diagnosis present

## 2015-05-10 DIAGNOSIS — R040 Epistaxis: Secondary | ICD-10-CM | POA: Diagnosis not present

## 2015-05-10 DIAGNOSIS — Z9889 Other specified postprocedural states: Secondary | ICD-10-CM

## 2015-05-10 DIAGNOSIS — Z86711 Personal history of pulmonary embolism: Secondary | ICD-10-CM

## 2015-05-10 DIAGNOSIS — Z9049 Acquired absence of other specified parts of digestive tract: Secondary | ICD-10-CM

## 2015-05-10 DIAGNOSIS — D62 Acute posthemorrhagic anemia: Secondary | ICD-10-CM | POA: Diagnosis not present

## 2015-05-10 DIAGNOSIS — J029 Acute pharyngitis, unspecified: Secondary | ICD-10-CM | POA: Diagnosis not present

## 2015-05-10 DIAGNOSIS — K222 Esophageal obstruction: Secondary | ICD-10-CM | POA: Diagnosis present

## 2015-05-10 DIAGNOSIS — E785 Hyperlipidemia, unspecified: Secondary | ICD-10-CM | POA: Diagnosis present

## 2015-05-10 DIAGNOSIS — K229 Disease of esophagus, unspecified: Secondary | ICD-10-CM

## 2015-05-10 DIAGNOSIS — Z9689 Presence of other specified functional implants: Secondary | ICD-10-CM

## 2015-05-10 DIAGNOSIS — I480 Paroxysmal atrial fibrillation: Secondary | ICD-10-CM | POA: Diagnosis present

## 2015-05-10 DIAGNOSIS — K30 Functional dyspepsia: Secondary | ICD-10-CM

## 2015-05-10 DIAGNOSIS — I483 Typical atrial flutter: Secondary | ICD-10-CM | POA: Diagnosis not present

## 2015-05-10 DIAGNOSIS — E860 Dehydration: Secondary | ICD-10-CM | POA: Diagnosis present

## 2015-05-10 DIAGNOSIS — Z90710 Acquired absence of both cervix and uterus: Secondary | ICD-10-CM

## 2015-05-10 DIAGNOSIS — I9789 Other postprocedural complications and disorders of the circulatory system, not elsewhere classified: Secondary | ICD-10-CM | POA: Diagnosis not present

## 2015-05-10 HISTORY — PX: VIDEO BRONCHOSCOPY: SHX5072

## 2015-05-10 HISTORY — PX: JEJUNOSTOMY: SHX313

## 2015-05-10 HISTORY — PX: COMPLETE ESOPHAGECTOMY: SHX5286

## 2015-05-10 LAB — POCT I-STAT 7, (LYTES, BLD GAS, ICA,H+H)
Acid-base deficit: 1 mmol/L (ref 0.0–2.0)
Bicarbonate: 24.3 mEq/L — ABNORMAL HIGH (ref 20.0–24.0)
Calcium, Ion: 1.22 mmol/L (ref 1.13–1.30)
HCT: 36 % (ref 36.0–46.0)
Hemoglobin: 12.2 g/dL (ref 12.0–15.0)
O2 Saturation: 100 %
Patient temperature: 34.3
Potassium: 3.7 mmol/L (ref 3.5–5.1)
Sodium: 135 mmol/L (ref 135–145)
TCO2: 26 mmol/L (ref 0–100)
pCO2 arterial: 35.9 mmHg (ref 35.0–45.0)
pH, Arterial: 7.427 (ref 7.350–7.450)
pO2, Arterial: 410 mmHg — ABNORMAL HIGH (ref 80.0–100.0)

## 2015-05-10 LAB — POCT I-STAT 3, ART BLOOD GAS (G3+)
Acid-base deficit: 3 mmol/L — ABNORMAL HIGH (ref 0.0–2.0)
Bicarbonate: 21.8 mEq/L (ref 20.0–24.0)
O2 Saturation: 99 %
Patient temperature: 94.4
TCO2: 23 mmol/L (ref 0–100)
pCO2 arterial: 33.6 mmHg — ABNORMAL LOW (ref 35.0–45.0)
pH, Arterial: 7.409 (ref 7.350–7.450)
pO2, Arterial: 126 mmHg — ABNORMAL HIGH (ref 80.0–100.0)

## 2015-05-10 LAB — GLUCOSE, CAPILLARY
Glucose-Capillary: 92 mg/dL (ref 65–99)
Glucose-Capillary: 195 mg/dL — ABNORMAL HIGH (ref 65–99)
Glucose-Capillary: 193 mg/dL — ABNORMAL HIGH (ref 65–99)
Glucose-Capillary: 178 mg/dL — ABNORMAL HIGH (ref 65–99)
Glucose-Capillary: 160 mg/dL — ABNORMAL HIGH (ref 65–99)
Glucose-Capillary: 132 mg/dL — ABNORMAL HIGH (ref 65–99)

## 2015-05-10 LAB — BASIC METABOLIC PANEL
Anion gap: 7 (ref 5–15)
BUN: 12 mg/dL (ref 6–20)
CHLORIDE: 106 mmol/L (ref 101–111)
CO2: 20 mmol/L — AB (ref 22–32)
CREATININE: 1.12 mg/dL — AB (ref 0.44–1.00)
Calcium: 8 mg/dL — ABNORMAL LOW (ref 8.9–10.3)
GFR calc non Af Amer: 49 mL/min — ABNORMAL LOW (ref >60)
GFR, EST AFRICAN AMERICAN: 57 mL/min — AB (ref >60)
Glucose, Bld: 195 mg/dL — ABNORMAL HIGH (ref 65–99)
Potassium: 4.3 mmol/L (ref 3.5–5.1)
Sodium: 133 mmol/L — ABNORMAL LOW (ref 135–145)

## 2015-05-10 LAB — CBC
HEMATOCRIT: 36.9 % (ref 36.0–46.0)
Hemoglobin: 12.7 g/dL (ref 12.0–15.0)
MCH: 30.4 pg (ref 26.0–34.0)
MCHC: 34.4 g/dL (ref 30.0–36.0)
MCV: 88.3 fL (ref 78.0–100.0)
Platelets: 139 10*3/uL — ABNORMAL LOW (ref 150–400)
RBC: 4.18 MIL/uL (ref 3.87–5.11)
RDW: 13.8 % (ref 11.5–15.5)
WBC: 8.7 10*3/uL (ref 4.0–10.5)

## 2015-05-10 LAB — PREPARE RBC (CROSSMATCH)

## 2015-05-10 LAB — SURGICAL PCR SCREEN
MRSA, PCR: NEGATIVE
Staphylococcus aureus: NEGATIVE

## 2015-05-10 LAB — POCT I-STAT GLUCOSE
Glucose, Bld: 188 mg/dL — ABNORMAL HIGH (ref 65–99)
Operator id: 147011

## 2015-05-10 SURGERY — BRONCHOSCOPY, VIDEO-ASSISTED
Anesthesia: General | Site: Abdomen

## 2015-05-10 MED ORDER — GLYCOPYRROLATE 0.2 MG/ML IJ SOLN
INTRAMUSCULAR | Status: DC | PRN
  Administered 2015-05-10 (×3): 0.2 mg via INTRAVENOUS
  Administered 2015-05-10: 0.4 mg via INTRAVENOUS

## 2015-05-10 MED ORDER — PROPOFOL 10 MG/ML IV BOLUS
INTRAVENOUS | Status: AC
  Filled 2015-05-10: qty 20

## 2015-05-10 MED ORDER — PANTOPRAZOLE SODIUM 40 MG IV SOLR
40.0000 mg | Freq: Two times a day (BID) | INTRAVENOUS | Status: AC
  Administered 2015-05-10 – 2015-06-10 (×63): 40 mg via INTRAVENOUS
  Filled 2015-05-10 (×71): qty 40

## 2015-05-10 MED ORDER — FENTANYL CITRATE (PF) 250 MCG/5ML IJ SOLN
INTRAMUSCULAR | Status: AC
  Filled 2015-05-10: qty 5

## 2015-05-10 MED ORDER — METOPROLOL TARTRATE 1 MG/ML IV SOLN
5.0000 mg | Freq: Two times a day (BID) | INTRAVENOUS | Status: DC
  Administered 2015-05-13 – 2015-05-17 (×4): 5 mg via INTRAVENOUS
  Filled 2015-05-10 (×15): qty 5

## 2015-05-10 MED ORDER — SODIUM CHLORIDE 0.9 % IJ SOLN
10.0000 mL | Freq: Two times a day (BID) | INTRAMUSCULAR | Status: DC
  Administered 2015-05-10 – 2015-06-17 (×53): 10 mL via INTRAVENOUS

## 2015-05-10 MED ORDER — CHLORHEXIDINE GLUCONATE 0.12 % MT SOLN
15.0000 mL | Freq: Two times a day (BID) | OROMUCOSAL | Status: DC
  Administered 2015-05-10 – 2015-05-20 (×21): 15 mL via OROMUCOSAL
  Filled 2015-05-10 (×19): qty 15

## 2015-05-10 MED ORDER — LACTATED RINGERS IV SOLN
INTRAVENOUS | Status: DC | PRN
  Administered 2015-05-10 (×2): via INTRAVENOUS

## 2015-05-10 MED ORDER — PANTOPRAZOLE SODIUM 40 MG IV SOLR: 40.0000 mg | INTRAVENOUS | Status: DC

## 2015-05-10 MED ORDER — 0.9 % SODIUM CHLORIDE (POUR BTL) OPTIME
TOPICAL | Status: DC | PRN
  Administered 2015-05-10 (×2): 1000 mL

## 2015-05-10 MED ORDER — ALBUMIN HUMAN 5 % IV SOLN
INTRAVENOUS | Status: AC
  Administered 2015-05-10: 12.5 g
  Filled 2015-05-10: qty 250

## 2015-05-10 MED ORDER — POTASSIUM CHLORIDE 10 MEQ/50ML IV SOLN
10.0000 meq | INTRAVENOUS | Status: DC | PRN
  Administered 2015-05-16 – 2015-05-22 (×9): 10 meq via INTRAVENOUS
  Filled 2015-05-10 (×7): qty 50

## 2015-05-10 MED ORDER — MIDAZOLAM HCL 5 MG/5ML IJ SOLN
INTRAMUSCULAR | Status: DC | PRN
  Administered 2015-05-10 (×2): 2 mg via INTRAVENOUS

## 2015-05-10 MED ORDER — CETYLPYRIDINIUM CHLORIDE 0.05 % MT LIQD
7.0000 mL | Freq: Four times a day (QID) | OROMUCOSAL | Status: DC
  Administered 2015-05-11 – 2015-06-17 (×131): 7 mL via OROMUCOSAL

## 2015-05-10 MED ORDER — MUPIROCIN 2 % EX OINT
1.0000 "application " | TOPICAL_OINTMENT | Freq: Once | CUTANEOUS | Status: AC
  Administered 2015-05-10: 1 via TOPICAL

## 2015-05-10 MED ORDER — MIDAZOLAM HCL 2 MG/2ML IJ SOLN
INTRAMUSCULAR | Status: AC
  Filled 2015-05-10: qty 2

## 2015-05-10 MED ORDER — DIPHENHYDRAMINE HCL 50 MG/ML IJ SOLN: 12.5000 mg | Freq: Four times a day (QID) | INTRAMUSCULAR | Status: DC | PRN

## 2015-05-10 MED ORDER — ONDANSETRON HCL 4 MG/2ML IJ SOLN: 4.0000 mg | Freq: Four times a day (QID) | INTRAMUSCULAR | Status: DC | PRN

## 2015-05-10 MED ORDER — ALBUMIN HUMAN 5 % IV SOLN
INTRAVENOUS | Status: DC | PRN
  Administered 2015-05-10: 17:00:00 via INTRAVENOUS

## 2015-05-10 MED ORDER — PROPOFOL 10 MG/ML IV BOLUS
INTRAVENOUS | Status: DC | PRN
  Administered 2015-05-10 (×2): 100 mg via INTRAVENOUS
  Administered 2015-05-10 (×2): 50 mg via INTRAVENOUS

## 2015-05-10 MED ORDER — METOCLOPRAMIDE HCL 5 MG/ML IJ SOLN
10.0000 mg | Freq: Four times a day (QID) | INTRAMUSCULAR | Status: DC
  Administered 2015-05-10 – 2015-05-18 (×31): 10 mg via INTRAVENOUS
  Filled 2015-05-10 (×37): qty 2

## 2015-05-10 MED ORDER — PHENYLEPHRINE HCL 10 MG/ML IJ SOLN
0.0000 ug/min | INTRAMUSCULAR | Status: DC
  Administered 2015-05-10: 5 ug/min via INTRAVENOUS
  Administered 2015-05-11 – 2015-05-12 (×2): 30 ug/min via INTRAVENOUS
  Administered 2015-05-12: 10 ug/min via INTRAVENOUS
  Administered 2015-05-13: 15 ug/min via INTRAVENOUS
  Filled 2015-05-10 (×8): qty 1

## 2015-05-10 MED ORDER — ONDANSETRON HCL 4 MG/2ML IJ SOLN: 4.0000 mg | INTRAMUSCULAR | Status: DC | PRN

## 2015-05-10 MED ORDER — ALBUMIN HUMAN 5 % IV SOLN
12.5000 g | Freq: Once | INTRAVENOUS | Status: AC
  Administered 2015-05-10: 12.5 g via INTRAVENOUS
  Filled 2015-05-10: qty 250

## 2015-05-10 MED ORDER — FENTANYL 10 MCG/ML IV SOLN
INTRAVENOUS | Status: DC
  Administered 2015-05-11: 04:00:00 via INTRAVENOUS
  Administered 2015-05-11: 30 ug via INTRAVENOUS
  Administered 2015-05-11: 110 ug via INTRAVENOUS
  Administered 2015-05-11: 20 ug via INTRAVENOUS
  Administered 2015-05-11: 150 ug via INTRAVENOUS
  Administered 2015-05-12: 110 ug via INTRAVENOUS
  Administered 2015-05-12: 150 ug via INTRAVENOUS
  Administered 2015-05-12: 13:00:00 via INTRAVENOUS
  Administered 2015-05-12: 130 ug via INTRAVENOUS
  Administered 2015-05-12: 100 ug via INTRAVENOUS
  Administered 2015-05-12: 180 ug via INTRAVENOUS
  Administered 2015-05-12: 50 mL via INTRAVENOUS
  Administered 2015-05-13: 07:00:00 via INTRAVENOUS
  Administered 2015-05-13: 130 ug via INTRAVENOUS
  Administered 2015-05-13: 190 ug via INTRAVENOUS
  Administered 2015-05-13: 180 ug via INTRAVENOUS
  Administered 2015-05-13: 80 ug via INTRAVENOUS
  Administered 2015-05-13: 70 ug via INTRAVENOUS
  Administered 2015-05-13: 120 ug via INTRAVENOUS
  Administered 2015-05-14: 150 ug via INTRAVENOUS
  Administered 2015-05-14: 110 ug via INTRAVENOUS
  Administered 2015-05-14: 150 ug via INTRAVENOUS
  Administered 2015-05-14: 130 ug via INTRAVENOUS
  Administered 2015-05-14: 14:00:00 via INTRAVENOUS
  Administered 2015-05-14: 160 ug via INTRAVENOUS
  Administered 2015-05-14: 70 ug via INTRAVENOUS
  Administered 2015-05-15: 04:00:00 via INTRAVENOUS
  Administered 2015-05-15: 100 ug via INTRAVENOUS
  Administered 2015-05-15: 22:00:00 via INTRAVENOUS
  Administered 2015-05-15: 80 ug via INTRAVENOUS
  Administered 2015-05-15: 70 ug via INTRAVENOUS
  Administered 2015-05-15: 180 ug via INTRAVENOUS
  Administered 2015-05-15: 190 ug via INTRAVENOUS
  Administered 2015-05-15: 40 ug via INTRAVENOUS
  Administered 2015-05-16 (×2): 50 ug via INTRAVENOUS
  Filled 2015-05-10 (×10): qty 50

## 2015-05-10 MED ORDER — MORPHINE SULFATE 2 MG/ML IJ SOLN
2.0000 mg | INTRAMUSCULAR | Status: DC | PRN
  Administered 2015-05-10 (×2): 1 mg via INTRAVENOUS
  Administered 2015-05-10: 2 mg via INTRAVENOUS
  Filled 2015-05-10 (×2): qty 1

## 2015-05-10 MED ORDER — ALBUTEROL SULFATE (2.5 MG/3ML) 0.083% IN NEBU: 2.5000 mg | INHALATION_SOLUTION | RESPIRATORY_TRACT | Status: AC | PRN

## 2015-05-10 MED ORDER — VECURONIUM BROMIDE 10 MG IV SOLR
INTRAVENOUS | Status: DC | PRN
  Administered 2015-05-10 (×2): 5 mg via INTRAVENOUS

## 2015-05-10 MED ORDER — CALCIUM CHLORIDE 10 % IV SOLN
1.0000 g | Freq: Once | INTRAVENOUS | Status: AC
  Administered 2015-05-10: 1 g via INTRAVENOUS
  Filled 2015-05-10: qty 10

## 2015-05-10 MED ORDER — INSULIN ASPART 100 UNIT/ML ~~LOC~~ SOLN: 0.0000 [IU] | Freq: Three times a day (TID) | SUBCUTANEOUS | Status: DC

## 2015-05-10 MED ORDER — POTASSIUM CHLORIDE IN NACL 20-0.45 MEQ/L-% IV SOLN
INTRAVENOUS | Status: DC
  Administered 2015-05-10: 100 mL/h via INTRAVENOUS
  Administered 2015-05-11 (×2): via INTRAVENOUS
  Administered 2015-05-12: 100 mL/h via INTRAVENOUS
  Administered 2015-05-12 – 2015-05-13 (×3): via INTRAVENOUS
  Filled 2015-05-10 (×10): qty 1000

## 2015-05-10 MED ORDER — GLYCOPYRROLATE 0.2 MG/ML IJ SOLN
INTRAMUSCULAR | Status: AC
  Filled 2015-05-10: qty 2

## 2015-05-10 MED ORDER — FENTANYL CITRATE (PF) 100 MCG/2ML IJ SOLN
INTRAMUSCULAR | Status: DC | PRN
  Administered 2015-05-10: 150 ug via INTRAVENOUS
  Administered 2015-05-10: 300 ug via INTRAVENOUS
  Administered 2015-05-10 (×6): 50 ug via INTRAVENOUS
  Administered 2015-05-10 (×2): 100 ug via INTRAVENOUS
  Administered 2015-05-10: 250 ug via INTRAVENOUS
  Administered 2015-05-10 (×2): 50 ug via INTRAVENOUS
  Administered 2015-05-10: 100 ug via INTRAVENOUS
  Administered 2015-05-10 (×2): 50 ug via INTRAVENOUS

## 2015-05-10 MED ORDER — CEFOXITIN SODIUM 2 G IV SOLR: 2.0000 g | Freq: Four times a day (QID) | INTRAVENOUS | Status: DC

## 2015-05-10 MED ORDER — SODIUM CHLORIDE 0.9 % IV SOLN
10.0000 mg | INTRAVENOUS | Status: DC | PRN
  Administered 2015-05-10: 25 ug/min via INTRAVENOUS

## 2015-05-10 MED ORDER — DEXMEDETOMIDINE HCL IN NACL 200 MCG/50ML IV SOLN
0.0000 ug/kg/h | INTRAVENOUS | Status: DC
  Filled 2015-05-10: qty 50

## 2015-05-10 MED ORDER — DEXMEDETOMIDINE HCL IN NACL 400 MCG/100ML IV SOLN
0.4000 ug/kg/h | INTRAVENOUS | Status: AC
  Administered 2015-05-10: .5 ug/kg/h via INTRAVENOUS
  Filled 2015-05-10: qty 100

## 2015-05-10 MED ORDER — GLYCOPYRROLATE 0.2 MG/ML IJ SOLN
INTRAMUSCULAR | Status: AC
  Filled 2015-05-10: qty 4

## 2015-05-10 MED ORDER — SODIUM CHLORIDE 0.9 % IV SOLN
INTRAVENOUS | Status: DC
  Administered 2015-05-10: 2.7 [IU]/h via INTRAVENOUS
  Filled 2015-05-10 (×2): qty 2.5

## 2015-05-10 MED ORDER — EPHEDRINE SULFATE 50 MG/ML IJ SOLN
INTRAMUSCULAR | Status: DC | PRN
  Administered 2015-05-10 (×5): 5 mg via INTRAVENOUS

## 2015-05-10 MED ORDER — SODIUM CHLORIDE 0.9 % IJ SOLN: 9.0000 mL | INTRAMUSCULAR | Status: DC | PRN

## 2015-05-10 MED ORDER — LIDOCAINE HCL (CARDIAC) 20 MG/ML IV SOLN
INTRAVENOUS | Status: DC | PRN
  Administered 2015-05-10: 60 mg via INTRAVENOUS
  Administered 2015-05-10: 40 mg via INTRAVENOUS

## 2015-05-10 MED ORDER — MUPIROCIN 2 % EX OINT
TOPICAL_OINTMENT | CUTANEOUS | Status: AC
  Filled 2015-05-10: qty 22

## 2015-05-10 MED ORDER — HYDRALAZINE HCL 20 MG/ML IJ SOLN: 10.0000 mg | Freq: Four times a day (QID) | INTRAMUSCULAR | Status: DC | PRN

## 2015-05-10 MED ORDER — ALBUMIN HUMAN 5 % IV SOLN
12.5000 g | Freq: Once | INTRAVENOUS | Status: DC
Start: 2015-05-10 — End: 2015-05-14
  Filled 2015-05-10 (×2): qty 250

## 2015-05-10 MED ORDER — ONDANSETRON HCL 4 MG/2ML IJ SOLN
INTRAMUSCULAR | Status: DC | PRN
  Administered 2015-05-10: 4 mg via INTRAVENOUS

## 2015-05-10 MED ORDER — DEXTROSE 5 % IV SOLN
1.5000 g | INTRAVENOUS | Status: DC
  Filled 2015-05-10: qty 1.5

## 2015-05-10 MED ORDER — SUCCINYLCHOLINE CHLORIDE 20 MG/ML IJ SOLN
INTRAMUSCULAR | Status: DC | PRN
  Administered 2015-05-10: 120 mg via INTRAVENOUS

## 2015-05-10 MED ORDER — DEXTROSE 5 % IV SOLN
2.0000 g | Freq: Three times a day (TID) | INTRAVENOUS | Status: AC
  Administered 2015-05-10 – 2015-05-11 (×3): 2 g via INTRAVENOUS
  Filled 2015-05-10 (×3): qty 2

## 2015-05-10 MED ORDER — NALOXONE HCL 0.4 MG/ML IJ SOLN: 0.4000 mg | INTRAMUSCULAR | Status: DC | PRN

## 2015-05-10 SURGICAL SUPPLY — 124 items
BANDAGE HEMOSTAT MRDH 4X4 STRL (MISCELLANEOUS) IMPLANT
BNDG HEMOSTAT MRDH 4X4 STRL (MISCELLANEOUS)
BRUSH CYTOL CELLEBRITY 1.5X140 (MISCELLANEOUS) IMPLANT
CANISTER SUCTION 2500CC (MISCELLANEOUS) ×6 IMPLANT
CATH FOLEY 2WAY SLVR 18FR 30CC (CATHETERS) ×3 IMPLANT
CATH ROBINSON RED A/P 18FR (CATHETERS) ×3 IMPLANT
CATH THORACIC 28FR (CATHETERS) ×6 IMPLANT
CLIP FOGARTY SPRING 6M (CLIP) ×3 IMPLANT
CLIP TI LARGE 6 (CLIP) IMPLANT
CLIP TI MEDIUM 24 (CLIP) ×3 IMPLANT
CLIP TI WIDE RED SMALL 24 (CLIP) ×3 IMPLANT
CONT SPEC 4OZ CLIKSEAL STRL BL (MISCELLANEOUS) ×9 IMPLANT
COUNTER NEEDLE 20 DBL MAG RED (NEEDLE) ×3 IMPLANT
COVER PROBE W GEL 5X96 (DRAPES) ×3 IMPLANT
COVER SURGICAL LIGHT HANDLE (MISCELLANEOUS) ×3 IMPLANT
COVER TABLE BACK 60X90 (DRAPES) ×3 IMPLANT
DERMABOND ADVANCED (GAUZE/BANDAGES/DRESSINGS) ×2
DERMABOND ADVANCED .7 DNX12 (GAUZE/BANDAGES/DRESSINGS) ×4 IMPLANT
DRAIN PENROSE 1/2X12 LTX STRL (WOUND CARE) ×3 IMPLANT
DRAIN PENROSE 1/2X36 STERILE (WOUND CARE) ×3 IMPLANT
DRAIN SUMP SARATOGA 24F (WOUND CARE) ×3 IMPLANT
DRAPE BILATERAL SPLIT (DRAPES) ×3 IMPLANT
DRAPE CAMERA VIDEO/LASER (DRAPES) ×3 IMPLANT
DRAPE CV SPLIT W-CLR ANES SCRN (DRAPES) ×3 IMPLANT
DRAPE INCISE IOBAN 66X45 STRL (DRAPES) ×6 IMPLANT
DRAPE PROXIMA HALF (DRAPES) ×3 IMPLANT
DRAPE SLUSH/WARMER DISC (DRAPES) ×3 IMPLANT
DRSG AQUACEL AG ADV 3.5X14 (GAUZE/BANDAGES/DRESSINGS) ×3 IMPLANT
ELECT BLADE 4.0 EZ CLEAN MEGAD (MISCELLANEOUS) ×3
ELECT BLADE 6.5 EXT (BLADE) ×3 IMPLANT
ELECT NEEDLE TIP 2.8 STRL (NEEDLE) ×3 IMPLANT
ELECT REM PT RETURN 9FT ADLT (ELECTROSURGICAL) ×6
ELECTRODE BLDE 4.0 EZ CLN MEGD (MISCELLANEOUS) ×2 IMPLANT
ELECTRODE REM PT RTRN 9FT ADLT (ELECTROSURGICAL) ×4 IMPLANT
FORCEPS BIOP RJ4 1.8 (CUTTING FORCEPS) IMPLANT
GAUZE SPONGE 4X4 12PLY STRL (GAUZE/BANDAGES/DRESSINGS) ×3 IMPLANT
GLOVE BIO SURGEON STRL SZ 6.5 (GLOVE) ×33 IMPLANT
GLOVE SKINSENSE NS SZ7.0 (GLOVE) ×1
GLOVE SKINSENSE STRL SZ7.0 (GLOVE) ×2 IMPLANT
GOWN BRE IMP SLV AUR XL STRL (GOWN DISPOSABLE) ×6 IMPLANT
GOWN STRL REUS W/ TWL LRG LVL3 (GOWN DISPOSABLE) ×10 IMPLANT
GOWN STRL REUS W/TWL LRG LVL3 (GOWN DISPOSABLE) ×5
HANDLE STAPLE ENDO GIA SHORT (STAPLE) ×1
HEMOSTAT SURGICEL 2X14 (HEMOSTASIS) IMPLANT
INSERT FOGARTY 61MM (MISCELLANEOUS) IMPLANT
KIT BASIN OR (CUSTOM PROCEDURE TRAY) ×3 IMPLANT
KIT CLEAN ENDO COMPLIANCE (KITS) ×3 IMPLANT
KIT ROOM TURNOVER OR (KITS) ×3 IMPLANT
KIT SUCTION CATH 14FR (SUCTIONS) ×3 IMPLANT
LIGASURE 5MM LAPAROSCOPIC (INSTRUMENTS) ×3 IMPLANT
LIGASURE IMPACT 36 18CM CVD LR (INSTRUMENTS) ×3 IMPLANT
LOCATOR NERVE 3 VOLT (DISPOSABLE) ×3 IMPLANT
LOOP VESSEL MINI RED (MISCELLANEOUS) IMPLANT
MARKER SKIN DUAL TIP RULER LAB (MISCELLANEOUS) ×3 IMPLANT
NEEDLE BIOPSY TRANSBRONCH 21G (NEEDLE) IMPLANT
NS IRRIG 1000ML POUR BTL (IV SOLUTION) ×6 IMPLANT
OIL SILICONE PENTAX (PARTS (SERVICE/REPAIRS)) IMPLANT
PACK CHEST (CUSTOM PROCEDURE TRAY) ×3 IMPLANT
PAD ARMBOARD 7.5X6 YLW CONV (MISCELLANEOUS) ×6 IMPLANT
PAD SHARPS MAGNETIC DISPOSAL (MISCELLANEOUS) ×3 IMPLANT
PENCIL BUTTON HOLSTER BLD 10FT (ELECTRODE) ×6 IMPLANT
PLUG CATH AND CAP STER (CATHETERS) ×3 IMPLANT
PROBE NERVBE PRASS .33 (MISCELLANEOUS) ×3 IMPLANT
PROBE PENCIL 8 MHZ STRL DISP (MISCELLANEOUS) ×3 IMPLANT
RELOAD EGIA 45 MED/THCK PURPLE (STAPLE) IMPLANT
RELOAD EGIA TRIS TAN 45 CVD (STAPLE) IMPLANT
RELOAD ENDO GIA 30 2.5 (STAPLE) ×3 IMPLANT
RELOAD ENDO GIA 30 3.5 (STAPLE) ×3 IMPLANT
RELOAD LINEAR CUT PROX 55 BLUE (ENDOMECHANICALS) ×12 IMPLANT
RETAINER VISCERA MED (MISCELLANEOUS) ×3 IMPLANT
RETRACTOR WND ALEXIS 25 LRG (MISCELLANEOUS) ×2 IMPLANT
RTRCTR WOUND ALEXIS 25CM LRG (MISCELLANEOUS) ×3
SPONGE GAUZE 4X4 12PLY STER LF (GAUZE/BANDAGES/DRESSINGS) ×3 IMPLANT
SPONGE LAP 18X18 X RAY DECT (DISPOSABLE) ×6 IMPLANT
STAPLER ENDO GIA 12MM SHORT (STAPLE) ×2 IMPLANT
STAPLER PROXIMATE 55 BLUE (STAPLE) ×3 IMPLANT
STAPLER VISISTAT 35W (STAPLE) IMPLANT
SUCTION POOLE TIP (SUCTIONS) ×3 IMPLANT
SUT ETHILON 3 0 FSL (SUTURE) ×9 IMPLANT
SUT PDS AB 4-0 SH 27 (SUTURE) IMPLANT
SUT PROLENE 0 CT 1 CR/8 (SUTURE) IMPLANT
SUT PROLENE 1 XLH (SUTURE) ×3 IMPLANT
SUT PROLENE 2 0 MH 48 (SUTURE) IMPLANT
SUT PROLENE 2 TP 1 (SUTURE) ×6 IMPLANT
SUT PROLENE 3 0 RB 1 (SUTURE) IMPLANT
SUT PROLENE 3 0 SH1 36 (SUTURE) ×3 IMPLANT
SUT PROLENE 4 0 RB 1 (SUTURE)
SUT PROLENE 4-0 RB1 .5 CRCL 36 (SUTURE) IMPLANT
SUT SILK  1 MH (SUTURE) ×1
SUT SILK 1 MH (SUTURE) ×2 IMPLANT
SUT SILK 1 TIES 10X30 (SUTURE) ×3 IMPLANT
SUT SILK 2 0 SH CR/8 (SUTURE) ×6 IMPLANT
SUT SILK 2 0SH CR/8 30 (SUTURE) ×3 IMPLANT
SUT SILK 3 0 SH CR/8 (SUTURE) ×3 IMPLANT
SUT SILK 3 0SH CR/8 30 (SUTURE) ×3 IMPLANT
SUT SILK 4 0 SH CR/8 (SUTURE) ×3 IMPLANT
SUT VIC AB 1 CTX 27 (SUTURE) IMPLANT
SUT VIC AB 2-0 CT1 18 (SUTURE) ×3 IMPLANT
SUT VIC AB 2-0 CTX 36 (SUTURE) ×6 IMPLANT
SUT VIC AB 3-0 MH 27 (SUTURE) IMPLANT
SUT VIC AB 3-0 SH 18 (SUTURE) IMPLANT
SUT VIC AB 3-0 X1 27 (SUTURE) ×9 IMPLANT
SUT VIC AB 4-0 SH 18 (SUTURE) ×15 IMPLANT
SUT VICRYL 2 TP 1 (SUTURE) IMPLANT
SYR 20CC LL (SYRINGE) IMPLANT
SYR 20ML ECCENTRIC (SYRINGE) ×3 IMPLANT
SYR 30ML SLIP (SYRINGE) ×3 IMPLANT
SYR 5ML LUER SLIP (SYRINGE) ×3 IMPLANT
SYR TOOMEY 50ML (SYRINGE) ×3 IMPLANT
SYRINGE 10CC LL (SYRINGE) IMPLANT
SYSTEM SAHARA CHEST DRAIN RE-I (WOUND CARE) ×3 IMPLANT
TAPE CLOTH SURG 4X10 WHT LF (GAUZE/BANDAGES/DRESSINGS) ×3 IMPLANT
TAPE UMBILICAL 1/8 X36 TWILL (MISCELLANEOUS) ×3 IMPLANT
TOWEL OR 17X24 6PK STRL BLUE (TOWEL DISPOSABLE) ×6 IMPLANT
TOWEL OR 17X26 10 PK STRL BLUE (TOWEL DISPOSABLE) ×6 IMPLANT
TRAP SPECIMEN MUCOUS 40CC (MISCELLANEOUS) ×3 IMPLANT
TRAY FOLEY CATH 16FRSI W/METER (SET/KITS/TRAYS/PACK) ×3 IMPLANT
TUBE CONNECTING 12X1/4 (SUCTIONS) IMPLANT
TUBE CONNECTING 20X1/4 (TUBING) ×3 IMPLANT
TUBE ENDOTRAC EMG 7X10.2 (MISCELLANEOUS) IMPLANT
TUBE ENDOTRAC EMG 8X11.3 (MISCELLANEOUS) IMPLANT
TUBE ENDOTRACH  EMG 6MMTUBE EN (MISCELLANEOUS)
TUBE ENDOTRACH EMG 6MMTUBE EN (MISCELLANEOUS) IMPLANT
WATER STERILE IRR 1000ML POUR (IV SOLUTION) ×3 IMPLANT

## 2015-05-10 NOTE — Progress Notes (Signed)
CT surgery p.m. Rounds  Status post transhiatal esophagectomy Sedated on ventilator with clear breath sounds Bilateral chest tubes without air leak or significant drainage Left neck dressing intact with minimal drainage Vital signs stable  Plan-after patient is warmed up, responsive proceed with ventilator weaning protocol.

## 2015-05-10 NOTE — Transfer of Care (Signed)
Immediate Anesthesia Transfer of Care Note  Patient: XOLANI DEGRACIA  Procedure(s) Performed: Procedure(s): VIDEO BRONCHOSCOPY (N/A) TRANSHIATAL TOTAL ESOPHAGEAL RESECTION (N/A) FEEDING JEJUNOSTOMY (N/A)  Patient Location: SICU  Anesthesia Type:General  Level of Consciousness: sedated and Patient remains intubated per anesthesia plan  Airway & Oxygen Therapy: Patient remains intubated per anesthesia plan and Patient placed on Ventilator (see vital sign flow sheet for setting)  Post-op Assessment: Report given to RN and Post -op Vital signs reviewed and stable  Post vital signs: Reviewed and stable  Last Vitals:  Filed Vitals:   05/10/15 1705  BP: 133/59  Pulse: 74  Temp:   Resp: 12    Complications: No apparent anesthesia complications

## 2015-05-10 NOTE — Anesthesia Preprocedure Evaluation (Signed)
Anesthesia Evaluation  Patient identified by MRN, date of birth, ID band Patient awake    Reviewed: Allergy & Precautions, NPO status , Patient's Chart, lab work & pertinent test results  Airway Mallampati: II  TM Distance: >3 FB Neck ROM: Full    Dental  (+) Edentulous Upper, Edentulous Lower   Pulmonary former smoker,  breath sounds clear to auscultation        Cardiovascular hypertension, Rhythm:Regular     Neuro/Psych    GI/Hepatic   Endo/Other  diabetes  Renal/GU      Musculoskeletal   Abdominal   Peds  Hematology   Anesthesia Other Findings   Reproductive/Obstetrics                             Anesthesia Physical Anesthesia Plan  ASA: III  Anesthesia Plan: General   Post-op Pain Management:    Induction: Intravenous  Airway Management Planned: Oral ETT  Additional Equipment: Arterial line and CVP  Intra-op Plan:   Post-operative Plan: Post-operative intubation/ventilation  Informed Consent: I have reviewed the patients History and Physical, chart, labs and discussed the procedure including the risks, benefits and alternatives for the proposed anesthesia with the patient or authorized representative who has indicated his/her understanding and acceptance.     Plan Discussed with: CRNA and Anesthesiologist  Anesthesia Plan Comments:         Anesthesia Quick Evaluation

## 2015-05-10 NOTE — H&P (Signed)
[address]       [place] [address]             [phone]                    Miyani M Folson Maria Antonia Medical Record #[id number] Date of Birth: 06-01-1946  Referring:Lewis, Chari Manning, MD Primary Care: Lillard Anes, MD  Chief Complaint:    Esopageal cancer   History of Present Illness:    Sharon Price 69 y.o. 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 The patient has completed neoadjuvant chemotherapy radiation with weekly carboplatin and and paclltaxel in AUG 2015. Her treatment course has been complicated with severe dehydration poor nutrition, requiring a PEG tube to be placed which  Became  infected and had to be replaced several times. The patient needed supplementary IV fluids through a PICC line.   She is markedly improved . Her weight has increased from 130-to 151, she's taking a by mouth diet without difficulty. She is still somoking "some".   Patient is noted on  CT scan 07/21/2014 to have evidence of pulmonary emboli, she is now on  xarelto  Repeat endoscopy in [place] is reported to confirm recurrent or persistent carcinoma in the distal esophagus on biopsy. Repeat endoscopy 03/31/2015 [id number] GE mass bx infiltrating moderate to poorly differentiated  Adenocarcinoma.   Patient has improved from when first seen and is being considered for resection, Repeat EUS and PET done.  I sent her for cardiology clearance. Dr Frances Nickels saw her , she complained her heart rate was fast and she was found to be in atrial flutter. She was scheduled for cardioversion, when she came for cardioversion she was in sinus rhyth and cardioversion was not done. She has been cleared from cardiology for thoracic surgery   Current Activity/ Functional Status:  Patient is independent with mobility/ambulation, transfers, ADL's, IADL's.   Zubrod Score: At the time of  surgery this patient's most appropriate activity status/level should be described as: []     0    Normal activity, no symptoms [x]     1    Restricted in physical strenuous activity but ambulatory, able to do out light work []     2    Ambulatory and capable of self care, unable to do work activities, up and about               >50 % of waking hours                              []     3    Only limited self care, in bed greater than 50% of waking hours []     4    Completely disabled, no self care, confined to bed or chair []     5    Moribund   Past Medical History  Diagnosis Date  . Chronic kidney disease     renal..STAGE 2  . Hypertension   . Mechanical dysphagia     MOSTLY SOLIDS  . Pulmonary emboli 9/223/15 [place]    RIGHT LOWER LOBE PER CT CHEST   . Patient on combined chemotherapy and radiation     [facility]  . Cancer 03/30/14    GE JUNCTION  . Transfusion history     ?  [facility].  . Family history of adverse reaction to anesthesia     Patients sister is very nauseated after anesthesia  . Pneumonia   . Diabetes mellitus without complication     Type 2  . Hyperlipemia   . GERD (gastroesophageal reflux disease)     Past Surgical History  Procedure Laterality Date  . Back surgery    . Abdominal hysterectomy    . Tubal ligation    . Eus N/A 04/08/2014    Procedure: ESOPHAGEAL ENDOSCOPIC ULTRASOUND (EUS) RADIAL;  Surgeon: Milus Banister, MD;  Location: [facility] ENDOSCOPY;  Service: Endoscopy;  Laterality: N/A;  . Appendectomy    . Spine surgery    . Lumbar fusion    . Esophagogastroduodenoscopy (egd) with propofol N/A 12/02/2014    Procedure: ESOPHAGOGASTRODUODENOSCOPY (EGD) WITH PROPOFOL;  Surgeon: Jerene Bears, MD;  Location: [facility];  Service: Endoscopy;  Laterality: N/A;  . Eus N/A 03/31/2015    Procedure: UPPER ENDOSCOPIC ULTRASOUND (EUS) RADIAL;  Surgeon: Milus Banister, MD;  Location: [facility] ENDOS[facility];  Service:  Endoscopy;  Laterality: N/A;  . Cataract extraction Bilateral     Family History  Problem Relation Age of Onset  . Diabetes Mother   . Hypertension Mother   . Stroke Mother   . Heart disease Mother   . Hypertension Father   . Diabetes Sister   . Cancer Sister     THYROID  . Cancer Brother     LUNG  . Diabetes Daughter   . Diabetes Son     History   Social History  . Marital Status: Widowed    Spouse Name: N/A  . Number of Children: N/A  . Years of Education: N/A   Occupational History  . Not on file.   Social History Main Topics  . Smoking status: Former Smoker -- 1.00 packs/day for 20 years    Quit date: 03/02/2015  . Smokeless tobacco: Not on file     Comment: 3 weeks- < 1ppd.  . Alcohol Use: No  . Drug Use: No  . Sexual Activity: Not on file   Other Topics Concern  . Not on file   Social History Narrative    History  Smoking status  . Former Smoker -- 1.00 packs/day for 20 years  . Quit date: 03/02/2015  Smokeless tobacco  . Not on file    Comment: 3 weeks- < 1ppd.    History  Alcohol Use No     No Known Allergies  Current Facility-Administered Medications  Medication Dose Route Frequency Provider Last Rate Last Dose  . cefUROXime (ZINACEF) 1.5 g in dextrose 5 % 50 mL IVPB  1.5 g Intravenous 60 min Pre-Op Grace Isaac, MD      . mupirocin ointment (BACTROBAN) 2 %              Review of Systems:     Cardiac Review of Systems: Y or N  Chest Pain [  y  ]  Resting SOB [ n  ] Exertional SOB  [ y ]  Orthopnea [  ]   Pedal Edema [   ]    Palpitations [  ] Syncope  [  ]   Presyncope [   ]  General Review of Systems: [Y] = yes [  ]=no Constitional: recent weight change [ gained wt ];  Wt loss over the last 3 months [ ]  anorexia [  ]; fatigue [ y ]; nausea [ y ]; night  sweats [  ]; fever [  ]; or chills [  ];          Dental: poor dentition[ n ]; Last Dentist visit:   Eye : blurred vision [  ]; diplopia [   ]; vision changes [  ];  Amaurosis  fugax[  ]; Resp: cough [  ];  wheezing[  ];  hemoptysis[ n ]; shortness of breath[ n ]; paroxysmal nocturnal dyspnea[  ]; dyspnea on exertion[ y ]; or orthopnea[  ];  GI:  gallstones[  ], vomiting[n  ];  dysphagia[y  ]; melena[  ];  hematochezia [ y ]; heartburn[ y ];   Hx of  Colonoscopy[  ]; GU: kidney stones [  ]; hematuria[  ];   dysuria [  ];  nocturia[  ];  history of     obstruction [  ]; urinary frequency [  ]             Skin: rash, swelling[  ];, hair loss[  ];  peripheral edema[  ];  or itching[  ]; Musculosketetal: myalgias[  ];  joint swelling[  ];  joint erythema[  ];  joint pain[  ];  back pain[  ];  Heme/Lymph: bruising[  ];  bleeding[n  ];  anemia[  ];  Neuro: TIA[  ];  headaches[  ];  stroke[  ];  vertigo[  ];  seizures[  ];   paresthesias[  ];  difficulty walking[ n ];  Psych:depression[  ]; anxiety[ y ];  Endocrine: diabetes[  ];  thyroid dysfunction[  ];  Immunizations: Flu up to date [ n ]; Pneumococcal up to date Fl[facility]ncio[employer]rier  ];  Other:  Physical Exam: BP 156/93 mmHg  Pulse 63  Temp(Src) 98 F (36.7 C) (Oral)  Resp 20  Ht 5\' 2"  (1.575 m)  Wt 157 lb (71.215 kg)  BMI 28.71 kg/m2  SpO2 100%   Wt Readings from Last 3 Encounters:  05/10/15 157 lb (71.215 kg)  05/06/15 157 lb 11.2 oz (71.532 kg)  04/28/15 156 lb (70.761 kg)   weight up from 132  PHYSICAL EXAMINATION: BP 156/93 mmHg  Pulse 63  Temp(Src) 98 F (36.7 C) (Oral)  Resp 20  Ht 5\' 2"  (1.575 m)  Wt 157 lb (71.215 kg)  BMI 28.71 kg/m2  SpO2 100%   General appearance: alert, cooperative, appears older than stated age, cachectic and fatigued Neurologic: intact Heart: Appears to be in a regular rhythm today Lungs: diminished breath sounds bibasilar Abdomen: Abdomen mildly distended with intact G-tube in the left upper abdomen Extremities: extremities normal, atraumatic, no cyanosis or edema and Homans sign is negative, no sign of DVT Wound: There appears to be no infection around the I do not appreciate  cervical or supraclavicular adenopathy PEG tube has been removed and site well healed  Diagnostic Studies & Laboratory data:     Recent Radiology Findings:  Dg Chest 2 View  05/06/2015   CLINICAL DATA:  Preoperative examination prior to surgery for esophageal malignancy ; history of pulmonary embolism and diabetes, discontinued smoking 2 months ago.  EXAM: CHEST  2 VIEW  COMPARISON:  PA and lateral chest x-ray of March 30, 2015  FINDINGS: The lungs are adequately inflated. The interstitial markings are coarse bilaterally but are stable. There are no pulmonary parenchymal nodules or masses nor alveolar infiltrates. The heart and pulmonary vascularity are normal. The mediastinum is normal in width. There is no pleural effusion. There is moderate multilevel degenerative disc disease of  the lower thoracic spine. The patient has undergone posterior lumbar fusion.  IMPRESSION: Chronically increased interstitial markings are consistent with the patient's smoking history. There is no active cardiopulmonary disease.   Electronically Signed   By: David  Martinique M.D.   On: 05/06/2015 09:18   Dg Chest 2 View  03/31/2015   CLINICAL DATA:  69 year old female with a history of cough  EXAM: CHEST - 2 VIEW  COMPARISON:  PET-CT 03/24/2015, plain film 03/08/2015, chest CT 03/07/2015  FINDINGS: Cardiomediastinal silhouette is unchanged in size and contour.  No pulmonary vascular congestion.  Low lung volumes. No confluent airspace disease or pneumothorax. Similar appearance of interstitial opacities compared to prior plain films. No pleural effusion.  No displaced fracture. Multilevel degenerative changes of the thoracic spine.  Bilateral cervical ribs.  Unremarkable appearance of the upper abdomen.  IMPRESSION: No radiographic evidence of acute cardiopulmonary disease.  Signed,  Dulcy Fanny. Earleen Newport, DO  [facility]  [facility]   Electronically Signed   By: Corrie Mckusick D.O.   On:  03/31/2015 08:25   Nm Pet Image Restag (ps) Skull Base To Thigh  03/24/2015   CLINICAL DATA:  Subsequent treatment strategy for esophageal cancer.  EXAM: NUCLEAR MEDICINE PET SKULL BASE TO THIGH  TECHNIQUE: 7.4 mCi F-18 FDG was injected intravenously. Full-ring PET imaging was performed from the skull base to thigh after the radiotracer. CT data was obtained and used for attenuation correction and anatomic localization.  FASTING BLOOD GLUCOSE:  Value: 219 mg/dl  COMPARISON:  CTs of the neck, abdomen and pelvis 03/07/2015. Chest CT 11/29/2014.  FINDINGS: NECK  No hypermetabolic cervical lymph nodes are identified.There are no lesions of the pharyngeal mucosal space. There is low-level activity associated with the muscles of phonation, within physiologic limits.  CHEST  There are no hypermetabolic mediastinal, hilar or axillary lymph nodes. There is focal hypermetabolic activity at the gastroesophageal junction, extending over a short segment. This has an SUV max of 5.5. Underlying wall thickening in this region appears grossly stable without well-defined mass. There is no suspicious pulmonary activity or suspicious pulmonary nodule. Mild emphysema, basilar interstitial prominence and small pleural effusions are noted.  ABDOMEN/PELVIS  There is no hypermetabolic activity within the liver, adrenal glands, spleen or pancreas. There is no hypermetabolic nodal activity. Pancreatic atrophy and aortoiliac atherosclerosis noted.  SKELETON  There is no hypermetabolic activity to suggest osseous metastatic disease. There are postsurgical changes within the lumbar spine.  IMPRESSION: 1. Nonspecific short segment hypermetabolic activity at the gastroesophageal junction may be related to treated tumor and/or radiation change. 2. No evidence of metastatic disease. There is no abnormal activity within the liver or adjacent lymph nodes.   Electronically Signed   By: Richardean Sale M.D.   On: 03/24/2015 10:20  I have  independently reviewed the above radiology studies  and reviewed the findings with the patient.  Final Report  03/07/2015  CLINICAL DATA: Esophageal cancer. Chemotherapy and radiation therapy completed August 2015.  EXAM: CT ABDOMEN AND PELVIS WITH CONTRAST  TECHNIQUE: Multidetector CT imaging of the abdomen and pelvis was performed using the standard protocol following bolus administration of intravenous contrast.  CONTRAST: 100 mL Isovue  COMPARISON: PET-CT scan 04/21/2014, CT scan 11/16/2014  FINDINGS: Lower chest: Lung bases are clear.  Hepatobiliary: No focal hepatic lesion. The gallbladder is collapsed.  Pancreas: Pancreas is normal. No ductal dilatation. No pancreatic inflammation.  Spleen: Normal spleen  Adrenals/urinary tract: Mild nodularity of adrenal glands is unchanged. Kidneys  are normal. The ureters and bladder normal.  Stomach/Bowel: There is thickening of the distal esophagus similar to prior. Stomach, small bowel, and colon are unremarkable.  Vascular/Lymphatic: Atherosclerotic calcification aorta. No periportal retroperitoneal lymphadenopathy. No gastrohepatic ligament lymphadenopathy. No pelvic adenopathy or inguinal adenopathy.  Reproductive: Post hysterectomy.  Musculoskeletal: No aggressive osseous lesion. Posterior lumbar fusion  Other: No peritoneal metastasis.  IMPRESSION: 1. No evidence of esophageal cancer recurrence or metastasis in the abdomen pelvis. 2. Stable thickening distal esophagus likely relates radiation change. 3. Atherosclerotic calcification of the aorta.   Electronically Signed By: Suzy Bouchard M.D. On: 03/07/2015 16:43    CLINICAL DATA: Shortness of breath; history of esophageal malignancy, PEG tube placement, appendectomy, hysterectomy, and lumbar fusion.  EXAM: CT ANGIOGRAPHY CHEST  CT ABDOMEN AND PELVIS WITH CONTRAST  TECHNIQUE: Multidetector CT imaging of the chest was performed using  the standard protocol during bolus administration of intravenous contrast. Multiplanar CT image reconstructions and MIPs were obtained to evaluate the vascular anatomy. Multidetector CT imaging of the abdomen and pelvis was performed using the standard protocol during bolus administration of intravenous contrast.  CONTRAST: 100 cc of Isovue 370 intravenously. The patient also received oral contrast material through the PEG tube.  COMPARISON: Portable chest x-ray of today's date and PET-CT study of April 21, 2014 and CT scan of the chest and abdomen abdomen dated March 31, 2014  FINDINGS: CTA CHEST FINDINGS  There are small filling defects within peripheral pulmonary arterial branches to the right lower lobe. There are no filling defects elsewhere on the right nor on the left. The caliber of the thoracic aorta is normal. The cardiac chambers are normal in size. The RV LV ratio is approximately 1. There is no pericardial effusion. There is no lymphadenopathy. There is thickening of the wall of the lower 1/3 of the esophagus without visible discrete mass. There is no free mediastinal fluid or air.  At lung window settings there is no pulmonary parenchymal mass. There is increased density in the posterior medial aspect of the right costophrenic gutter which may reflect pneumonia or pulmonary infarction new since the previous studies. There is no pleural effusion nor pulmonary parenchymal mass.  There are degenerative changes of the lower thoracic discs. There is no compression fracture. The sternum and observed ribs exhibit no acute abnormalities.  CT ABDOMEN and PELVIS FINDINGS  The liver, gallbladder, pancreas, spleen, right adrenal gland, and kidneys are normal. There is stable mild enlargement of the left adrenal gland. There is no bulky periaortic or pericaval lymphadenopathy. The abdominal aorta exhibits mural thrombus and calcified plaque, but no evidence of aneurysm. The  stomach contains a PEG tube and is of partially distended with gas. The GE junction region remains prominent. No perigastric lymphadenopathy is demonstrated. The small bowel is normal. There is a moderate stool burden within the colon without evidence of obstruction. The rectum is mildly distended with gas and stool. The urinary bladder is unremarkable. The uterus is surgically absent. The patient has undergone previous posterior fusion at L3-4 and L4-5. The vertebral bodies are preserved in height. The bony pelvis is unremarkable.  Review of the MIP images confirms the above findings.  IMPRESSION: 1. There are small emboli within branches of the right lower lobe pulmonary artery posteriorly without evidence of significant right heart strain. There is small amount of adjacent presumed infarct versus pneumonia. 2. There is mild thickening of the wall of the distal third of the esophagus without evidence of a discrete mass or perforation.  3. There is no evidence of CHF nor pulmonary parenchymal masses. There is no pleural effusion. 4. No acute intra-abdominal abnormality is demonstrated. There are atherosclerotic changes of the abdominal aorta without evidence of dissection or aneurysm. There is stable mild enlargement of the left adrenal gland. 5. These results were called by telephone at the time of interpretation on 07/21/2014 at 12:59 pm to Dr. Isla Pence MD, who verbally acknowledged these results.   Electronically Signed By: David Martinique On: 07/21/2014 12:59   CLINICAL DATA: Initial treatment strategy for esophageal carcinoma.  EXAM: NUCLEAR MEDICINE PET SKULL BASE TO THIGH  TECHNIQUE: 11.0 mCi F-18 FDG was injected intravenously. Full-ring PET imaging was performed from the skull base to thigh after the radiotracer. CT data was obtained and used for attenuation correction and anatomic localization.  FASTING BLOOD GLUCOSE: Value: 73 mg/dl  COMPARISON: CT  03/31/2014  FINDINGS: NECK  No hypermetabolic lymph nodes in the neck.  CHEST  There is a long segment of intense hypermetabolic activity associated with the esophagus. This extends from relate just superior to the carina through the GE junction with SUV max equal 8.5. There is a hypermetabolic mass at the esophageal junction extending to the gastric cardia with SUV max equals 17.3. No hypermetabolic mediastinal lymph nodes. No suspicious pulmonary nodules.  ABDOMEN/PELVIS  Hypermetabolic mass in the gastric cardia described above. There is no hypermetabolic gastro hepatic ligament lymph nodes. No abnormal metabolic activity within the liver.  No hypermetabolic abdominal pelvic lymph nodes  SKELETON  No focal hypermetabolic activity to suggest skeletal metastasis.  IMPRESSION: 1. Hypermetabolic mass in the gastric cardia and esophageal junction consists with primary esophageal / gastric carcinoma. 2. Long segment intense metabolic activity associated with the mid and distal esophagus. Differential includes esophageal carcinoma versus esophagitis. 3. No evidence of hypermetabolic mediastinal or gastrohepatic ligament lymph nodes. 4. No evidence of liver metastasis. 5. No distant metastasis   Electronically Signed By: Suzy Bouchard M.D. On: 04/21/2014 13:57    CLINICAL DATA: Difficulty swallowing and epigastric pain. Esophageal disorder. Prior appendectomy and hysterectomy.  EXAM: CT CHEST AND ABDOMEN WITH CONTRAST  TECHNIQUE: Multidetector CT imaging of the chest and abdomen was performed following the standard protocol during bolus administration of intravenous contrast.  CONTRAST: 80 cc Isovue 370  COMPARISON: Abdominal pelvic CT 02/10/2014. Chest radiograph 04/07/2013. Chest CT 04/23/2008.  FINDINGS: CT CHEST FINDINGS  Lungs/Pleura: No nodules or airspace opacities. No pleural fluid.  Heart/Mediastinum: No supraclavicular adenopathy. Mildly  age advanced aortic and branch vessel atherosclerosis. Normal heart size with lipomatous hypertrophy of the interatrial septum. Multivessel coronary artery atherosclerosis. No mediastinal or hilar adenopathy. Air contrast level in the thoracic esophagus on image 28 of series 2 and more superiorly on image 19/series 2.  CT ABDOMEN FINDINGS  Abdomen: Normal liver, spleen. Redemonstration of soft tissue fullness involving the gastroesophageal junction and proximal stomach. Example images 37-42 of series 2. Suspect fluid/gastric contents with soft tissue density in the gastric cardia/body junction on image 39/series 2.  Normal pancreas, gallbladder, biliary tract. Mild right adrenal thickening and left adrenal nodularity are grossly similar back to 2009. Suspect an underlying left adrenal adenoma at 1.3 cm. 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 Normal right kidney. Advanced aortic and branch vessel atherosclerosis. Ulcerative plaque within the infrarenal aorta, including on image 62/series 2. Beam hardening artifact from spinal hardware. No retroperitoneal or retrocrural adenopathy. Normal colon and terminal ileum. Normal abdominal small bowel without ascites. No evidence of omental or peritoneal disease.  Bones/Musculoskeletal: L3-5 lumbar spine fixation. Lower thoracic degenerative disc disease.  IMPRESSION: CT CHEST IMPRESSION  1. No acute process in the chest. 2. Age advanced coronary artery atherosclerosis. Recommend assessment of coronary risk factors and consideration of medical therapy. 3. Mildly dilated thoracic esophagus with contrast within. This suggests a component of esophageal obstruction, dysmotility, or gastroesophageal reflux disease.  CT ABDOMEN AND PELVIS IMPRESSION  1. Redemonstration of soft tissue fullness at the distal esophagus and proximal stomach. Cannot exclude gastritis or gastroesophageal carcinoma. If not already performed,  endoscopy is recommended. 2. Advanced abdominal aortic and branch vessel atherosclerosis.   Electronically Signed By: Abigail Miyamoto M.D. On: 03/31/2014 14:14   Pretreatment EUS: Endoscopic findings: 1. Malignant mass in distal esophagus. The mass was circumferential, patially obstructing but I was able to fairly easily advance echoendoscope through the strictured lumen. The proximal edge was 34 cm from the incisors and distal edge (just below the GE junction) was at 39 cm. The mass is 5cm long. EUS findings: 1. The mass above corresponded with a hypoechoic, heterogeneous mass that clearly passes into and through the muscularis propria layer of the esophageal wall (uT3). 2. There were two round, well demarcated, 5-65mm, hypoechoic paraesophageal lymphnodes that lay directly adjacent to the mass that are suspicious for malignant involvement (uN1). 3. No celiac adenopathy. Impression: uT3N1 (clinical stage IIIa) 5cm long, cirumferential GE junction adenocarcinoma with proximal edge at 34cm from incisors and distal edge just below the GE junction.   Post Treatment EUS: Endoscopic findings: 1. The previously noted (2015) GE junction malignancy was smaller now, but still clearly present. It was 2-3cm long, non-circumferential, ulcerated, located at GE junction (37cm from incisors). EUS findings: 1. The mass above correlated with a hypoechoic, heterogeneous lesion that clearly passed into and through the muscularis propria layer of the distal esophagus, GE junction wall (uT3). 2. The was no paraesophageal, mediastinal, celiac adenopathy (uN0). ENDOSCOPIC IMPRESSION: uT3N0 (stage IIa) non-circumferential, 2-3cm long, ulcerated GE junction adenocarcinoma. This appears to have responded to neoadjuvant chemo (was previously staged IIIa) and so she may be a candidate for surgical resection   Recent Lab Findings: Lab Results  Component Value Date   WBC 11.9* 05/06/2015   HGB 13.3 05/06/2015     HCT 39.0 05/06/2015   PLT 209 05/06/2015   GLUCOSE 116* 05/06/2015   ALT 14 05/06/2015   AST 21 05/06/2015   NA 136 05/06/2015   K 4.2 05/06/2015   CL 107 05/06/2015   CREATININE 1.14* 05/06/2015   BUN 15 05/06/2015   CO2 21* 05/06/2015   INR 1.04 05/06/2015   HGBA1C 13.3* 11/30/2014   PFT's  FEV1 1.00 98%  DLCO  16.24  80%  Assessment / Plan:   At this point with recurrent local disease I have recommended to her that we proceed with  surgical resection . Appears  to be downstage to clinical stage IIA.  I discussed in detail with the patient and her daughter the risks and options involved and she is willing to proceed.   I had a detailed discussion with Casilda Carls regarding the magnitude of the surgical esophagectomy procedure as well as the risks, the expected benefits, and alternatives.  I quoted Casilda Carls 5% perioperative mortality rate and a complication rate as high as 40%.  We specifically discussed complications, which include, but were not limited to: recurrent nerve injury with possible permanent hoarseness, anastomotic leak, airway and great vessel injury, conduit ischemia, thoracic duct leak, the inability to complete  the operation via a transhiatal approach requiring a right thoracotomy,  bleeding, need for blood transfusion and the potential need for ventilator support.  SHARMEL BALLANTINE has had questions answered is well informed and willing to proceed.    Grace Isaac MD      [address] [place] [address] Office [phone]   Beeper [phone]  05/10/2015 7:06 AM

## 2015-05-10 NOTE — Brief Op Note (Addendum)
      [address]       [place] [address]             [phone]      05/10/2015  4:17 PM  PATIENT:  Casilda Carls  69 y.o. female  PRE-OPERATIVE DIAGNOSIS:  ESOPHAGEAL CANCER  POST-OPERATIVE DIAGNOSIS:  same  PROCEDURE:  Procedure(s):   VIDEO BRONCHOSCOPY (N/A) TRANSHIATAL TOTAL ESOPHAGEAL RESECTION (N/A) FEEDING JEJUNOSTOMY (N/A) pyloroplasty   SURGEON:  Surgeon(s) and Role:    * Grace Isaac, MD - Primary  PHYSICIAN ASSISTANT: Erin Barrett PA-C  ANESTHESIA:   general  EBL:  Total I/O In: 3500 [I.V.:3500] Out: 1800 [Urine:1300; Blood:500]  BLOOD ADMINISTERED:none  DRAINS: Right and Left Pleural Chest Tubes, J Tube, Penrose left neck   LOCAL MEDICATIONS USED:  NONE  SPECIMEN:  Source of Specimen:  Esophagus, Para Esophageal Lymph Node  DISPOSITION OF SPECIMEN:  PATHOLOGY  COUNTS:  YES   DICTATION: .Dragon Dictation  PLAN OF CARE: Admit to inpatient   PATIENT DISPOSITION:  ICU - intubated and hemodynamically stable.   Delay start of Pharmacological VTE agent (>24hrs) due to surgical blood loss or risk of bleeding: yes

## 2015-05-10 NOTE — Progress Notes (Signed)
Delayed ventilator weaning d/t decreased pt temp of 93.9F. Warming blanket applied, will continue to monitor for readiness to wean vent.

## 2015-05-10 NOTE — Anesthesia Postprocedure Evaluation (Signed)
  Anesthesia Post-op Note  Patient: Sharon Price  Procedure(s) Performed: Procedure(s): VIDEO BRONCHOSCOPY (N/A) TRANSHIATAL TOTAL ESOPHAGEAL RESECTION (N/A) FEEDING JEJUNOSTOMY (N/A)  Patient Location: SICU  Anesthesia Type:General  Level of Consciousness: sedated and Patient remains intubated per anesthesia plan  Airway and Oxygen Therapy: Patient remains intubated per anesthesia plan and Patient placed on Ventilator (see vital sign flow sheet for setting)  Post-op Pain: none  Post-op Assessment: Post-op Vital signs reviewed, Patient's Cardiovascular Status Stable, Respiratory Function Stable, Patent Airway and Pain level controlled              Post-op Vital Signs: stable  Last Vitals:  Filed Vitals:   05/10/15 1705  BP: 133/59  Pulse: 74  Temp:   Resp: 12    Complications: No apparent anesthesia complications

## 2015-05-10 NOTE — Progress Notes (Signed)
Dr. Darcey Nora was updated via telephone on pt status, low UOP and need for low-does neo. Orders received for CVP monitoring, 5% albumin, and ABG. Will implement and continue to monitor.

## 2015-05-10 NOTE — Progress Notes (Signed)
First part of rapid wean protocol failed due to increased respiratory rate (30-35bpm). Will attempt again in 1 hour. RT will continue to monitor.

## 2015-05-10 NOTE — Anesthesia Procedure Notes (Addendum)
Procedure Name: Intubation Date/Time: 05/10/2015 7:53 AM Performed by: Jacquiline Doe A Pre-anesthesia Checklist: Patient identified, Timeout performed, Emergency Drugs available, Suction available and Patient being monitored Patient Re-evaluated:Patient Re-evaluated prior to inductionOxygen Delivery Method: Circle system utilized Preoxygenation: Pre-oxygenation with 100% oxygen Intubation Type: IV induction and Cricoid Pressure applied Ventilation: Mask ventilation without difficulty and Oral airway inserted - appropriate to patient size Laryngoscope Size: Mac and 3 Grade View: Grade I Tube type: Oral (NIM OETT .) Tube size: 8.0 mm Number of attempts: 1 Airway Equipment and Method: Stylet Placement Confirmation: ETT inserted through vocal cords under direct vision,  breath sounds checked- equal and bilateral and positive ETCO2 Secured at: 22 cm Tube secured with: Tape Dental Injury: Teeth and Oropharynx as per pre-operative assessment    Procedure Name: Intubation Date/Time: 05/10/2015 4:40 PM Performed by: Jacquiline Doe A Pre-anesthesia Checklist: Patient identified, Suction available, Patient being monitored, Timeout performed and Emergency Drugs available Patient Re-evaluated:Patient Re-evaluated prior to inductionOxygen Delivery Method: Circle system utilized Preoxygenation: Pre-oxygenation with 100% oxygen Intubation Type: IV induction and Cricoid Pressure applied Laryngoscope Size: Mac and 3 Grade View: Grade I Tube type: Subglottic suction tube Tube size: 7.5 mm Number of attempts: 1 Airway Equipment and Method: Video-laryngoscopy and Bougie stylet Placement Confirmation: ETT inserted through vocal cords under direct vision,  positive ETCO2 and breath sounds checked- equal and bilateral Secured at: 21 cm Tube secured with: Tape Dental Injury: Teeth and Oropharynx as per pre-operative assessment  Comments: Direct vision with #3 MAC video glydescope ,  bougie tube changer passed  through OETT , Pt extubated and re-intubated , under direct vision , NIM tube replaced with #7.5 Subglottic tube .VSS . Positive ETCO2 . BBSE check , per Dr. Linna Caprice .No apparent complications .      The patient was identified and consent obtained.  TO was performed, and full barrier precautions were used.  The skin was anesthetized with lidocaine.  Once the vein was located with the 22 ga., the wire was inserted into the vein. The insertion site was dilated and the CVL was carefully inserted and sutured in place. The patient tolerated the procedure well.  CXR was ordered for PACU.Ultrasound guidance: relevant anatomy identified, needle position confirmed, vessel patent, wire seen inside the vessel.  Images printed for the medical record.  Start: 0703 End: [place] J. Tedra Senegal, MD

## 2015-05-11 ENCOUNTER — Encounter (HOSPITAL_COMMUNITY): Payer: Self-pay | Admitting: Cardiothoracic Surgery

## 2015-05-11 ENCOUNTER — Inpatient Hospital Stay (HOSPITAL_COMMUNITY): Payer: Commercial Managed Care - PPO

## 2015-05-11 LAB — BASIC METABOLIC PANEL
Anion gap: 9 (ref 5–15)
Anion gap: 10 (ref 5–15)
BUN: 13 mg/dL (ref 6–20)
BUN: 16 mg/dL (ref 6–20)
CALCIUM: 9 mg/dL (ref 8.9–10.3)
CHLORIDE: 104 mmol/L (ref 101–111)
CO2: 21 mmol/L — ABNORMAL LOW (ref 22–32)
CO2: 18 mmol/L — ABNORMAL LOW (ref 22–32)
CREATININE: 1.44 mg/dL — AB (ref 0.44–1.00)
Calcium: 8.2 mg/dL — ABNORMAL LOW (ref 8.9–10.3)
Chloride: 102 mmol/L (ref 101–111)
Creatinine, Ser: 1.64 mg/dL — ABNORMAL HIGH (ref 0.44–1.00)
GFR calc Af Amer: 42 mL/min — ABNORMAL LOW (ref >60)
GFR calc Af Amer: 36 mL/min — ABNORMAL LOW (ref >60)
GFR calc non Af Amer: 36 mL/min — ABNORMAL LOW (ref >60)
GFR calc non Af Amer: 31 mL/min — ABNORMAL LOW (ref >60)
Glucose, Bld: 134 mg/dL — ABNORMAL HIGH (ref 65–99)
Glucose, Bld: 189 mg/dL — ABNORMAL HIGH (ref 65–99)
POTASSIUM: 4.2 mmol/L (ref 3.5–5.1)
Potassium: 4.7 mmol/L (ref 3.5–5.1)
SODIUM: 134 mmol/L — AB (ref 135–145)
Sodium: 130 mmol/L — ABNORMAL LOW (ref 135–145)

## 2015-05-11 LAB — CBC
HCT: 35.8 % — ABNORMAL LOW (ref 36.0–46.0)
HEMATOCRIT: 38.2 % (ref 36.0–46.0)
Hemoglobin: 13.1 g/dL (ref 12.0–15.0)
Hemoglobin: 12.3 g/dL (ref 12.0–15.0)
MCH: 30 pg (ref 26.0–34.0)
MCH: 30 pg (ref 26.0–34.0)
MCHC: 34.3 g/dL (ref 30.0–36.0)
MCHC: 34.4 g/dL (ref 30.0–36.0)
MCV: 87.6 fL (ref 78.0–100.0)
MCV: 87.3 fL (ref 78.0–100.0)
Platelets: 155 10*3/uL (ref 150–400)
Platelets: 141 10*3/uL — ABNORMAL LOW (ref 150–400)
RBC: 4.36 MIL/uL (ref 3.87–5.11)
RBC: 4.1 MIL/uL (ref 3.87–5.11)
RDW: 13.8 % (ref 11.5–15.5)
RDW: 14.1 % (ref 11.5–15.5)
WBC: 11.6 10*3/uL — AB (ref 4.0–10.5)
WBC: 18.7 10*3/uL — ABNORMAL HIGH (ref 4.0–10.5)

## 2015-05-11 LAB — GLUCOSE, CAPILLARY
Glucose-Capillary: 100 mg/dL — ABNORMAL HIGH (ref 65–99)
Glucose-Capillary: 109 mg/dL — ABNORMAL HIGH (ref 65–99)
Glucose-Capillary: 111 mg/dL — ABNORMAL HIGH (ref 65–99)
Glucose-Capillary: 113 mg/dL — ABNORMAL HIGH (ref 65–99)
Glucose-Capillary: 118 mg/dL — ABNORMAL HIGH (ref 65–99)
Glucose-Capillary: 124 mg/dL — ABNORMAL HIGH (ref 65–99)
Glucose-Capillary: 129 mg/dL — ABNORMAL HIGH (ref 65–99)
Glucose-Capillary: 97 mg/dL (ref 65–99)
Glucose-Capillary: 100 mg/dL — ABNORMAL HIGH (ref 65–99)
Glucose-Capillary: 119 mg/dL — ABNORMAL HIGH (ref 65–99)
Glucose-Capillary: 122 mg/dL — ABNORMAL HIGH (ref 65–99)
Glucose-Capillary: 122 mg/dL — ABNORMAL HIGH (ref 65–99)
Glucose-Capillary: 125 mg/dL — ABNORMAL HIGH (ref 65–99)
Glucose-Capillary: 130 mg/dL — ABNORMAL HIGH (ref 65–99)
Glucose-Capillary: 165 mg/dL — ABNORMAL HIGH (ref 65–99)
Glucose-Capillary: 179 mg/dL — ABNORMAL HIGH (ref 65–99)
Glucose-Capillary: 48 mg/dL — ABNORMAL LOW (ref 65–99)
Glucose-Capillary: 72 mg/dL (ref 65–99)
Glucose-Capillary: 92 mg/dL (ref 65–99)

## 2015-05-11 LAB — TYPE AND SCREEN
ABO/RH(D): A POS
Antibody Screen: NEGATIVE
Unit division: 0
Unit division: 0
Unit division: 0
Unit division: 0

## 2015-05-11 LAB — POCT I-STAT 3, ART BLOOD GAS (G3+)
Acid-base deficit: 5 mmol/L — ABNORMAL HIGH (ref 0.0–2.0)
Acid-base deficit: 4 mmol/L — ABNORMAL HIGH (ref 0.0–2.0)
Bicarbonate: 20.4 mEq/L (ref 20.0–24.0)
Bicarbonate: 20.8 mEq/L (ref 20.0–24.0)
O2 Saturation: 94 %
O2 Saturation: 94 %
Patient temperature: 98.5
Patient temperature: 98.5
TCO2: 22 mmol/L (ref 0–100)
TCO2: 22 mmol/L (ref 0–100)
pCO2 arterial: 36.6 mmHg (ref 35.0–45.0)
pCO2 arterial: 37.5 mmHg (ref 35.0–45.0)
pH, Arterial: 7.355 (ref 7.350–7.450)
pH, Arterial: 7.353 (ref 7.350–7.450)
pO2, Arterial: 73 mmHg — ABNORMAL LOW (ref 80.0–100.0)
pO2, Arterial: 72 mmHg — ABNORMAL LOW (ref 80.0–100.0)

## 2015-05-11 LAB — BLOOD PRODUCT ORDER (VERBAL) VERIFICATION

## 2015-05-11 LAB — HEMOGLOBIN A1C
Hgb A1c MFr Bld: 8 % — ABNORMAL HIGH (ref 4.8–5.6)
Mean Plasma Glucose: 183 mg/dL

## 2015-05-11 MED ORDER — DOPAMINE-DEXTROSE 3.2-5 MG/ML-% IV SOLN
3.0000 ug/kg/min | INTRAVENOUS | Status: DC
  Administered 2015-05-11: 3 ug/kg/min via INTRAVENOUS
  Filled 2015-05-11: qty 250

## 2015-05-11 MED ORDER — SODIUM CHLORIDE 0.9 % IV SOLN: INTRAVENOUS | Status: DC | PRN

## 2015-05-11 MED ORDER — FENTANYL CITRATE (PF) 100 MCG/2ML IJ SOLN
50.0000 ug | INTRAMUSCULAR | Status: DC | PRN
  Administered 2015-05-11 (×2): 50 ug via INTRAVENOUS
  Filled 2015-05-11 (×3): qty 2

## 2015-05-11 MED ORDER — DEXTROSE 50 % IV SOLN
21.0000 mL | Freq: Once | INTRAVENOUS | Status: AC
  Administered 2015-05-11: 21 mL via INTRAVENOUS

## 2015-05-11 MED ORDER — FUROSEMIDE 10 MG/ML IJ SOLN
20.0000 mg | Freq: Once | INTRAMUSCULAR | Status: AC
  Administered 2015-05-11: 20 mg via INTRAVENOUS
  Filled 2015-05-11: qty 2

## 2015-05-11 MED ORDER — DEXTROSE 50 % IV SOLN
INTRAVENOUS | Status: AC
  Filled 2015-05-11: qty 50

## 2015-05-11 MED ORDER — LACTATED RINGERS IV SOLN: INTRAVENOUS | Status: DC

## 2015-05-11 MED ORDER — ALBUMIN HUMAN 5 % IV SOLN
12.5000 g | Freq: Once | INTRAVENOUS | Status: AC
  Administered 2015-05-11: 12.5 g via INTRAVENOUS

## 2015-05-11 MED ORDER — FUROSEMIDE 10 MG/ML IJ SOLN
40.0000 mg | Freq: Once | INTRAMUSCULAR | Status: AC
  Administered 2015-05-11: 40 mg via INTRAVENOUS
  Filled 2015-05-11: qty 4

## 2015-05-11 MED ORDER — SODIUM CHLORIDE 0.9 % IV BOLUS (SEPSIS)
500.0000 mL | Freq: Once | INTRAVENOUS | Status: AC
  Administered 2015-05-11: 500 mL via INTRAVENOUS

## 2015-05-11 MED ORDER — ENOXAPARIN SODIUM 30 MG/0.3ML ~~LOC~~ SOLN
30.0000 mg | SUBCUTANEOUS | Status: DC
Start: 2015-05-11 — End: 2015-05-16
  Administered 2015-05-11 – 2015-05-15 (×5): 30 mg via SUBCUTANEOUS
  Filled 2015-05-11 (×7): qty 0.3

## 2015-05-11 MED ORDER — LEVALBUTEROL HCL 0.63 MG/3ML IN NEBU
0.6300 mg | INHALATION_SOLUTION | Freq: Four times a day (QID) | RESPIRATORY_TRACT | Status: DC
  Administered 2015-05-11 – 2015-05-16 (×20): 0.63 mg via RESPIRATORY_TRACT
  Filled 2015-05-11 (×34): qty 3

## 2015-05-11 MED ORDER — INSULIN ASPART 100 UNIT/ML ~~LOC~~ SOLN
0.0000 [IU] | SUBCUTANEOUS | Status: DC
  Administered 2015-05-11 – 2015-05-12 (×5): 4 [IU] via SUBCUTANEOUS
  Administered 2015-05-12: 2 [IU] via SUBCUTANEOUS
  Administered 2015-05-12 – 2015-05-13 (×2): 4 [IU] via SUBCUTANEOUS
  Administered 2015-05-13 (×2): 2 [IU] via SUBCUTANEOUS
  Administered 2015-05-13: 4 [IU] via SUBCUTANEOUS
  Administered 2015-05-13 – 2015-05-14 (×3): 2 [IU] via SUBCUTANEOUS
  Administered 2015-05-14: 4 [IU] via SUBCUTANEOUS
  Administered 2015-05-14 – 2015-05-15 (×3): 2 [IU] via SUBCUTANEOUS
  Administered 2015-05-15: 4 [IU] via SUBCUTANEOUS
  Administered 2015-05-15 – 2015-05-16 (×5): 2 [IU] via SUBCUTANEOUS
  Administered 2015-05-16 (×3): 4 [IU] via SUBCUTANEOUS
  Administered 2015-05-17 (×2): 2 [IU] via SUBCUTANEOUS
  Administered 2015-05-17 (×2): 4 [IU] via SUBCUTANEOUS
  Administered 2015-05-17: 8 [IU] via SUBCUTANEOUS
  Administered 2015-05-18: 2 [IU] via SUBCUTANEOUS
  Administered 2015-05-18: 4 [IU] via SUBCUTANEOUS
  Administered 2015-05-18 (×3): 8 [IU] via SUBCUTANEOUS
  Administered 2015-05-18: 2 [IU] via SUBCUTANEOUS
  Administered 2015-05-19: 8 [IU] via SUBCUTANEOUS
  Administered 2015-05-19 (×2): 4 [IU] via SUBCUTANEOUS
  Administered 2015-05-19: 2 [IU] via SUBCUTANEOUS
  Administered 2015-05-19: 12 [IU] via SUBCUTANEOUS
  Administered 2015-05-19: 8 [IU] via SUBCUTANEOUS
  Administered 2015-05-20: 4 [IU] via SUBCUTANEOUS
  Administered 2015-05-20: 8 [IU] via SUBCUTANEOUS
  Administered 2015-05-20 – 2015-05-21 (×3): 2 [IU] via SUBCUTANEOUS
  Administered 2015-05-21: 8 [IU] via SUBCUTANEOUS
  Administered 2015-05-21: 12 [IU] via SUBCUTANEOUS
  Administered 2015-05-21: 8 [IU] via SUBCUTANEOUS
  Administered 2015-05-21: 12 [IU] via SUBCUTANEOUS
  Administered 2015-05-22: 4 [IU] via SUBCUTANEOUS
  Administered 2015-05-22 (×2): 8 [IU] via SUBCUTANEOUS
  Administered 2015-05-22: 4 [IU] via SUBCUTANEOUS
  Administered 2015-05-22: 2 [IU] via SUBCUTANEOUS
  Administered 2015-05-22: 4 [IU] via SUBCUTANEOUS
  Administered 2015-05-23: 8 [IU] via SUBCUTANEOUS
  Administered 2015-05-23 (×4): 2 [IU] via SUBCUTANEOUS
  Administered 2015-05-23: 4 [IU] via SUBCUTANEOUS
  Administered 2015-05-23: 8 [IU] via SUBCUTANEOUS
  Administered 2015-05-24 (×3): 2 [IU] via SUBCUTANEOUS
  Administered 2015-05-24: 4 [IU] via SUBCUTANEOUS
  Administered 2015-05-24 – 2015-05-25 (×2): 8 [IU] via SUBCUTANEOUS
  Administered 2015-05-25: 2 [IU] via SUBCUTANEOUS
  Administered 2015-05-25 (×2): 4 [IU] via SUBCUTANEOUS
  Administered 2015-05-25 – 2015-05-26 (×2): 2 [IU] via SUBCUTANEOUS
  Administered 2015-05-26: 4 [IU] via SUBCUTANEOUS
  Administered 2015-05-26: 8 [IU] via SUBCUTANEOUS
  Administered 2015-05-26: 4 [IU] via SUBCUTANEOUS
  Administered 2015-05-27: 2 [IU] via SUBCUTANEOUS
  Administered 2015-05-27: 4 [IU] via SUBCUTANEOUS
  Administered 2015-05-27: 12 [IU] via SUBCUTANEOUS
  Administered 2015-05-27 (×2): 4 [IU] via SUBCUTANEOUS
  Administered 2015-05-28: 12 [IU] via SUBCUTANEOUS
  Administered 2015-05-28: 2 [IU] via SUBCUTANEOUS
  Administered 2015-05-28 (×2): 4 [IU] via SUBCUTANEOUS
  Administered 2015-05-28: 8 [IU] via SUBCUTANEOUS
  Administered 2015-05-28: 2 [IU] via SUBCUTANEOUS
  Administered 2015-05-29: 8 [IU] via SUBCUTANEOUS
  Administered 2015-05-29: 2 [IU] via SUBCUTANEOUS
  Administered 2015-05-29 (×2): 8 [IU] via SUBCUTANEOUS
  Administered 2015-05-29 (×2): 2 [IU] via SUBCUTANEOUS
  Administered 2015-05-30 (×3): 8 [IU] via SUBCUTANEOUS
  Administered 2015-05-30: 4 [IU] via SUBCUTANEOUS
  Administered 2015-05-30 – 2015-05-31 (×3): 8 [IU] via SUBCUTANEOUS
  Administered 2015-05-31 – 2015-06-01 (×5): 2 [IU] via SUBCUTANEOUS
  Administered 2015-06-01: 4 [IU] via SUBCUTANEOUS
  Administered 2015-06-01: 2 [IU] via SUBCUTANEOUS
  Administered 2015-06-01: 8 [IU] via SUBCUTANEOUS
  Administered 2015-06-01: 2 [IU] via SUBCUTANEOUS
  Administered 2015-06-02 (×2): 4 [IU] via SUBCUTANEOUS
  Administered 2015-06-02: 2 [IU] via SUBCUTANEOUS
  Administered 2015-06-02 (×2): 4 [IU] via SUBCUTANEOUS
  Administered 2015-06-02: 8 [IU] via SUBCUTANEOUS
  Administered 2015-06-03 (×3): 4 [IU] via SUBCUTANEOUS
  Administered 2015-06-03: 2 [IU] via SUBCUTANEOUS
  Administered 2015-06-03 (×2): 4 [IU] via SUBCUTANEOUS
  Administered 2015-06-04: 8 [IU] via SUBCUTANEOUS
  Administered 2015-06-04: 2 [IU] via SUBCUTANEOUS
  Administered 2015-06-04 (×2): 8 [IU] via SUBCUTANEOUS
  Administered 2015-06-04 (×2): 2 [IU] via SUBCUTANEOUS
  Administered 2015-06-05: 12 [IU] via SUBCUTANEOUS
  Administered 2015-06-05 (×2): 4 [IU] via SUBCUTANEOUS
  Administered 2015-06-05: 2 [IU] via SUBCUTANEOUS
  Administered 2015-06-05: 4 [IU] via SUBCUTANEOUS
  Administered 2015-06-05: 2 [IU] via SUBCUTANEOUS
  Administered 2015-06-06 (×3): 4 [IU] via SUBCUTANEOUS
  Administered 2015-06-06: 12 [IU] via SUBCUTANEOUS
  Administered 2015-06-06: 8 [IU] via SUBCUTANEOUS
  Administered 2015-06-06 (×2): 2 [IU] via SUBCUTANEOUS
  Administered 2015-06-07 (×2): 8 [IU] via SUBCUTANEOUS
  Administered 2015-06-07 (×3): 4 [IU] via SUBCUTANEOUS
  Administered 2015-06-08: 2 [IU] via SUBCUTANEOUS
  Administered 2015-06-08: 8 [IU] via SUBCUTANEOUS
  Administered 2015-06-08: 2 [IU] via SUBCUTANEOUS
  Administered 2015-06-08: 4 [IU] via SUBCUTANEOUS
  Administered 2015-06-08: 2 [IU] via SUBCUTANEOUS
  Administered 2015-06-08: 8 [IU] via SUBCUTANEOUS
  Administered 2015-06-09: 4 [IU] via SUBCUTANEOUS
  Administered 2015-06-09: 2 [IU] via SUBCUTANEOUS
  Administered 2015-06-09 (×2): 4 [IU] via SUBCUTANEOUS
  Administered 2015-06-09: 12 [IU] via SUBCUTANEOUS
  Administered 2015-06-09: 2 [IU] via SUBCUTANEOUS
  Administered 2015-06-10 (×2): 4 [IU] via SUBCUTANEOUS
  Administered 2015-06-10: 12 [IU] via SUBCUTANEOUS
  Administered 2015-06-10: 2 [IU] via SUBCUTANEOUS
  Administered 2015-06-10 – 2015-06-11 (×3): 4 [IU] via SUBCUTANEOUS
  Administered 2015-06-11: 8 [IU] via SUBCUTANEOUS
  Administered 2015-06-11 – 2015-06-12 (×4): 4 [IU] via SUBCUTANEOUS
  Administered 2015-06-12: 2 [IU] via SUBCUTANEOUS
  Administered 2015-06-12 (×2): 4 [IU] via SUBCUTANEOUS
  Administered 2015-06-12 (×2): 8 [IU] via SUBCUTANEOUS
  Administered 2015-06-12: 2 [IU] via SUBCUTANEOUS
  Administered 2015-06-13 (×2): 4 [IU] via SUBCUTANEOUS
  Administered 2015-06-13: 8 [IU] via SUBCUTANEOUS
  Administered 2015-06-13 – 2015-06-14 (×2): 4 [IU] via SUBCUTANEOUS
  Administered 2015-06-14: 8 [IU] via SUBCUTANEOUS
  Administered 2015-06-14: 4 [IU] via SUBCUTANEOUS
  Administered 2015-06-14 (×2): 8 [IU] via SUBCUTANEOUS
  Administered 2015-06-15: 2 [IU] via SUBCUTANEOUS
  Administered 2015-06-15: 8 [IU] via SUBCUTANEOUS
  Administered 2015-06-15: 2 [IU] via SUBCUTANEOUS
  Administered 2015-06-15: 8 [IU] via SUBCUTANEOUS
  Administered 2015-06-15: 4 [IU] via SUBCUTANEOUS
  Administered 2015-06-16: 8 [IU] via SUBCUTANEOUS
  Administered 2015-06-16 (×2): 4 [IU] via SUBCUTANEOUS
  Administered 2015-06-16: 8 [IU] via SUBCUTANEOUS
  Administered 2015-06-16 – 2015-06-17 (×3): 4 [IU] via SUBCUTANEOUS
  Administered 2015-06-17: 2 [IU] via SUBCUTANEOUS
  Administered 2015-06-17: 12 [IU] via SUBCUTANEOUS

## 2015-05-11 NOTE — Progress Notes (Signed)
Pt with c/o 10/10 pain when asked, drifts to sleeps within mins, no grimacing, no moaning with no signs of distress. Will continue to monitor. Sherrie Mustache

## 2015-05-11 NOTE — Progress Notes (Signed)
After following rapid wean protocol, patient performed NIF (-60) and FVC (535ml) and cuff leak was present. Patient was extubated to 5L nasal cannula and tolerated procedure well with no complications. Patient's best out of 5 tries was 4100ml on initial IS following extubation. RT will continue to monitor.

## 2015-05-11 NOTE — Op Note (Signed)
NAMELATORSHA, CURLING NO.:  [id number]  MEDICAL RECORD NO.:  [id number]  LOCATION:  2S13C                        FACILITY:  [facility]  PHYSICIAN:  Lanelle Bal, MD    DATE OF BIRTH:  [birth year]  DATE OF PROCEDURE:  05/10/2015 DATE OF DISCHARGE:                              OPERATIVE REPORT   PREOPERATIVE DIAGNOSIS:  Adenocarcinoma of the distal esophagus.  POSTOPERATIVE DIAGNOSIS:  Adenocarcinoma of the distal esophagus.  PROCEDURE:  Video bronchoscopy, transhiatal total esophagectomy with cervical esophagogastrostomy, pyloroplasty, feeding jejunostomy.  SURGEON:  Lanelle Bal, MD.  FIRST ASSISTANT:  Providence Crosby, PA.  BRIEF HISTORY:  The patient is a 69 year old female, who presented approximately 8 months prior with difficulty swallowing and was identified and diagnosed with adenocarcinoma of the distal esophagus. Esophageal ultrasound staged at III.  The patient had undergone a course of chemotherapy and radiation treatment in the fall of 2015, and because of poor p.o. intake, had a percutaneous feeding gastrostomy tube placed. She was initially seen for consideration of surgery and was in very poor medical condition at that time, having just recovered from hospitalization for pneumonia and pulmonary embolus.  Since that time, she has gradually improved and her gastrostomy tube had been removed and she had gradually improved to a  much higher functional level.  Repeat endoscopy in June and repeat scans and esophageal ultrasound showed tumor in the esophagus, but without any distant spread.  She was referred by Oncology for reconsideration of esophageal resection with her much improved physical condition and still limited disease to the esophagus.  The risks and options were discussed with her and her family in detail, and she was willing to proceed.  She signed informed consent.  DESCRIPTION OF PROCEDURE:  The patient underwent general  endotracheal anesthesia with the NIMs tube.  Without difficulty through this tube, a fiberoptic bronchoscope was passed to the subsegmental level both in the right and left tracheobronchial tree without evidence of endobronchial lesions.  The scope was removed.  The patient was prepped and draped with Betadine with the left neck, chest, and abdomen prepped.  After appropriate time-out, we proceeded with the upper abdominal incision, explored the abdomen.  There was no evidence of metastatic spread within the abdominal cavity.  We then proceeded with taking down the short gastric vessels with the ligature and cauterizing device.  We then dissected at the esophageal hiatus.  This was a difficult dissection related to the desmoplastic reaction of radiation.  Biopsies were obtained along the area of the diaphragm and GE junction to confirm, but no tumor could be confirmed.  We then proceeded with the dissection of the hiatus encircling the esophagus with a red rubber catheter.  Through the hiatus, which had been enlarged, we proceeded with dissection up along the esophagus.  A good portion of this was done under direct vision with small vessels and adhesions being divided with the Harmonic scalpel.  With this dissection, it was obvious that we entered both pleural spaces with the dissection of transhiatal as far as we could go from the abdomen, we then entered the lesser sac, identified the cardinal vein and left gastric artery.  There was a  good pulse in the right gastroepiploic vessel and this was preserved.  With the bulldog on the left gastric artery, there was still easily palpable pulse in the right gastroepiploic.  We then kocherized the duodenum to allow better mobility of the gastric tube, and incision was then made in a long sternocleidomastoid muscle low in the neck on the left side.  Dissection was carried through to platysma.  The sternocleidomastoid muscle and jugular vein and  carotid artery were retracted laterally, and the dissection was carried down to the prevertebral fascia identifying the cervical esophagus.  During this dissection, the NIMs nerve stimulator was used to positively identify the recurrent nerve, which was preserved.  Penrose drain was placed around the cervical esophagus and the dissection proceeded from below and above until the esophagus was freed.  The GIA stapler was used to divide the cervical esophagus, and the specimen was delivered to the abdomen.  Using successive firings of the GIA stapler across the stomach, gastric tube was performed, the staple line was then oversewn with a running 3-0 Prolene suture.  Left gastric vein and artery were divided with vascular staplers.  The gastric tube appeared viable, the site of the proximal anastomosis was marked on the stomach, being careful not to allow any twisting and to avoid anastomosing to the very tip of the fundus.  The gastric margins were free of tumor by frozen section.  We then performed a standard pyloroplasty dividing across the pylorus and closing with interrupted Vicryl sutures 4-0 and interrupted 3-0 silk sutures.  The gastric tube was then placed in a sterile telescope bag to facilitate transfer to the cervical incision.  A Foley catheter was placed through the posterior mediastinum and attached to the gastric tube in the plastic bag, and the specimen was delivered nicely into the neck.  The cervical esophagus was then tacked with 3-0 silk sutures to the previously marked site for the anastomosis.  The stapled end of the esophagus was then opened and a small opening in the gastric tube was made.  A 30 mm Endo stapler was prepared.  The mucosal edges of the stomach and the esophagus were approximated with interrupted 4-0 Vicryl sutures.  The stapler was then placed with one arm in the esophagus and one in the stomach and the posterior wall of anastomosis was completed.  The  NG tube which had been positioned in the esophagus was then passed through the anastomosis and positioned into the stomach just above the pyloroplasty.  The anterior portion of anastomosis was then closed in 2 layers with interrupted Vicryl and 3-0 silk sutures.  A Penrose drain was placed into the depth of the cervical incision and brought out through a separate site in the left neck.  Interrupted 3-0 Vicryl sutures were used to reapproximate the neck incision and platysma, running 3-0 and 4-0 Vicryl in the subcutaneous tissue.  We then returned to the abdominal incision where the esophageal hiatus was closed and the stomach fixed to the hiatus to prevent any herniation.  The proximal jejunum was identified, and an 18 red rubber catheter with extra holes and the tip removed was witzeled into the small bowel and brought out through the left abdominal wall. The small bowel was tacked to the inner abdominal wall to prevent torsion or herniation.  A 28 chest tube was placed into each pleural space.  The abdomen was then closed with #1 running Prolene suture, a running 2-0 Vicryl, and skin staples.  Dry dressings were  applied.  At the completion of procedure, sponge and needle count was reported as correct.  The patient tolerated the procedure without obvious complication.  Estimated blood loss approximately 250 mL.  The patient did not require any blood products during the procedure.  It should be noted that during the procedure, the NIMs stimulation device was used to locate the recurrent nerve satisfactorily, and care was taken to avoid any retractors on the recurrent nerve or injury to it.  At the completion of the anastomosis as we closed incision, repeat stimulation of the recurrent nerve showed it to be intact.     Lanelle Bal, MD     EG/MEDQ  D:  05/11/2015  T:  05/11/2015  Job:  [id number]  cc:   Marice Potter, MD

## 2015-05-11 NOTE — Progress Notes (Signed)
Initial Nutrition Assessment  DOCUMENTATION CODES:   Not applicable  INTERVENTION:    Once J-tube ready to be used, recommend Vital AF 1.2 formula at goal rate of 70 ml/hr  Total TF regimen to provide 2016 kcals, 126 gm protein, 1362 ml of free water  NUTRITION DIAGNOSIS:   Inadequate oral intake related to inability to eat (s/p esophageal resection) as evidenced by NPO status  GOAL:   Patient will meet greater than or equal to 90% of their needs  MONITOR:   TF tolerance, Diet advancement, Labs, Weight trends, I & O's  REASON FOR ASSESSMENT:   Malnutrition Screening Tool  ASSESSMENT:  69 y.o. 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 The patient has completed neoadjuvant chemotherapy radiation with weekly carboplatin and and paclltaxel in AUG 2015. Her treatment course has been complicated with severe dehydration poor nutrition, requiring a PEG tube to be placed which becameinfected and had to be replaced several times. The patient needed supplementary IV fluids through a PICC line.   Patient s/p procedures 7/12: VIDEO BRONCHOSCOPY  TRANSHIATAL TOTAL ESOPHAGEAL RESECTION FEEDING JEJUNOSTOMY   Pt very sleepy upon visit.  RD unable to obtain nutrition hx or completed Nutrition Focused Physical Exam.  Per H&P, pt was consuming PO diet without difficulty PTA.  Per readings below, pt's weight has been stable.  Diet Order:  Diet NPO time specified  Skin:  Reviewed, no issues  Last BM:  Unknown  Height:   Ht Readings from Last 1 Encounters:  05/10/15 5\' 2"  (1.575 m)    Weight:   Wt Readings from Last 1 Encounters:  05/10/15 157 lb (71.215 kg)    Ideal Body Weight:  50 kg  Wt Readings from Last 10 Encounters:  05/10/15 157 lb (71.215 kg)  05/06/15 157 lb 11.2 oz (71.532 kg)  04/28/15 156 lb (70.761 kg)  04/04/15 156 lb (70.761 kg)  03/31/15 151 lb (68.493 kg)   03/30/15 151 lb (68.493 kg)  03/15/15 151 lb (68.493 kg)  11/30/14 157 lb 3 oz (71.3 kg)  09/16/14 151 lb (68.493 kg)  08/10/14 136 lb (61.689 kg)    BMI:  Body mass index is 28.71 kg/(m^2).  Estimated Nutritional Needs:   Kcal:  2000-2200  Protein:  110-120 gm  Fluid:  2.0-2.2 L  EDUCATION NEEDS:   No education needs identified at this time  Arthur Holms, RD, LDN Pager #: [phone] After-Hours Pager #: [phone]

## 2015-05-11 NOTE — Progress Notes (Signed)
Patient tolerated SIMV with a rate of 4 well for twenty minutes. Per rapid wean protocol, she is now on CPAP/PS 10/5 40% and tolerating it well. RT is at bedside and will continue to monitor.

## 2015-05-11 NOTE — Procedures (Signed)
Arterial Catheter Insertion Procedure Note AJAI TERHAAR [id number] 1945-12-31  Procedure: Insertion of Arterial Catheter  Indications: Blood pressure monitoring and Frequent blood sampling  Procedure Details Consent: Unable to obtain consent because of altered level of consciousness. Time Out: Verified patient identification, verified procedure, site/side was marked, verified correct patient position, special equipment/implants available, medications/allergies/relevent history reviewed, required imaging and test results available.  Performed  Maximum sterile technique was used including antiseptics, cap, gloves, gown, hand hygiene, mask and sheet. Skin prep: Chlorhexidine; local anesthetic administered 20 gauge catheter was inserted into left radial artery using the Seldinger technique.  Evaluation Blood flow good; BP tracing good. Complications: No apparent complications.   Jori Moll 05/11/2015   Arterial line inserted with success on one attempt. Patient tolerated procedure well and maximum sterile technique was practiced. RT will continue to monitor.

## 2015-05-11 NOTE — Progress Notes (Signed)
15cc of Fentanyl PCA used during tubing prime, wittnessed by Eleonore Chiquito RN.

## 2015-05-11 NOTE — Plan of Care (Signed)
Problem: Phase I Progression Outcomes Goal: OOB as tolerated unless otherwise ordered Outcome: Not Progressing intubated

## 2015-05-11 NOTE — Progress Notes (Signed)
Again updated Dr Darcey Nora on pt status; UOP 20cc over past 2 hours, CVP reading 12-13, pt RR 35 on 0.2 of precedex and still complaining of pain after 2mg  morphine given. New orders received, will implement and monitor pt.

## 2015-05-11 NOTE — Progress Notes (Signed)
Began rapid wean protocol and decreased rate to 4. Patient is tolerating it well and showing no signs of distress. RT at bedside and will continue to monitor.

## 2015-05-11 NOTE — Progress Notes (Signed)
      [address]       [place] [address]             [phone]      Resting comfortably  BP 125/60 mmHg  Pulse 120  Temp(Src) 98.4 F (36.9 C) (Oral)  Resp 24  Ht 5\' 2"  (1.575 m)  Wt 157 lb (71.215 kg)  BMI 28.71 kg/m2  SpO2 95%   Intake/Output Summary (Last 24 hours) at 05/11/15 1902 Last data filed at 05/11/15 1830  Gross per 24 hour  Intake 4070.91 ml  Output   2197 ml  Net 1873.91 ml   CBG OK  Continue present care  Remo Lipps C. Roxan Hockey, MD [facility] [phone]

## 2015-05-11 NOTE — Progress Notes (Signed)
Patient ID: Sharon Price, female   DOB: 11/09/1945, 69 y.o.   MRN: [id number] TCTS DAILY ICU PROGRESS NOTE                   [address]            [place] [address]          [phone]   1 Day Post-Op Procedure(s) (LRB): VIDEO BRONCHOSCOPY (N/A) TRANSHIATAL TOTAL ESOPHAGEAL RESECTION (N/A) FEEDING JEJUNOSTOMY (N/A)  Total Length of Stay:  LOS: 1 day   Subjective: Sleepy but neuro intact, can sing "e" well  Objective: Vital signs in last 24 hours: Temp:  [93 F (33.9 C)-98.8 F (37.1 C)] 98.7 F (37.1 C) (07/13 0400) Pulse Rate:  [57-115] 103 (07/13 0645) Cardiac Rhythm:  [-] Sinus tachycardia (07/13 0600) Resp:  [12-28] 22 (07/13 0645) BP: (70-150)/(39-63) 95/52 mmHg (07/13 0645) SpO2:  [94 %-100 %] 96 % (07/13 0645) Arterial Line BP: (63-136)/(31-80) 98/45 mmHg (07/13 0645) FiO2 (%):  [40 %-50 %] 40 % (07/13 0225)  Filed Weights   05/10/15 0606  Weight: 157 lb (71.215 kg)    Weight change:    Hemodynamic parameters for last 24 hours: CVP:  [7 mmHg-13 mmHg] 7 mmHg  Intake/Output from previous day: 07/12 0701 - 07/13 0700 In: 6174.3 [I.V.:5574.3; IV Piggyback:600] Out: 3098 [Urine:2080; Blood:500; Chest Tube:518]  Intake/Output this shift:    Current Meds: Scheduled Meds: . albumin human  12.5 g Intravenous Once  . antiseptic oral rinse  7 mL Mouth Rinse QID  . cefOXitin  2 g Intravenous Q8H  . chlorhexidine  15 mL Mouth Rinse BID  . fentaNYL   Intravenous 6 times per day  . metoCLOPramide (REGLAN) injection  10 mg Intravenous 4 times per day  . metoprolol  5 mg Intravenous Q12H  . pantoprazole (PROTONIX) IV  40 mg Intravenous Q12H  . sodium chloride  10 mL Intravenous Q12H   Continuous Infusions: . 0.45 % NaCl with KCl 20 mEq / L 100 mL/hr at 05/11/15 0600  . dexmedetomidine Stopped (05/11/15 0330)  . DOPamine 3 mcg/kg/min (05/11/15 0600)  . insulin (NOVOLIN-R) infusion 2 mL/hr at 05/11/15 0600  . phenylephrine (NEO-SYNEPHRINE)  Adult infusion 40 mcg/min (05/11/15 0659)   PRN Meds:.Place/Maintain arterial line **AND** sodium chloride, albuterol, diphenhydrAMINE, fentaNYL (SUBLIMAZE) injection, hydrALAZINE, naloxone **AND** sodium chloride, ondansetron (ZOFRAN) IV, potassium chloride  General appearance: cooperative Neurologic: intact Heart: regular rate and rhythm, S1, S2 normal, no murmur, click, rub or gallop Lungs: diminished breath sounds bibasilar Abdomen: dressing inatct, no bowel sounds yet Extremities: extremities normal, atraumatic, no cyanosis or edema and Homans sign is negative, no sign of DVT Wound: dressing inatct, no drainage from left neck drain  Lab Results: CBC: Recent Labs  05/10/15 2030 05/11/15 0523  WBC 8.7 11.6*  HGB 12.7 13.1  HCT 36.9 38.2  PLT 139* 155   BMET:  Recent Labs  05/10/15 2030 05/11/15 0523  NA 133* 134*  K 4.3 4.2  CL 106 104  CO2 20* 21*  GLUCOSE 195* 134*  BUN 12 13  CREATININE 1.12* 1.44*  CALCIUM 8.0* 9.0    PT/INR: No results for input(s): LABPROT, INR in the last 72 hours. Radiology: Dg Chest Port 1 View  05/11/2015   CLINICAL DATA:  Status post esophagectomy  EXAM: PORTABLE CHEST - 1 VIEW  COMPARISON:  Chest x-ray of May 10, 2015  FINDINGS: The lungs remain mildly hypoinflated. Bilateral chest tubes are unchanged in position.  There has been interval extubation of the trachea. Small amounts of pleural fluid at the lung bases are suspected. The cardiac silhouette is mildly enlarged. The central pulmonary vascularity is prominent and slightly more conspicuous today. The esophagogastric tube and the right internal jugular catheters remain.  IMPRESSION: 1. Interval extubation of the trachea. There is persistent bilateral hypo inflation and slight interval increased prominence of the pulmonary interstitium which may reflect mild edema. Small bilateral pleural effusions are present. There is no pneumothorax. 2. The remaining support tubes are in stable position.    Electronically Signed   By: David  Martinique M.D.   On: 05/11/2015 07:20   Dg Chest Port 1 View  05/10/2015   CLINICAL DATA:  Postop esophagectomy, esophageal cancer  EXAM: PORTABLE CHEST - 1 VIEW  COMPARISON:  05/06/2015  FINDINGS: Endotracheal tube 4.5 cm above the carina. NG tube in the midline extending below the hemidiaphragms. Right IJ central line tip at the lower SVC level. Bilateral chest tubes in place. No pneumothorax or large effusion. Left lower lobe collapse/ consolidation noted obscuring the left hemidiaphragm. Minor right base atelectasis.  IMPRESSION: Support apparatus in good position.  No significant pneumothorax or large effusion  Left greater the right basilar atelectasis.   Electronically Signed   By: Jerilynn Mages.  Shick M.D.   On: 05/10/2015 19:04     Assessment/Plan: S/P Procedure(s) (LRB): VIDEO BRONCHOSCOPY (N/A) TRANSHIATAL TOTAL ESOPHAGEAL RESECTION (N/A) FEEDING JEJUNOSTOMY (N/A) Mobilize Continue foley due to strict I&O, patient in ICU and urinary output monitoring manage DM Mild elevation of cr but not to level of kidney injury, will give addition volume Leave chest tubes in place Continue insulin drip today   Grace Isaac 05/11/2015 7:48 AM

## 2015-05-12 ENCOUNTER — Inpatient Hospital Stay (HOSPITAL_COMMUNITY): Payer: Commercial Managed Care - PPO

## 2015-05-12 LAB — CBC
HCT: 35.7 % — ABNORMAL LOW (ref 36.0–46.0)
Hemoglobin: 12.3 g/dL (ref 12.0–15.0)
MCH: 30.1 pg (ref 26.0–34.0)
MCHC: 34.5 g/dL (ref 30.0–36.0)
MCV: 87.5 fL (ref 78.0–100.0)
Platelets: 136 10*3/uL — ABNORMAL LOW (ref 150–400)
RBC: 4.08 MIL/uL (ref 3.87–5.11)
RDW: 14.1 % (ref 11.5–15.5)
WBC: 19.5 10*3/uL — ABNORMAL HIGH (ref 4.0–10.5)

## 2015-05-12 LAB — BASIC METABOLIC PANEL
Anion gap: 7 (ref 5–15)
BUN: 16 mg/dL (ref 6–20)
CO2: 19 mmol/L — ABNORMAL LOW (ref 22–32)
Calcium: 8 mg/dL — ABNORMAL LOW (ref 8.9–10.3)
Chloride: 104 mmol/L (ref 101–111)
Creatinine, Ser: 1.4 mg/dL — ABNORMAL HIGH (ref 0.44–1.00)
GFR calc Af Amer: 44 mL/min — ABNORMAL LOW (ref >60)
GFR calc non Af Amer: 38 mL/min — ABNORMAL LOW (ref >60)
Glucose, Bld: 190 mg/dL — ABNORMAL HIGH (ref 65–99)
Potassium: 4.8 mmol/L (ref 3.5–5.1)
Sodium: 130 mmol/L — ABNORMAL LOW (ref 135–145)

## 2015-05-12 LAB — GLUCOSE, CAPILLARY
Glucose-Capillary: 154 mg/dL — ABNORMAL HIGH (ref 65–99)
Glucose-Capillary: 171 mg/dL — ABNORMAL HIGH (ref 65–99)
Glucose-Capillary: 175 mg/dL — ABNORMAL HIGH (ref 65–99)
Glucose-Capillary: 184 mg/dL — ABNORMAL HIGH (ref 65–99)
Glucose-Capillary: 185 mg/dL — ABNORMAL HIGH (ref 65–99)

## 2015-05-12 LAB — PROTIME-INR
INR: 1.37 (ref 0.00–1.49)
Prothrombin Time: 17 seconds — ABNORMAL HIGH (ref 11.6–15.2)

## 2015-05-12 MED ORDER — AMIODARONE HCL IN DEXTROSE 360-4.14 MG/200ML-% IV SOLN
60.0000 mg/h | INTRAVENOUS | Status: AC
  Administered 2015-05-12 (×2): 60 mg/h via INTRAVENOUS
  Filled 2015-05-12: qty 200

## 2015-05-12 MED ORDER — AMIODARONE HCL IN DEXTROSE 360-4.14 MG/200ML-% IV SOLN
30.0000 mg/h | INTRAVENOUS | Status: DC
  Administered 2015-05-13 – 2015-05-18 (×11): 30 mg/h via INTRAVENOUS
  Filled 2015-05-12 (×26): qty 200

## 2015-05-12 MED ORDER — VITAL AF 1.2 CAL PO LIQD
1000.0000 mL | ORAL | Status: DC
  Administered 2015-05-12: 1000 mL
  Filled 2015-05-12: qty 1000

## 2015-05-12 MED ORDER — AMIODARONE LOAD VIA INFUSION
150.0000 mg | Freq: Once | INTRAVENOUS | Status: AC
  Administered 2015-05-12: 150 mg via INTRAVENOUS
  Filled 2015-05-12: qty 83.34

## 2015-05-12 MED ORDER — INSULIN DETEMIR 100 UNIT/ML ~~LOC~~ SOLN
10.0000 [IU] | Freq: Every day | SUBCUTANEOUS | Status: DC
Start: 2015-05-12 — End: 2015-05-22
  Administered 2015-05-12 – 2015-05-21 (×9): 10 [IU] via SUBCUTANEOUS
  Filled 2015-05-12 (×11): qty 0.1

## 2015-05-12 NOTE — Progress Notes (Signed)
Patient ID: Sharon Price, female   DOB: 1945/11/25, 69 y.o.   MRN: [id number] EVENING ROUNDS NOTE :     [address]       [place] [address]             [phone]                 2 Days Post-Op Procedure(s) (LRB): VIDEO BRONCHOSCOPY (N/A) TRANSHIATAL TOTAL ESOPHAGEAL RESECTION (N/A) FEEDING JEJUNOSTOMY (N/A)  Total Length of Stay:  LOS: 2 days  BP 104/66 mmHg  Pulse 110  Temp(Src) 98.3 F (36.8 C) (Oral)  Resp 19  Ht 5\' 2"  (1.575 m)  Wt 157 lb (71.215 kg)  BMI 28.71 kg/m2  SpO2 95%  .Intake/Output      07/13 0701 - 07/14 0700 07/14 0701 - 07/15 0700   I.V. (mL/kg) 3239.7 (45.5) 1762.9 (24.8)   Other 90    NG/GT  20   IV Piggyback 750    Total Intake(mL/kg) 4079.7 (57.3) 1782.9 (25)   Urine (mL/kg/hr) 1075 (0.6) 430 (0.5)   Emesis/NG output 110 (0.1) 200 (0.2)   Drains 0 (0)    Blood     Chest Tube 1170 (0.7) 420 (0.5)   Total Output 2355 1050   Net +1724.7 +732.9          . 0.45 % NaCl with KCl 20 mEq / L 100 mL/hr (05/12/15 1256)  . amiodarone 60 mg/hr (05/12/15 1659)   Followed by  . amiodarone    . feeding supplement (VITAL AF 1.2 CAL) 1,000 mL (05/12/15 1600)  . lactated ringers    . phenylephrine (NEO-SYNEPHRINE) Adult infusion 15 mcg/min (05/12/15 1800)     Lab Results  Component Value Date   WBC 19.5* 05/12/2015   HGB 12.3 05/12/2015   HCT 35.7* 05/12/2015   PLT 136* 05/12/2015   GLUCOSE 190* 05/12/2015   ALT 14 05/06/2015   AST 21 05/06/2015   NA 130* 05/12/2015   K 4.8 05/12/2015   CL 104 05/12/2015   CREATININE 1.40* 05/12/2015   BUN 16 05/12/2015   CO2 19* 05/12/2015   INR 1.37 05/12/2015   HGBA1C 8.0* 05/10/2015   Episode of afib, started on cordarone  Grace Isaac MD  Beeper [phone] Office [phone] 05/12/2015 6:29 PM

## 2015-05-12 NOTE — Progress Notes (Addendum)
Patient ID: Sharon Price, female   DOB: 11-Apr-1946, 69 y.o.   MRN: [id number] TCTS DAILY ICU PROGRESS NOTE                   [address]            [place] [address]          [phone]   2 Days Post-Op Procedure(s) (LRB): VIDEO BRONCHOSCOPY (N/A) TRANSHIATAL TOTAL ESOPHAGEAL RESECTION (N/A) FEEDING JEJUNOSTOMY (N/A)  Total Length of Stay:  LOS: 2 days   Subjective: More alert, sitting in chair this am, better pain control  Objective: Vital signs in last 24 hours: Temp:  [98 F (36.7 C)-98.8 F (37.1 C)] 98.5 F (36.9 C) (07/14 0400) Pulse Rate:  [98-128] 113 (07/14 0645) Cardiac Rhythm:  [-] Sinus tachycardia (07/14 0400) Resp:  [20-36] 23 (07/14 0645) BP: (82-140)/(37-92) 124/71 mmHg (07/14 0600) SpO2:  [24 %-98 %] 96 % (07/14 0645) Arterial Line BP: (75-168)/(39-67) 115/46 mmHg (07/14 0645) FiO2 (%):  [25 %] 25 % (07/13 1200)  Filed Weights   05/10/15 0606  Weight: 157 lb (71.215 kg)    Weight change:    Hemodynamic parameters for last 24 hours: CVP:  [4 mmHg-7 mmHg] 5 mmHg  Intake/Output from previous day: 07/13 0701 - 07/14 0700 In: 4079.7 [I.V.:3239.7; IV Piggyback:750] Out: 2355 [Urine:1075; Emesis/NG output:110; Chest Tube:1170]  Intake/Output this shift:    Current Meds: Scheduled Meds: . albumin human  12.5 g Intravenous Once  . antiseptic oral rinse  7 mL Mouth Rinse QID  . chlorhexidine  15 mL Mouth Rinse BID  . enoxaparin (LOVENOX) injection  30 mg Subcutaneous Q24H  . fentaNYL   Intravenous 6 times per day  . insulin aspart  0-24 Units Subcutaneous 6 times per day  . levalbuterol  0.63 mg Nebulization Q6H  . metoCLOPramide (REGLAN) injection  10 mg Intravenous 4 times per day  . metoprolol  5 mg Intravenous Q12H  . pantoprazole (PROTONIX) IV  40 mg Intravenous Q12H  . sodium chloride  10 mL Intravenous Q12H   Continuous Infusions: . 0.45 % NaCl with KCl 20 mEq / L 100 mL/hr at 05/12/15 0600  . dexmedetomidine  Stopped (05/11/15 0330)  . DOPamine 3 mcg/kg/min (05/12/15 0600)  . lactated ringers    . phenylephrine (NEO-SYNEPHRINE) Adult infusion 15 mcg/min (05/12/15 0600)   PRN Meds:.Place/Maintain arterial line **AND** sodium chloride, albuterol, diphenhydrAMINE, fentaNYL (SUBLIMAZE) injection, hydrALAZINE, naloxone **AND** sodium chloride, ondansetron (ZOFRAN) IV, potassium chloride  General appearance: alert, cooperative and no distress Neurologic: intact Heart: regular rate and rhythm, S1, S2 normal, no murmur, click, rub or gallop Lungs: diminished breath sounds bibasilar Abdomen: hypoactive bowel sounds Extremities: extremities normal, atraumatic, no cyanosis or edema, Homans sign is negative, no sign of DVT and pas hose on Wound: no drainage from neck drain  Lab Results: CBC: Recent Labs  05/11/15 2010 05/12/15 0445  WBC 18.7* 19.5*  HGB 12.3 12.3  HCT 35.8* 35.7*  PLT 141* 136*   BMET:  Recent Labs  05/11/15 2010 05/12/15 0445  NA 130* 130*  K 4.7 4.8  CL 102 104  CO2 18* 19*  GLUCOSE 189* 190*  BUN 16 16  CREATININE 1.64* 1.40*  CALCIUM 8.2* 8.0*    PT/INR:  Recent Labs  05/12/15 0445  LABPROT 17.0*  INR 1.37   Radiology: Dg Chest Port 1 View  05/12/2015   CLINICAL DATA:  Esophageal adenocarcinoma postop resection  EXAM: PORTABLE CHEST -  1 VIEW  COMPARISON:  05/11/2015  FINDINGS: NG tube remains in place extending below the diaphragm. Bilateral chest tubes in place. No pneumothorax. Right jugular central venous catheter tip at the cavoatrial junction.  Improved aeration with decrease in bibasilar atelectasis and slightly increased lung volume compared with the prior study.  IMPRESSION: Mild improvement in aeration in the lung bases compared with the prior study. No pneumothorax.   Electronically Signed   By: Franchot Gallo M.D.   On: 05/12/2015 07:42   Acute Kidney Injury (any one)  Increase in SCr by > 0.3 within 48 hours  Increase SCr to > 1.5 times  baseline  Urine volume < 0.5 ml/kg/h for 6 hrs  Stage:  Risk:   1.5x increase in creatinine or GFR decrease by 25% or UOP <0.24ml/kgperhr for 6 hrs  Injury:  2x increase in creatinine or GFR decrease by 50% or UOP < 0.76ml/kgperhr for 12 hr  Failure:3X increase in creatinine or GFR decrease by 75% or UOP < 0.52ml/kgperhr for 12 hr or                anuria 12 hrs  Loss: complete loss of kidney  function for more then 4 weeks  End-stage renal disease:Complete loss of kidney function for more then 3 months  Lab Results  Component Value Date   CREATININE 1.40* 05/12/2015   CREATININE: 1.4 mg/dL ABNORMAL (05/12/15 0445) Estimated creatinine clearance - 35.5 mL/min   Assessment/Plan: S/P Procedure(s) (LRB): VIDEO BRONCHOSCOPY (N/A) TRANSHIATAL TOTAL ESOPHAGEAL RESECTION (N/A) FEEDING JEJUNOSTOMY (N/A) Mobilize Diabetes control Continue foley due to strict I&O, patient in ICU and urinary output monitoring Start low rate trickle tube feeding today Resume levimir insulin Leucocytosis without other evidence of infection  Risk stage of acute kinney injury-  resolving  wean off dopamine So far no recurrence of afib On Lovenox for DVT -low dose until risk of bleeding decreases, was on xarelto preop  preop  Grace Isaac 05/12/2015 7:48 AM

## 2015-05-12 NOTE — Progress Notes (Signed)
Pt converted into afib with RVR HR between 110-150; MD notifed; Orders obtained to start amiodarone gtt and consult cardiology. Nurse will continue to monitor.

## 2015-05-12 NOTE — Care Management Note (Signed)
Case Management Note  Patient Details  Name: Sharon Price MRN: [id number] Date of Birth: 1946-10-28  Subjective/Objective:       Prior to admission lives at home with son.  Son does not work and states is able and will be with her 24/7 on discharge.               Action/Plan:   Expected Discharge Date:                  Expected Discharge Plan:  [place]  In-House Referral:     Discharge planning Services     Post Acute Care Choice:    Choice offered to:     DME Arranged:    DME Agency:     HH Arranged:    [place] Agency:     Status of Service:  In process, will continue to follow  Medicare Important Message Given:    Date Medicare IM Given:    Medicare IM give by:    Date Additional Medicare IM Given:    Additional Medicare Important Message give by:     If discussed at [place] of Stay Meetings, dates discussed:    Additional Comments:  Vergie Living, RN 05/12/2015, 10:27 AM

## 2015-05-12 NOTE — Progress Notes (Signed)
PT Cancellation Note  Patient Details Name: Sharon Price MRN: [id number] DOB: 02-May-1946   Cancelled Treatment:    Reason Eval/Treat Not Completed: Medical issues which prohibited therapy   HR 110-123 at rest; noted incr in neo-synephrine. Spoke with RN and agreed to hold PT at this time (she reports tachycardia is new for pt this pm). Will attempt eval 7/15   Lelend Heinecke 05/12/2015, 3:55 PM  Pager [phone]

## 2015-05-13 ENCOUNTER — Inpatient Hospital Stay (HOSPITAL_COMMUNITY): Payer: Commercial Managed Care - PPO

## 2015-05-13 DIAGNOSIS — I4892 Unspecified atrial flutter: Secondary | ICD-10-CM | POA: Diagnosis not present

## 2015-05-13 DIAGNOSIS — C159 Malignant neoplasm of esophagus, unspecified: Secondary | ICD-10-CM

## 2015-05-13 DIAGNOSIS — I484 Atypical atrial flutter: Secondary | ICD-10-CM

## 2015-05-13 LAB — GLUCOSE, CAPILLARY
Glucose-Capillary: 115 mg/dL — ABNORMAL HIGH (ref 65–99)
Glucose-Capillary: 143 mg/dL — ABNORMAL HIGH (ref 65–99)
Glucose-Capillary: 161 mg/dL — ABNORMAL HIGH (ref 65–99)
Glucose-Capillary: 162 mg/dL — ABNORMAL HIGH (ref 65–99)
Glucose-Capillary: 167 mg/dL — ABNORMAL HIGH (ref 65–99)
Glucose-Capillary: 123 mg/dL — ABNORMAL HIGH (ref 65–99)
Glucose-Capillary: 141 mg/dL — ABNORMAL HIGH (ref 65–99)

## 2015-05-13 LAB — CBC
HCT: 32.4 % — ABNORMAL LOW (ref 36.0–46.0)
Hemoglobin: 11.3 g/dL — ABNORMAL LOW (ref 12.0–15.0)
MCH: 30 pg (ref 26.0–34.0)
MCHC: 34.9 g/dL (ref 30.0–36.0)
MCV: 85.9 fL (ref 78.0–100.0)
Platelets: 120 10*3/uL — ABNORMAL LOW (ref 150–400)
RBC: 3.77 MIL/uL — ABNORMAL LOW (ref 3.87–5.11)
RDW: 14.3 % (ref 11.5–15.5)
WBC: 17.6 10*3/uL — ABNORMAL HIGH (ref 4.0–10.5)

## 2015-05-13 LAB — BASIC METABOLIC PANEL
Anion gap: 7 (ref 5–15)
BUN: 18 mg/dL (ref 6–20)
CO2: 17 mmol/L — ABNORMAL LOW (ref 22–32)
Calcium: 7.7 mg/dL — ABNORMAL LOW (ref 8.9–10.3)
Chloride: 101 mmol/L (ref 101–111)
Creatinine, Ser: 1.07 mg/dL — ABNORMAL HIGH (ref 0.44–1.00)
GFR calc Af Amer: >60 mL/min (ref >60)
GFR calc non Af Amer: 52 mL/min — ABNORMAL LOW (ref >60)
Glucose, Bld: 178 mg/dL — ABNORMAL HIGH (ref 65–99)
Potassium: 4.5 mmol/L (ref 3.5–5.1)
Sodium: 125 mmol/L — ABNORMAL LOW (ref 135–145)

## 2015-05-13 LAB — LACTIC ACID, PLASMA: Lactic Acid, Venous: 1.2 mmol/L (ref 0.5–2.0)

## 2015-05-13 MED ORDER — SODIUM BICARBONATE 8.4 % IV SOLN
INTRAVENOUS | Status: AC
  Administered 2015-05-13: 50 meq
  Filled 2015-05-13: qty 50

## 2015-05-13 MED ORDER — FUROSEMIDE 10 MG/ML IJ SOLN
20.0000 mg | Freq: Once | INTRAMUSCULAR | Status: AC
  Administered 2015-05-13: 20 mg via INTRAVENOUS

## 2015-05-13 MED ORDER — SODIUM BICARBONATE 8.4 % IV SOLN
25.0000 meq | Freq: Once | INTRAVENOUS | Status: AC
  Administered 2015-05-13: 25 meq via INTRAVENOUS

## 2015-05-13 MED ORDER — FUROSEMIDE 10 MG/ML IJ SOLN
INTRAMUSCULAR | Status: AC
  Administered 2015-05-13: 09:00:00
  Filled 2015-05-13: qty 2

## 2015-05-13 MED ORDER — VITAL AF 1.2 CAL PO LIQD
1000.0000 mL | ORAL | Status: DC
  Filled 2015-05-13 (×4): qty 1000

## 2015-05-13 MED ORDER — ALBUMIN HUMAN 5 % IV SOLN
INTRAVENOUS | Status: AC
  Administered 2015-05-13: 12.5 g
  Filled 2015-05-13: qty 250

## 2015-05-13 MED ORDER — ALBUMIN HUMAN 5 % IV SOLN
12.5000 g | Freq: Once | INTRAVENOUS | Status: AC
  Administered 2015-05-13: 12.5 g via INTRAVENOUS

## 2015-05-13 MED ORDER — VITAL HIGH PROTEIN PO LIQD
1000.0000 mL | ORAL | Status: DC
Start: 2015-05-13 — End: 2015-05-13

## 2015-05-13 NOTE — Progress Notes (Signed)
Nutrition Follow Up  DOCUMENTATION CODES:   Not applicable  INTERVENTION:   Continue to slowly advance tube feeding of Vital AF 1.2 via J-tube by 10 ml every 8 hours (or as directed by MD) to recommended goal rate of 70 ml/hr to provide 2016 kcal, 126 grams of protein, and 1361 ml of free water.   Once IV fluids are discontinued, recommend free water flushes via J-tube of 150 ml QID.   RD to continue to monitor.  NUTRITION DIAGNOSIS:   Inadequate oral intake related to inability to eat (s/p esophageal resection) as evidenced by NPO status; ongoing  GOAL:   Patient will meet greater than or equal to 90% of their needs; progressing  MONITOR:   TF tolerance, Weight trends, Labs, I & O's  REASON FOR ASSESSMENT:   Malnutrition Screening Tool    ASSESSMENT:   69 y.o. 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 The patient has completed neoadjuvant chemotherapy radiation with weekly carboplatin and and paclltaxel in AUG 2015. Her treatment course has been complicated with severe dehydration poor nutrition, requiring a PEG tube to be placed which becameinfected and had to be replaced several times. The patient needed supplementary IV fluids through a PICC line.   Patient s/p procedures 7/12: VIDEO BRONCHOSCOPY  TRANSHIATAL TOTAL ESOPHAGEAL RESECTION FEEDING JEJUNOSTOMY   Pt currently has Vital AF 1.2 formula running via J-tube at 20 ml/hr which provides 480 kcal (24% of kcal needs), 36 grams of protein (33% of protein needs), and 389 ml of free water. Pt has been tolerating her tube feedings well per RN. Spoke with MD, plans to advance tube feedings slowly as he reports pt with no bowel sounds. Goal rate recommendations have been stated above for when pt able to be fully advanced to goal. RD additionally given verbal consent to order TF protocol.   Nutrition-Focused physical exam  completed. Pt with no observed significant fat or muscle mass loss.   Labs: Low sodium, CO2, calcium, and GFR. High creatinine.   Diet Order:  Diet NPO time specified  Skin:  Wound (see comment) (Incision on abdomen, L neck, and wound on L knee)  Last BM:  Unknown  Height:   Ht Readings from Last 1 Encounters:  05/10/15 5\' 2"  (1.575 m)    Weight:   Wt Readings from Last 1 Encounters:  05/10/15 157 lb (71.215 kg)    Ideal Body Weight:  50 kg  Wt Readings from Last 10 Encounters:  05/10/15 157 lb (71.215 kg)  05/06/15 157 lb 11.2 oz (71.532 kg)  04/28/15 156 lb (70.761 kg)  04/04/15 156 lb (70.761 kg)  03/31/15 151 lb (68.493 kg)  03/30/15 151 lb (68.493 kg)  03/15/15 151 lb (68.493 kg)  11/30/14 157 lb 3 oz (71.3 kg)  09/16/14 151 lb (68.493 kg)  08/10/14 136 lb (61.689 kg)    BMI:  Body mass index is 28.71 kg/(m^2).  Estimated Nutritional Needs:   Kcal:  2000-2200  Protein:  110-120 gm  Fluid:  2.0-2.2 L  EDUCATION NEEDS:   No education needs identified at this time  Corrin Parker, MS, RD, LDN Pager # [phone] After hours/ weekend pager # [phone]

## 2015-05-13 NOTE — Progress Notes (Addendum)
TCTS DAILY ICU PROGRESS NOTE                   [address]            [place] [address]          [phone]   3 Days Post-Op Procedure(s) (LRB): VIDEO BRONCHOSCOPY (N/A) TRANSHIATAL TOTAL ESOPHAGEAL RESECTION (N/A) FEEDING JEJUNOSTOMY (N/A)  Total Length of Stay:  LOS: 3 days   Subjective: OOB in chair. C/o cough, difficult to cough up mucus.   Objective: Vital signs in last 24 hours: Temp:  [97.7 F (36.5 C)-98.5 F (36.9 C)] 97.7 F (36.5 C) (07/15 0408) Pulse Rate:  [81-119] 96 (07/15 0700) Cardiac Rhythm:  [-] Atrial fibrillation (07/15 0400) Resp:  [19-30] 22 (07/15 0700) BP: (87-125)/(35-70) 103/55 mmHg (07/15 0700) SpO2:  [91 %-97 %] 96 % (07/15 0700) Arterial Line BP: (96-139)/(42-56) 105/44 mmHg (07/15 0700)  Filed Weights   05/10/15 0606  Weight: 157 lb (71.215 kg)    Weight change:    Hemodynamic parameters for last 24 hours: CVP:  [5 mmHg-7 mmHg] 7 mmHg  Intake/Output from previous day: 07/14 0701 - 07/15 0700 In: 3694.9 [I.V.:3554.9; NG/GT:140] Out: 2265 [Urine:855; Emesis/NG output:450; Chest Tube:960]  CBGs 171-161-115-143-178    Current Meds: Scheduled Meds: . albumin human  12.5 g Intravenous Once  . antiseptic oral rinse  7 mL Mouth Rinse QID  . chlorhexidine  15 mL Mouth Rinse BID  . enoxaparin (LOVENOX) injection  30 mg Subcutaneous Q24H  . fentaNYL   Intravenous 6 times per day  . insulin aspart  0-24 Units Subcutaneous 6 times per day  . insulin detemir  10 Units Subcutaneous Daily  . levalbuterol  0.63 mg Nebulization Q6H  . metoCLOPramide (REGLAN) injection  10 mg Intravenous 4 times per day  . metoprolol  5 mg Intravenous Q12H  . pantoprazole (PROTONIX) IV  40 mg Intravenous Q12H  . sodium chloride  10 mL Intravenous Q12H   Continuous Infusions: . 0.45 % NaCl with KCl 20 mEq / L 100 mL/hr at 05/13/15 0000  . amiodarone 30 mg/hr (05/13/15 0208)  . feeding supplement (VITAL AF 1.2 CAL) 1,000 mL (05/12/15  1600)  . lactated ringers    . phenylephrine (NEO-SYNEPHRINE) Adult infusion 15 mcg/min (05/12/15 2300)   PRN Meds:.Place/Maintain arterial line **AND** sodium chloride, diphenhydrAMINE, fentaNYL (SUBLIMAZE) injection, hydrALAZINE, naloxone **AND** sodium chloride, ondansetron (ZOFRAN) IV, potassium chloride  Physical Exam: General appearance: alert, cooperative and no distress Heart: irregularly irregular rhythm Lungs: Coarse bilateral BS Abdomen: Soft, NT/ND, no BS yet Extremities: Mild LE edema Wound: Dressed and dry  Lab Results: CBC: Recent Labs  05/12/15 0445 05/13/15 0445  WBC 19.5* 17.6*  HGB 12.3 11.3*  HCT 35.7* 32.4*  PLT 136* 120*   BMET:  Recent Labs  05/12/15 0445 05/13/15 0445  NA 130* 125*  K 4.8 4.5  CL 104 101  CO2 19* 17*  GLUCOSE 190* 178*  BUN 16 18  CREATININE 1.40* 1.07*  CALCIUM 8.0* 7.7*    PT/INR:  Recent Labs  05/12/15 0445  LABPROT 17.0*  INR 1.37   Radiology: Dg Chest Port 1 View  05/13/2015   CLINICAL DATA:  Esophageal resection.  EXAM: PORTABLE CHEST - 1 VIEW  COMPARISON:  05/12/2015.  FINDINGS: Right IJ line, NG tube, bilateral chest tubes in stable position. No pneumothorax. Stable cardiomegaly. Mild progression of bibasilar atelectasis and/or infiltrates. No prominent pleural effusion.  IMPRESSION: 1. Lines and tubes including  bilateral chest tubes in stable position. No pneumothorax. 2. Stable cardiomegaly . 3. Slight progression of bibasilar atelectasis and/or infiltrates.   Electronically Signed   By: Marcello Moores  Register   On: 05/13/2015 07:34     Assessment/Plan: S/P Procedure(s) (LRB): VIDEO BRONCHOSCOPY (N/A) TRANSHIATAL TOTAL ESOPHAGEAL RESECTION (N/A) FEEDING JEJUNOSTOMY (N/A)  CV- AF/flutter this am, on Amiodarone gtt.  Rates 90-100s.  Will consult cardiology for further input as patient has a prior Price/o flutter for which she was scheduled to undergo outpatient cardioversion, but spontaneously converted to sinus before  the procedure.  BPs stable, wean Neo as tolerated.  GI- tolerating TFs. Will increase rate.  Hyponatremia- will change IVF and decrease rate.  Vol overload- diurese.  AKI- Cr trending down. Continue to monitor.  Continue CTs, Foley for now.  DM- CBGs fairly stable, continue low dose Levemir.   Sharon Price,Sharon Price 05/13/2015 7:57 AM   returned to normal cr Needs to mobilize Slowly increase tube feeding and decrease iv fluid as tube feeding tolerated,  No drainage from the neck drain  Path noted: Pathologic Staging: ypT3, ypN0.

## 2015-05-13 NOTE — Progress Notes (Signed)
TCTS BRIEF SICU PROGRESS NOTE  3 Days Post-Op  S/P Procedure(s) (LRB): VIDEO BRONCHOSCOPY (N/A) TRANSHIATAL TOTAL ESOPHAGEAL RESECTION (N/A) FEEDING JEJUNOSTOMY (N/A)   Stable day Maintaining NSR w/ stable BP UOP adequate  Plan: Continue current plan  Rexene Alberts 05/13/2015 9:31 PM

## 2015-05-13 NOTE — Progress Notes (Signed)
Rehab Admissions Coordinator Note:  Patient was screened by Cleatrice Burke for appropriateness for an Inpatient Acute Rehab Consult per PT recommendation.   At this time, we are recommending await further progress over the wekend. Likely noted to improve mobility once lines, etc are removed.. I will follow.  Cleatrice Burke 05/13/2015, 3:23 PM  I can be reached at [phone].

## 2015-05-13 NOTE — Evaluation (Signed)
Physical Therapy Evaluation Patient Details Name: Sharon Price MRN: [id number] DOB: Nov 29, 1945 Today's Date: 05/13/2015   History of Present Illness  Pt is a 69 y/o female with a PMH of high cholesterol, DM, HTN, PE, and esophageal cancer. She was admitted s/p esophageal resection on 05/11/15.   Clinical Impression  Pt admitted with above diagnosis. Pt currently with functional limitations due to the deficits listed below (see PT Problem List). At the time of PT eval pt was able to perform transfers with at least +3 assist. Pt with multiple lines and +2 was required for hands-on assist during transfer, and RN was present for management of lines/tubes. Anticipate that as lines decrease, pt's function with improve drastically. Recommending a CIR consult as pt was independent PTA and wishes to return to PLOF as soon as possible. Pt will benefit from skilled PT to increase their independence and safety with mobility to allow discharge to the venue listed below.       Follow Up Recommendations CIR;Supervision/Assistance - 24 hour    Equipment Recommendations  None recommended by PT    Recommendations for Other Services Rehab consult;OT consult     Precautions / Restrictions Precautions Precautions: Fall Precaution Comments: 2 chest tubes, NG tube, Drain LUQ, foley catheter, drain in L neck, abdominal incision, arterial line, right IJ line Restrictions Weight Bearing Restrictions: No      Mobility  Bed Mobility Overal bed mobility: Needs Assistance;+2 for physical assistance Bed Mobility: Sit to Sidelying;Rolling Rolling: Min assist       Sit to sidelying: Mod assist;+2 for physical assistance General bed mobility comments: Assist for elevation of LE's onto bed. Pt was cued to lean onto elbow and pt was assisted to sidelying, and then supine.   Transfers Overall transfer level: Needs assistance Equipment used: 2 person hand held assist Transfers: Sit to/from [employer] Sit to Stand: Mod assist;+2 physical assistance;+2 safety/equipment Stand pivot transfers: Mod assist;+2 physical assistance;+2 safety/equipment       General transfer comment: +2 required for hands-on assist with the patient. 2 RN's present to manage IV pole, multiple lines, and 2 chest tubes during transfer.   Ambulation/Gait                Stairs            Wheelchair Mobility    Modified Rankin (Stroke Patients Only)       Balance Overall balance assessment: Needs assistance Sitting-balance support: Feet supported;No upper extremity supported Sitting balance-Leahy Scale: Fair     Standing balance support: Bilateral upper extremity supported;During functional activity Standing balance-Leahy Scale: Poor                               Pertinent Vitals/Pain Pain Assessment: Faces Faces Pain Scale: Hurts little more Pain Location: Abdominal Pain Descriptors / Indicators: Operative site guarding;Grimacing Pain Intervention(s): Limited activity within patient's tolerance;Monitored during session;Repositioned    Home Living Family/patient expects to be discharged to:: Private residence Living Arrangements: Children Available Help at Discharge: Family;Available 24 hours/day (Per pt; not confirmed with son) Type of Home: House Home Access: Stairs to enter   [employer] of Steps: 3 Home Layout: One level Home Equipment: Walker - 2 wheels;Cane - single point;Wheelchair - manual      Prior Function Level of Independence: Independent               Hand Dominance  Extremity/Trunk Assessment   Upper Extremity Assessment: Defer to OT evaluation           Lower Extremity Assessment: Generalized weakness      Cervical / Trunk Assessment: Normal  Communication   Communication: No difficulties  Cognition Arousal/Alertness: Awake/alert Behavior During Therapy: WFL for tasks assessed/performed Overall  Cognitive Status: Within Functional Limits for tasks assessed                      General Comments      Exercises        Assessment/Plan    PT Assessment Patient needs continued PT services  PT Diagnosis Difficulty walking;Generalized weakness;Acute pain   PT Problem List Decreased strength;Decreased range of motion;Decreased activity tolerance;Decreased balance;Decreased mobility;Decreased knowledge of use of DME;Decreased safety awareness;Decreased knowledge of precautions;Pain  PT Treatment Interventions DME instruction;Gait training;Stair training;Functional mobility training;Therapeutic activities;Therapeutic exercise;Neuromuscular re-education;Patient/family education   PT Goals (Current goals can be found in the Care Plan section) Acute Rehab PT Goals Patient Stated Goal: Return to PLOF PT Goal Formulation: With patient Time For Goal Achievement: 05/27/15 Potential to Achieve Goals: Good    Frequency Min 3X/week   Barriers to discharge        Co-evaluation               End of Session Equipment Utilized During Treatment: Gait belt (Under arm pits for safety. Chest tubes under breasts) Activity Tolerance: Patient limited by fatigue;Patient limited by pain Patient left: in bed;with call bell/phone within reach;with nursing/sitter in room Nurse Communication: Mobility status (RN present during session)         Time: 2010-0712 PT Time Calculation (min) (ACUTE ONLY): 18 min   Charges:   PT Evaluation $Initial PT Evaluation Tier I: 1 Procedure     PT G Codes:        Rolinda Roan 2015-05-20, 2:11 PM   Rolinda Roan, PT, DPT Acute Rehabilitation Services Pager: [phone]

## 2015-05-13 NOTE — Consult Note (Signed)
CARDIOLOGY CONSULT NOTE       Patient ID: Sharon Price MRN: [id number] DOB/AGE: [birth year] 69 y.o.  Admit date: 05/10/2015 Referring Physician:  Servando Snare Primary Physician: Lillard Anes, MD Primary Cardiologist:  Johnsie Cancel Reason for Consultation: Atrial flutter  Active Problems:   Esophageal cancer   HPI:  69 y.o. postop esophageal resection.  Last seen by me 03/30/15 for preop clearance.  Noted to be in rapid flutter.  She has been battling esophageal cancer over the past year  She is a smoker with Elevated cholesterol DM and HTN. Had a PE last year and has been on xarelto No documented CAD. PET scan 03/24/15 reviewed and no evidence of metastatic disease Now 3 days postop She was scheduled for [facility] on anticoagulation but converted spontaneiously.  Went back into aflutter yesterday.  Rate in 90-100 range on iv amiodarone. No specific cardiac complaints.  Still with NG tube , NPO, two chest tubes and triple lumen in RIJ.  On DVT lovenox.  Nurses indicate nothing orally.  Preop echo showed normal EF 55-60% no atrial enlargement and mild MR/AR No history of CAD and preop myovue not needed    ROS All other systems reviewed and negative except as noted above  Past Medical History  Diagnosis Date  . Chronic kidney disease     renal..STAGE 2  . Hypertension   . Mechanical dysphagia     MOSTLY SOLIDS  . Pulmonary emboli 9/223/15 [place]    RIGHT LOWER LOBE PER CT CHEST   . Patient on combined chemotherapy and radiation     [facility]  . Cancer 03/30/14    GE JUNCTION  . Transfusion history     ?[facility].  . Family history of adverse reaction to anesthesia     Patients sister is very nauseated after anesthesia  . Pneumonia   . Diabetes mellitus without complication     Type 2  . Hyperlipemia   . GERD (gastroesophageal reflux disease)     Family History  Problem Relation Age of Onset  . Diabetes Mother    . Hypertension Mother   . Stroke Mother   . Heart disease Mother   . Hypertension Father   . Diabetes Sister   . Cancer Sister     THYROID  . Cancer Brother     LUNG  . Diabetes Daughter   . Diabetes Son     History   Social History  . Marital Status: Widowed    Spouse Name: N/A  . Number of Children: N/A  . Years of Education: N/A   Occupational History  . Not on file.   Social History Main Topics  . Smoking status: Former Smoker -- 1.00 packs/day for 20 years    Quit date: 03/02/2015  . Smokeless tobacco: Not on file     Comment: 3 weeks- < 1ppd.  . Alcohol Use: No  . Drug Use: No  . Sexual Activity: Not on file   Other Topics Concern  . Not on file   Social History Narrative    Past Surgical History  Procedure Laterality Date  . Back surgery    . Abdominal hysterectomy    . Tubal ligation    . Eus N/A 04/08/2014    Procedure: ESOPHAGEAL ENDOSCOPIC ULTRASOUND (EUS) RADIAL;  Surgeon: Milus Banister, MD;  Location: [facility];  Service: Endoscopy;  Laterality: N/A;  . Appendectomy    . Spine surgery    . Lumbar  fusion    . Esophagogastroduodenoscopy (egd) with propofol N/A 12/02/2014    Procedure: ESOPHAGOGASTRODUODENOSCOPY (EGD) WITH PROPOFOL;  Surgeon: Jerene Bears, MD;  Location: [facility];  Service: Endoscopy;  Laterality: N/A;  . Eus N/A 03/31/2015    Procedure: UPPER ENDOSCOPIC ULTRASOUND (EUS) RADIAL;  Surgeon: Milus Banister, MD;  Location: [facility];  Service: Endoscopy;  Laterality: N/A;  . Cataract extraction Bilateral   . Video bronchoscopy N/A 05/10/2015    Procedure: VIDEO BRONCHOSCOPY;  Surgeon: Grace Isaac, MD;  Location: [place];  Service: Thoracic;  Laterality: N/A;  . Complete esophagectomy N/A 05/10/2015    Procedure: TRANSHIATAL TOTAL ESOPHAGEAL RESECTION;  Surgeon: Grace Isaac, MD;  Location: [place];  Service: Thoracic;  Laterality: N/A;  . Jejunostomy N/A 05/10/2015    Procedure: FEEDING JEJUNOSTOMY;  Surgeon: Grace Isaac, MD;  Location: [place];  Service: Thoracic;  Laterality: N/A;     . albumin human  12.5 g Intravenous Once  . antiseptic oral rinse  7 mL Mouth Rinse QID  . chlorhexidine  15 mL Mouth Rinse BID  . enoxaparin (LOVENOX) injection  30 mg Subcutaneous Q24H  . fentaNYL   Intravenous 6 times per day  . furosemide      . insulin aspart  0-24 Units Subcutaneous 6 times per day  . insulin detemir  10 Units Subcutaneous Daily  . levalbuterol  0.63 mg Nebulization Q6H  . metoCLOPramide (REGLAN) injection  10 mg Intravenous 4 times per day  . metoprolol  5 mg Intravenous Q12H  . pantoprazole (PROTONIX) IV  40 mg Intravenous Q12H  . sodium chloride  10 mL Intravenous Q12H   . 0.45 % NaCl with KCl 20 mEq / L 100 mL/hr at 05/13/15 0000  . amiodarone 30 mg/hr (05/13/15 0208)  . feeding supplement (VITAL AF 1.2 CAL)    . lactated ringers    . phenylephrine (NEO-SYNEPHRINE) Adult infusion 15 mcg/min (05/13/15 4431)    Physical Exam: Blood pressure 103/55, pulse 96, temperature 97.5 F (36.4 C), temperature source Oral, resp. rate 22, height 5\' 2"  (1.575 m), weight 71.215 kg (157 lb), SpO2 97 %.   Affect appropriate Chronically ill frail female  HEENT: normal Neck supple with no adenopathy JVP normal no bruits no thyromegaly Right IJ triple lumen Lungs clear with no wheezing and good diaphragmatic motion Heart:  S1/S2 no murmur, no rub, gallop or click PMI normal Post thoracic surgery with NG tube and two chest tubes  Abdomen: benighn, BS negative , no tenderness, no AAA no bruit.  No HSM or HJR Distal pulses intact with no bruits No edema Neuro non-focal Skin warm and dry No muscular weakness   Labs:   Lab Results  Component Value Date   WBC 17.6* 05/13/2015   HGB 11.3* 05/13/2015   HCT 32.4* 05/13/2015   MCV 85.9 05/13/2015   PLT 120* 05/13/2015    Recent Labs Lab 05/13/15 0445  NA 125*  K 4.5  CL 101  CO2 17*  BUN 18  CREATININE 1.07*  CALCIUM 7.7*  GLUCOSE  178*      Radiology: Dg Chest 2 View  05/06/2015   CLINICAL DATA:  Preoperative examination prior to surgery for esophageal malignancy ; history of pulmonary embolism and diabetes, discontinued smoking 2 months ago.  EXAM: CHEST  2 VIEW  COMPARISON:  PA and lateral chest x-ray of March 30, 2015  FINDINGS: The lungs are adequately inflated. The interstitial markings are coarse bilaterally but are stable.  There are no pulmonary parenchymal nodules or masses nor alveolar infiltrates. The heart and pulmonary vascularity are normal. The mediastinum is normal in width. There is no pleural effusion. 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 The patient has undergone posterior lumbar fusion.  IMPRESSION: Chronically increased interstitial markings are consistent with the patient's smoking history. There is no active cardiopulmonary disease.   Electronically Signed   By: David  Martinique M.D.   On: 05/06/2015 09:18   Dg Chest Port 1 View  05/13/2015   CLINICAL DATA:  Esophageal resection.  EXAM: PORTABLE CHEST - 1 VIEW  COMPARISON:  05/12/2015.  FINDINGS: Right IJ line, NG tube, bilateral chest tubes in stable position. No pneumothorax. Stable cardiomegaly. Mild progression of bibasilar atelectasis and/or infiltrates. No prominent pleural effusion.  IMPRESSION: 1. Lines and tubes including bilateral chest tubes in stable position. No pneumothorax. 2. Stable cardiomegaly . 3. Slight progression of bibasilar atelectasis and/or infiltrates.   Electronically Signed   By: Marcello Moores  Register   On: 05/13/2015 07:34   Dg Chest Port 1 View  05/12/2015   CLINICAL DATA:  Esophageal adenocarcinoma postop resection  EXAM: PORTABLE CHEST - 1 VIEW  COMPARISON:  05/11/2015  FINDINGS: NG tube remains in place extending below the diaphragm. Bilateral chest tubes in place. No pneumothorax. Right jugular central venous catheter tip at the cavoatrial junction.  Improved aeration with decrease in  bibasilar atelectasis and slightly increased lung volume compared with the prior study.  IMPRESSION: Mild improvement in aeration in the lung bases compared with the prior study. No pneumothorax.   Electronically Signed   By: Franchot Gallo M.D.   On: 05/12/2015 07:42   Dg Chest Port 1 View  05/11/2015   CLINICAL DATA:  Status post esophagectomy  EXAM: PORTABLE CHEST - 1 VIEW  COMPARISON:  Chest x-ray of May 10, 2015  FINDINGS: The lungs remain mildly hypoinflated. Bilateral chest tubes are unchanged in position. There has been interval extubation of the trachea. Small amounts of pleural fluid at the lung bases are suspected. The cardiac silhouette is mildly enlarged. The central pulmonary vascularity is prominent and slightly more conspicuous today. The esophagogastric tube and the right internal jugular catheters remain.  IMPRESSION: 1. Interval extubation of the trachea. There is persistent bilateral hypo inflation and slight interval increased prominence of the pulmonary interstitium which may reflect mild edema. Small bilateral pleural effusions are present. There is no pneumothorax. 2. The remaining support tubes are in stable position.   Electronically Signed   By: David  Martinique M.D.   On: 05/11/2015 07:20   Dg Chest Port 1 View  05/10/2015   CLINICAL DATA:  Postop esophagectomy, esophageal cancer  EXAM: PORTABLE CHEST - 1 VIEW  COMPARISON:  05/06/2015  FINDINGS: Endotracheal tube 4.5 cm above the carina. NG tube in the midline extending below the hemidiaphragms. Right IJ central line tip at the lower SVC level. Bilateral chest tubes in place. No pneumothorax or large effusion. Left lower lobe collapse/ consolidation noted obscuring the left hemidiaphragm. Minor right base atelectasis.  IMPRESSION: Support apparatus in good position.  No significant pneumothorax or large effusion  Left greater the right basilar atelectasis.   Electronically Signed   By: Jerilynn Mages.  Shick M.D.   On: 05/10/2015 19:04    EKG:  7/8 SR PR 222 nonspecific ST changes poor R wave progression Telemetry: reviewed atrial fib/flutter rates 90-100   ASSESSMENT AND PLAN:  PAF:  Continue iv amiodarone.  BP soft no  need for rate control drug now would have to be iv.  Change to systemic anticoagulation when ok with surgery and tubes out Just on DVT lovenox now.  May be a candidate for flutter ablation down the road.  Check ECG  Will follow in hospital  Esophageal Cancer:  Progressing still NPO working with PT no BS with NG tube in iv protonix and tube feeds  DM:  SS insulin as needed   Signed: Jenkins Rouge 05/13/2015, 9:52 AM

## 2015-05-14 ENCOUNTER — Inpatient Hospital Stay (HOSPITAL_COMMUNITY): Payer: Commercial Managed Care - PPO

## 2015-05-14 DIAGNOSIS — I4892 Unspecified atrial flutter: Secondary | ICD-10-CM

## 2015-05-14 LAB — CBC
HCT: 27.7 % — ABNORMAL LOW (ref 36.0–46.0)
Hemoglobin: 9.9 g/dL — ABNORMAL LOW (ref 12.0–15.0)
MCH: 30.8 pg (ref 26.0–34.0)
MCHC: 35.7 g/dL (ref 30.0–36.0)
MCV: 86.3 fL (ref 78.0–100.0)
PLATELETS: 109 10*3/uL — AB (ref 150–400)
RBC: 3.21 MIL/uL — ABNORMAL LOW (ref 3.87–5.11)
RDW: 14.4 % (ref 11.5–15.5)
WBC: 10.9 10*3/uL — AB (ref 4.0–10.5)

## 2015-05-14 LAB — BASIC METABOLIC PANEL
Anion gap: 7 (ref 5–15)
BUN: 16 mg/dL (ref 6–20)
CO2: 21 mmol/L — AB (ref 22–32)
Calcium: 7.5 mg/dL — ABNORMAL LOW (ref 8.9–10.3)
Chloride: 100 mmol/L — ABNORMAL LOW (ref 101–111)
Creatinine, Ser: 1.04 mg/dL — ABNORMAL HIGH (ref 0.44–1.00)
GFR calc Af Amer: >60 mL/min (ref >60)
GFR, EST NON AFRICAN AMERICAN: 54 mL/min — AB (ref >60)
Glucose, Bld: 179 mg/dL — ABNORMAL HIGH (ref 65–99)
Potassium: 3.9 mmol/L (ref 3.5–5.1)
SODIUM: 128 mmol/L — AB (ref 135–145)

## 2015-05-14 LAB — GLUCOSE, CAPILLARY
Glucose-Capillary: 116 mg/dL — ABNORMAL HIGH (ref 65–99)
Glucose-Capillary: 121 mg/dL — ABNORMAL HIGH (ref 65–99)
Glucose-Capillary: 127 mg/dL — ABNORMAL HIGH (ref 65–99)
Glucose-Capillary: 144 mg/dL — ABNORMAL HIGH (ref 65–99)
Glucose-Capillary: 155 mg/dL — ABNORMAL HIGH (ref 65–99)
Glucose-Capillary: 165 mg/dL — ABNORMAL HIGH (ref 65–99)

## 2015-05-14 LAB — BRAIN NATRIURETIC PEPTIDE: B Natriuretic Peptide: 275.3 pg/mL — ABNORMAL HIGH (ref 0.0–100.0)

## 2015-05-14 MED ORDER — SODIUM CHLORIDE 0.9 % IV SOLN
INTRAVENOUS | Status: DC
  Administered 2015-05-14 – 2015-05-27 (×3): via INTRAVENOUS

## 2015-05-14 MED ORDER — POTASSIUM CHLORIDE 10 MEQ/50ML IV SOLN
10.0000 meq | INTRAVENOUS | Status: AC
  Administered 2015-05-14 (×3): 10 meq via INTRAVENOUS
  Filled 2015-05-14 (×2): qty 50

## 2015-05-14 MED ORDER — FUROSEMIDE 10 MG/ML IJ SOLN
20.0000 mg | Freq: Once | INTRAMUSCULAR | Status: AC
  Administered 2015-05-14: 20 mg via INTRAVENOUS
  Filled 2015-05-14: qty 2

## 2015-05-14 NOTE — Progress Notes (Signed)
[facility] BRIEF SICU PROGRESS NOTE  4 Days Post-Op  S/P Procedure(s) (LRB): VIDEO BRONCHOSCOPY (N/A) TRANSHIATAL TOTAL ESOPHAGEAL RESECTION (N/A) FEEDING JEJUNOSTOMY (N/A)   Stable day  Plan: Continue current plan  Rexene Alberts 05/14/2015 6:27 PM

## 2015-05-14 NOTE — Progress Notes (Signed)
SUBJECTIVE:  Sitting up in chair  OBJECTIVE:   Vitals:   Filed Vitals:   05/14/15 0700 05/14/15 0800 05/14/15 0823 05/14/15 0830  BP:  138/51    Pulse: 87 86    Temp:    97.5 F (36.4 C)  TempSrc:    Oral  Resp: 16 16    Height:      Weight:      SpO2: 95% 96% 95%    I&O's:   Intake/Output Summary (Last 24 hours) at 05/14/15 1053 Last data filed at 05/14/15 0900  Gross per 24 hour  Intake 2387.4 ml  Output   2820 ml  Net -432.6 ml   TELEMETRY: Reviewed telemetry pt in NSR:     PHYSICAL EXAM General: Well developed, well nourished, in no acute distress Lungs:   Coarse rhonchi anteriorly Heart:   HRRR S1 S2 Pulses are 2+ & equal.es  Extremities:   No clubbing, cyanosis or edema.  DP +1 Neuro: Alert and oriented X 3. Psych:  Good affect, responds appropriately   LABS: Basic Metabolic Panel:  Recent Labs  05/13/15 0445 05/14/15 0500  NA 125* 128*  K 4.5 3.9  CL 101 100*  CO2 17* 21*  GLUCOSE 178* 179*  BUN 18 16  CREATININE 1.07* 1.04*  CALCIUM 7.7* 7.5*   Liver Function Tests: No results for input(s): AST, ALT, ALKPHOS, BILITOT, PROT, ALBUMIN in the last 72 hours. No results for input(s): LIPASE, AMYLASE in the last 72 hours. CBC:  Recent Labs  05/13/15 0445 05/14/15 0500  WBC 17.6* 10.9*  HGB 11.3* 9.9*  HCT 32.4* 27.7*  MCV 85.9 86.3  PLT 120* 109*   Cardiac Enzymes: No results for input(s): CKTOTAL, CKMB, CKMBINDEX, TROPONINI in the last 72 hours. BNP: Invalid input(s): POCBNP D-Dimer: No results for input(s): DDIMER in the last 72 hours. Hemoglobin A1C: No results for input(s): HGBA1C in the last 72 hours. Fasting Lipid Panel: No results for input(s): CHOL, HDL, LDLCALC, TRIG, CHOLHDL, LDLDIRECT in the last 72 hours. Thyroid Function Tests: No results for input(s): TSH, T4TOTAL, T3FREE, THYROIDAB in the last 72 hours.  Invalid input(s): FREET3 Anemia Panel: No results for input(s): VITAMINB12, FOLATE, FERRITIN, TIBC, IRON,  RETICCTPCT in the last 72 hours. Coag Panel:   Lab Results  Component Value Date   INR 1.37 05/12/2015   INR 1.04 05/06/2015    RADIOLOGY: Dg Chest 2 View  05/06/2015   CLINICAL DATA:  Preoperative examination prior to surgery for esophageal malignancy ; history of pulmonary embolism and diabetes, discontinued smoking 2 months ago.  EXAM: CHEST  2 VIEW  COMPARISON:  PA and lateral chest x-ray of March 30, 2015  FINDINGS: The lungs are adequately inflated. The interstitial markings are coarse bilaterally but are stable. There are no pulmonary parenchymal nodules or masses nor alveolar infiltrates. The heart and pulmonary vascularity are normal. The mediastinum is normal in width. There is no pleural effusion. 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 The patient has undergone posterior lumbar fusion.  IMPRESSION: Chronically increased interstitial markings are consistent with the patient's smoking history. There is no active cardiopulmonary disease.   Electronically Signed   By: David  Martinique M.D.   On: 05/06/2015 09:18   Dg Chest Port 1 View  05/14/2015   CLINICAL DATA:  Esophageal abnormality.  EXAM: PORTABLE CHEST - 1 VIEW  COMPARISON:  05/13/2015  FINDINGS: Right IJ central line tip overlies the level of superior vena cava. Bilateral chest  tubes are identified. Nasogastric tube tip overlies the level of the gastroesophageal junction.  Heart is enlarged. There are patchy densities within the lungs bilaterally, largely unchanged. Suspect bilateral pleural effusions. There is no pneumothorax.  IMPRESSION: 1. Persistent bilateral infiltrates. 2. No pneumothorax.   Electronically Signed   By: Nolon Nations M.D.   On: 05/14/2015 07:44   Dg Chest Port 1 View  05/13/2015   CLINICAL DATA:  Esophageal resection.  EXAM: PORTABLE CHEST - 1 VIEW  COMPARISON:  05/12/2015.  FINDINGS: Right IJ line, NG tube, bilateral chest tubes in stable position. No pneumothorax. Stable  cardiomegaly. Mild progression of bibasilar atelectasis and/or infiltrates. No prominent pleural effusion.  IMPRESSION: 1. Lines and tubes including bilateral chest tubes in stable position. No pneumothorax. 2. Stable cardiomegaly . 3. Slight progression of bibasilar atelectasis and/or infiltrates.   Electronically Signed   By: Marcello Moores  Register   On: 05/13/2015 07:34   Dg Chest Port 1 View  05/12/2015   CLINICAL DATA:  Esophageal adenocarcinoma postop resection  EXAM: PORTABLE CHEST - 1 VIEW  COMPARISON:  05/11/2015  FINDINGS: NG tube remains in place extending below the diaphragm. Bilateral chest tubes in place. No pneumothorax. Right jugular central venous catheter tip at the cavoatrial junction.  Improved aeration with decrease in bibasilar atelectasis and slightly increased lung volume compared with the prior study.  IMPRESSION: Mild improvement in aeration in the lung bases compared with the prior study. No pneumothorax.   Electronically Signed   By: Franchot Gallo M.D.   On: 05/12/2015 07:42   Dg Chest Port 1 View  05/11/2015   CLINICAL DATA:  Status post esophagectomy  EXAM: PORTABLE CHEST - 1 VIEW  COMPARISON:  Chest x-ray of May 10, 2015  FINDINGS: The lungs remain mildly hypoinflated. Bilateral chest tubes are unchanged in position. There has been interval extubation of the trachea. Small amounts of pleural fluid at the lung bases are suspected. The cardiac silhouette is mildly enlarged. The central pulmonary vascularity is prominent and slightly more conspicuous today. The esophagogastric tube and the right internal jugular catheters remain.  IMPRESSION: 1. Interval extubation of the trachea. There is persistent bilateral hypo inflation and slight interval increased prominence of the pulmonary interstitium which may reflect mild edema. Small bilateral pleural effusions are present. There is no pneumothorax. 2. The remaining support tubes are in stable position.   Electronically Signed   By: David   Martinique M.D.   On: 05/11/2015 07:20   Dg Chest Port 1 View  05/10/2015   CLINICAL DATA:  Postop esophagectomy, esophageal cancer  EXAM: PORTABLE CHEST - 1 VIEW  COMPARISON:  05/06/2015  FINDINGS: Endotracheal tube 4.5 cm above the carina. NG tube in the midline extending below the hemidiaphragms. Right IJ central line tip at the lower SVC level. Bilateral chest tubes in place. No pneumothorax or large effusion. Left lower lobe collapse/ consolidation noted obscuring the left hemidiaphragm. Minor right base atelectasis.  IMPRESSION: Support apparatus in good position.  No significant pneumothorax or large effusion  Left greater the right basilar atelectasis.   Electronically Signed   By: Jerilynn Mages.  Shick M.D.   On: 05/10/2015 19:04    ASSESSMENT AND PLAN:  PAF: Maintaining NSR on IV amiodarone. Change to systemic anticoagulation when ok with surgery and tubes out.  Just on DVT lovenox now. May be a candidate for flutter ablation down the road. Check ECG today to follow QTc. Will follow in hospital  Esophageal Cancer: Progressing still NPO working with PT  no BS with NG tube in iv protonix and tube feeds  DM: SS insulin as needed    Sharon Margarita, MD  05/14/2015  10:53 AM

## 2015-05-14 NOTE — Progress Notes (Signed)
      [address]       [place] [address]             [phone]        CARDIOTHORACIC SURGERY PROGRESS NOTE   R4 Days Post-Op Procedure(s) (LRB): VIDEO BRONCHOSCOPY (N/A) TRANSHIATAL TOTAL ESOPHAGEAL RESECTION (N/A) FEEDING JEJUNOSTOMY (N/A)  Subjective: Feels a little better.  Still sore and weak.    Objective: Vital signs: BP Readings from Last 1 Encounters:  05/14/15 117/46   Pulse Readings from Last 1 Encounters:  05/14/15 87   Resp Readings from Last 1 Encounters:  05/14/15 16   Temp Readings from Last 1 Encounters:  05/14/15 97.5 F (36.4 C) Oral    Hemodynamics: CVP:  [6 mmHg-10 mmHg] 10 mmHg  Physical Exam:  Rhythm:   sinus  Breath sounds: shallow  Heart sounds:  RRR  Incisions:  Clean and dry  Abdomen:  Soft, non-distended, non-tender, minimal bowel sounds, no flatus  Extremities:  Warm, well-perfused, swollen  Chest tubes:  Low volume thin serosanguinous output, no air leak   Intake/Output from previous day: 07/15 0701 - 07/16 0700 In: 2615.8 [I.V.:1920.8; NG/GT:515] Out: 3072 [Urine:2360; Emesis/NG output:190; Chest Tube:522] Intake/Output this shift: Total I/O In: 30 [Other:30] Out: 215 [Urine:85; Chest Tube:130]  Lab Results:  CBC: Recent Labs  05/13/15 0445 05/14/15 0500  WBC 17.6* 10.9*  HGB 11.3* 9.9*  HCT 32.4* 27.7*  PLT 120* 109*    BMET:  Recent Labs  05/13/15 0445 05/14/15 0500  NA 125* 128*  K 4.5 3.9  CL 101 100*  CO2 17* 21*  GLUCOSE 178* 179*  BUN 18 16  CREATININE 1.07* 1.04*  CALCIUM 7.7* 7.5*     PT/INR:   Recent Labs  05/12/15 0445  LABPROT 17.0*  INR 1.37    CBG (last 3)   Recent Labs  05/14/15 0026 05/14/15 0426 05/14/15 0827  GLUCAP 116* 155* 165*    ABG    Component Value Date/Time   PHART 7.353 05/11/2015 0356   PCO2ART 37.5 05/11/2015 0356   PO2ART 72.0* 05/11/2015 0356   HCO3 20.8 05/11/2015 0356   TCO2 22 05/11/2015 0356   ACIDBASEDEF 4.0* 05/11/2015  0356   O2SAT 94.0 05/11/2015 0356    CXR: PORTABLE CHEST - 1 VIEW  COMPARISON: 05/13/2015  FINDINGS: Right IJ central line tip overlies the level of superior vena cava. Bilateral chest tubes are identified. Nasogastric tube tip overlies the level of the gastroesophageal junction.  Heart is enlarged. There are patchy densities within the lungs bilaterally, largely unchanged. Suspect bilateral pleural effusions. There is no pneumothorax.  IMPRESSION: 1. Persistent bilateral infiltrates. 2. No pneumothorax.   Electronically Signed  By: Nolon Nations M.D.  On: 05/14/2015 07:44  Assessment/Plan: S/P Procedure(s) (LRB): VIDEO BRONCHOSCOPY (N/A) TRANSHIATAL TOTAL ESOPHAGEAL RESECTION (N/A) FEEDING JEJUNOSTOMY (N/A)  Overall stable POD4 Post-op Afib - now maintaining NSR w/ stable vitals Expected post op acute blood loss anemia, stabe Expected post op volume excess, stable Expected post op atelectasis, stable, needs increased mobility Expected post op ileus on trickle tube feeds Hyponatremia secondary to volume overload, improved   Mobilize  Diuresis  Pulm toilet  Keep foley in place  Keep chest tubes in place until output decreased  Keep NG tube for now  Advance tube feeds once bowel function resolves   Rexene Alberts 05/14/2015 9:41 AM

## 2015-05-15 ENCOUNTER — Inpatient Hospital Stay (HOSPITAL_COMMUNITY): Payer: Commercial Managed Care - PPO

## 2015-05-15 LAB — GLUCOSE, CAPILLARY
Glucose-Capillary: 107 mg/dL — ABNORMAL HIGH (ref 65–99)
Glucose-Capillary: 122 mg/dL — ABNORMAL HIGH (ref 65–99)
Glucose-Capillary: 128 mg/dL — ABNORMAL HIGH (ref 65–99)
Glucose-Capillary: 144 mg/dL — ABNORMAL HIGH (ref 65–99)
Glucose-Capillary: 157 mg/dL — ABNORMAL HIGH (ref 65–99)
Glucose-Capillary: 174 mg/dL — ABNORMAL HIGH (ref 65–99)

## 2015-05-15 LAB — BASIC METABOLIC PANEL
Anion gap: 5 (ref 5–15)
BUN: 13 mg/dL (ref 6–20)
CO2: 24 mmol/L (ref 22–32)
CREATININE: 0.93 mg/dL (ref 0.44–1.00)
Calcium: 7.9 mg/dL — ABNORMAL LOW (ref 8.9–10.3)
Chloride: 104 mmol/L (ref 101–111)
GFR calc Af Amer: >60 mL/min (ref >60)
GFR calc non Af Amer: >60 mL/min (ref >60)
GLUCOSE: 133 mg/dL — AB (ref 65–99)
Potassium: 3.8 mmol/L (ref 3.5–5.1)
Sodium: 133 mmol/L — ABNORMAL LOW (ref 135–145)

## 2015-05-15 LAB — CBC
HEMATOCRIT: 28.2 % — AB (ref 36.0–46.0)
Hemoglobin: 10 g/dL — ABNORMAL LOW (ref 12.0–15.0)
MCH: 30.8 pg (ref 26.0–34.0)
MCHC: 35.5 g/dL (ref 30.0–36.0)
MCV: 86.8 fL (ref 78.0–100.0)
Platelets: 120 10*3/uL — ABNORMAL LOW (ref 150–400)
RBC: 3.25 MIL/uL — ABNORMAL LOW (ref 3.87–5.11)
RDW: 14.5 % (ref 11.5–15.5)
WBC: 10.7 10*3/uL — AB (ref 4.0–10.5)

## 2015-05-15 MED ORDER — FUROSEMIDE 10 MG/ML IJ SOLN
20.0000 mg | Freq: Once | INTRAMUSCULAR | Status: AC
  Administered 2015-05-15: 20 mg via INTRAVENOUS
  Filled 2015-05-15: qty 2

## 2015-05-15 MED ORDER — POTASSIUM CHLORIDE 10 MEQ/50ML IV SOLN
10.0000 meq | INTRAVENOUS | Status: AC
  Administered 2015-05-15 (×3): 10 meq via INTRAVENOUS
  Filled 2015-05-15: qty 50

## 2015-05-15 NOTE — Progress Notes (Signed)
SUBJECTIVE:  Feels a little better  OBJECTIVE:   Vitals:   Filed Vitals:   05/15/15 0741 05/15/15 0800 05/15/15 0900 05/15/15 1000  BP:  123/50 113/47 95/58  Pulse:  95 89 83  Temp: 97.8 F (36.6 C)     TempSrc: Oral     Resp:  21 21 21   Height:      Weight:      SpO2:  95% 95% 95%   I&O's:   Intake/Output Summary (Last 24 hours) at 05/15/15 1127 Last data filed at 05/15/15 1030  Gross per 24 hour  Intake 1394.1 ml  Output   4860 ml  Net -3465.9 ml   TELEMETRY: Reviewed telemetry pt in NSR:     PHYSICAL EXAM General: Well developed, well nourished, in no acute distress Lungs:   Clear bilaterally to auscultation and percussion. Heart:   HRRR S1 S2 Pulses are 2+ & equal. Abdomen: Bowel sounds are positive, abdomen soft and non-tender without masses Extremities:   No clubbing, cyanosis or edema.  DP +1 Neuro: Alert and oriented X 3. Psych:  Good affect, responds appropriately   LABS: Basic Metabolic Panel:  Recent Labs  05/14/15 0500 05/15/15 0345  NA 128* 133*  K 3.9 3.8  CL 100* 104  CO2 21* 24  GLUCOSE 179* 133*  BUN 16 13  CREATININE 1.04* 0.93  CALCIUM 7.5* 7.9*   Liver Function Tests: No results for input(s): AST, ALT, ALKPHOS, BILITOT, PROT, ALBUMIN in the last 72 hours. No results for input(s): LIPASE, AMYLASE in the last 72 hours. CBC:  Recent Labs  05/14/15 0500 05/15/15 0345  WBC 10.9* 10.7*  HGB 9.9* 10.0*  HCT 27.7* 28.2*  MCV 86.3 86.8  PLT 109* 120*   Cardiac Enzymes: No results for input(s): CKTOTAL, CKMB, CKMBINDEX, TROPONINI in the last 72 hours. BNP: Invalid input(s): POCBNP D-Dimer: No results for input(s): DDIMER in the last 72 hours. Hemoglobin A1C: No results for input(s): HGBA1C in the last 72 hours. Fasting Lipid Panel: No results for input(s): CHOL, HDL, LDLCALC, TRIG, CHOLHDL, LDLDIRECT in the last 72 hours. Thyroid Function Tests: No results for input(s): TSH, T4TOTAL, T3FREE, THYROIDAB in the last 72  hours.  Invalid input(s): FREET3 Anemia Panel: No results for input(s): VITAMINB12, FOLATE, FERRITIN, TIBC, IRON, RETICCTPCT in the last 72 hours. Coag Panel:   Lab Results  Component Value Date   INR 1.37 05/12/2015   INR 1.04 05/06/2015    RADIOLOGY: Dg Chest 2 View  05/06/2015   CLINICAL DATA:  Preoperative examination prior to surgery for esophageal malignancy ; history of pulmonary embolism and diabetes, discontinued smoking 2 months ago.  EXAM: CHEST  2 VIEW  COMPARISON:  PA and lateral chest x-ray of March 30, 2015  FINDINGS: The lungs are adequately inflated. The interstitial markings are coarse bilaterally but are stable. There are no pulmonary parenchymal nodules or masses nor alveolar infiltrates. The heart and pulmonary vascularity are normal. The mediastinum is normal in width. There is no pleural effusion. 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 The patient has undergone posterior lumbar fusion.  IMPRESSION: Chronically increased interstitial markings are consistent with the patient's smoking history. There is no active cardiopulmonary disease.   Electronically Signed   By: David  Martinique M.D.   On: 05/06/2015 09:18   Dg Chest Port 1 View  05/15/2015   CLINICAL DATA:  Atelectasis and shortness of breath.  EXAM: PORTABLE CHEST - 1 VIEW  COMPARISON:  05/14/2015  FINDINGS: Right IJ central line tip to the superior vena cava. Bilateral chest tubes. Nasogastric tube is in place, tip to level of the proximal stomach.  Heart is enlarged. There are patchy opacities in the lung bases bilaterally in. There has been some interval improvement in aeration of both lungs. Small left pleural effusion persists. No pneumothorax.  IMPRESSION: 1. Some minimal improvement in aeration of both lung bases. 2. Minimal bibasilar atelectasis or infiltrates, left greater than right. 3. Persistent left pleural effusion.   Electronically Signed   By: Nolon Nations M.D.    On: 05/15/2015 08:27   Dg Chest Port 1 View  05/14/2015   CLINICAL DATA:  Esophageal abnormality.  EXAM: PORTABLE CHEST - 1 VIEW  COMPARISON:  05/13/2015  FINDINGS: Right IJ central line tip overlies the level of superior vena cava. Bilateral chest tubes are identified. Nasogastric tube tip overlies the level of the gastroesophageal junction.  Heart is enlarged. There are patchy densities within the lungs bilaterally, largely unchanged. Suspect bilateral pleural effusions. There is no pneumothorax.  IMPRESSION: 1. Persistent bilateral infiltrates. 2. No pneumothorax.   Electronically Signed   By: Nolon Nations M.D.   On: 05/14/2015 07:44   Dg Chest Port 1 View  05/13/2015   CLINICAL DATA:  Esophageal resection.  EXAM: PORTABLE CHEST - 1 VIEW  COMPARISON:  05/12/2015.  FINDINGS: Right IJ line, NG tube, bilateral chest tubes in stable position. No pneumothorax. Stable cardiomegaly. Mild progression of bibasilar atelectasis and/or infiltrates. No prominent pleural effusion.  IMPRESSION: 1. Lines and tubes including bilateral chest tubes in stable position. No pneumothorax. 2. Stable cardiomegaly . 3. Slight progression of bibasilar atelectasis and/or infiltrates.   Electronically Signed   By: Marcello Moores  Register   On: 05/13/2015 07:34   Dg Chest Port 1 View  05/12/2015   CLINICAL DATA:  Esophageal adenocarcinoma postop resection  EXAM: PORTABLE CHEST - 1 VIEW  COMPARISON:  05/11/2015  FINDINGS: NG tube remains in place extending below the diaphragm. Bilateral chest tubes in place. No pneumothorax. Right jugular central venous catheter tip at the cavoatrial junction.  Improved aeration with decrease in bibasilar atelectasis and slightly increased lung volume compared with the prior study.  IMPRESSION: Mild improvement in aeration in the lung bases compared with the prior study. No pneumothorax.   Electronically Signed   By: Franchot Gallo M.D.   On: 05/12/2015 07:42   Dg Chest Port 1 View  05/11/2015    CLINICAL DATA:  Status post esophagectomy  EXAM: PORTABLE CHEST - 1 VIEW  COMPARISON:  Chest x-ray of May 10, 2015  FINDINGS: The lungs remain mildly hypoinflated. Bilateral chest tubes are unchanged in position. There has been interval extubation of the trachea. Small amounts of pleural fluid at the lung bases are suspected. The cardiac silhouette is mildly enlarged. The central pulmonary vascularity is prominent and slightly more conspicuous today. The esophagogastric tube and the right internal jugular catheters remain.  IMPRESSION: 1. Interval extubation of the trachea. There is persistent bilateral hypo inflation and slight interval increased prominence of the pulmonary interstitium which may reflect mild edema. Small bilateral pleural effusions are present. There is no pneumothorax. 2. The remaining support tubes are in stable position.   Electronically Signed   By: David  Martinique M.D.   On: 05/11/2015 07:20   Dg Chest Port 1 View  05/10/2015   CLINICAL DATA:  Postop esophagectomy, esophageal cancer  EXAM: PORTABLE CHEST - 1 VIEW  COMPARISON:  05/06/2015  FINDINGS: Endotracheal tube  4.5 cm above the carina. NG tube in the midline extending below the hemidiaphragms. Right IJ central line tip at the lower SVC level. Bilateral chest tubes in place. No pneumothorax or large effusion. Left lower lobe collapse/ consolidation noted obscuring the left hemidiaphragm. Minor right base atelectasis.  IMPRESSION: Support apparatus in good position.  No significant pneumothorax or large effusion  Left greater the right basilar atelectasis.   Electronically Signed   By: Jerilynn Mages.  Shick M.D.   On: 05/10/2015 19:04   ASSESSMENT AND PLAN:  PAF: Maintaining NSR on IV amiodarone. Change to systemic anticoagulation when ok with surgery and tubes out. Just on DVT lovenox now. May be a candidate for flutter ablation down the road. Check ECG today to follow QTc. Will follow in hospital  Esophageal Cancer: Progressing with  NG tube in iv protonix and tube feeds  DM: SS insulin as needed      Sharon Margarita, MD  05/15/2015  11:27 AM

## 2015-05-15 NOTE — Progress Notes (Signed)
      [address]       [place] [address]             [phone]        CARDIOTHORACIC SURGERY PROGRESS NOTE   R5 Days Post-Op Procedure(s) (LRB): VIDEO BRONCHOSCOPY (N/A) TRANSHIATAL TOTAL ESOPHAGEAL RESECTION (N/A) FEEDING JEJUNOSTOMY (N/A)  Subjective: No specific complaints.  Not very motivated and using PCA a fair amount.  Breathing comfortably.  + flatus - BM yet  Objective: Vital signs: BP Readings from Last 1 Encounters:  05/15/15 123/50   Pulse Readings from Last 1 Encounters:  05/15/15 95   Resp Readings from Last 1 Encounters:  05/15/15 21   Temp Readings from Last 1 Encounters:  05/15/15 97.8 F (36.6 C) Oral    Hemodynamics:    Physical Exam:  Rhythm:   sinus  Breath sounds: clear  Heart sounds:  RRR  Incisions:  Clean and dry  Abdomen:  Soft, non-distended, non-tender, + BS's  Extremities:  Warm, well-perfused  Chest tubes:  Low volume thin serosanguinous output but still too much output to d/c tubes, no air leak    Intake/Output from previous day: 07/16 0701 - 07/17 0700 In: 1330.8 [I.V.:640.8; NG/GT:600] Out: 5295 [Urine:3685; Emesis/NG output:1050; Chest Tube:560] Intake/Output this shift:    Lab Results:  CBC: Recent Labs  05/14/15 0500 05/15/15 0345  WBC 10.9* 10.7*  HGB 9.9* 10.0*  HCT 27.7* 28.2*  PLT 109* 120*    BMET:  Recent Labs  05/14/15 0500 05/15/15 0345  NA 128* 133*  K 3.9 3.8  CL 100* 104  CO2 21* 24  GLUCOSE 179* 133*  BUN 16 13  CREATININE 1.04* 0.93  CALCIUM 7.5* 7.9*     PT/INR:  No results for input(s): LABPROT, INR in the last 72 hours.  CBG (last 3)   Recent Labs  05/14/15 2017 05/15/15 0035 05/15/15 0347  GLUCAP 121* 144* 122*    ABG    Component Value Date/Time   PHART 7.353 05/11/2015 0356   PCO2ART 37.5 05/11/2015 0356   PO2ART 72.0* 05/11/2015 0356   HCO3 20.8 05/11/2015 0356   TCO2 22 05/11/2015 0356   ACIDBASEDEF 4.0* 05/11/2015 0356   O2SAT 94.0  05/11/2015 0356    CXR: Mild bibasilar atelectasis o/w clear  Assessment/Plan: S/P Procedure(s) (LRB): VIDEO BRONCHOSCOPY (N/A) TRANSHIATAL TOTAL ESOPHAGEAL RESECTION (N/A) FEEDING JEJUNOSTOMY (N/A)  Overall doing fairly well now POD5 Post-op Afib -  maintaining NSR on IV amiodarone w/ stable BP Expected post op acute blood loss anemia, stable Expected post op volume excess, stable - chest tubes still draining serous fluid Expected post op atelectasis, stable, needs increased mobility Expected post op ileus slowly resolving, on trickle tube feeds Hyponatremia secondary to volume overload, resolving History of urinary incontinence w/ cough, stress   Mobilize  Diuresis  Pulm toilet  Keep foley in place  Keep chest tubes in place until output decreased  Keep NG tube for now - possibly d/c tomorrow  Advance tube feeds once bowel function returns  Rexene Alberts 05/15/2015 8:25 AM

## 2015-05-15 NOTE — Progress Notes (Signed)
[facility] BRIEF SICU PROGRESS NOTE  5 Days Post-Op  S/P Procedure(s) (LRB): VIDEO BRONCHOSCOPY (N/A) TRANSHIATAL TOTAL ESOPHAGEAL RESECTION (N/A) FEEDING JEJUNOSTOMY (N/A)   Stable day Ambulated a short distance w/ assistance Maintaining NSR  Plan: Continue current plan  Rexene Alberts 05/15/2015 5:45 PM

## 2015-05-15 NOTE — Progress Notes (Signed)
Utilization review completed.  

## 2015-05-16 DIAGNOSIS — I483 Typical atrial flutter: Secondary | ICD-10-CM

## 2015-05-16 LAB — GLUCOSE, CAPILLARY
Glucose-Capillary: 116 mg/dL — ABNORMAL HIGH (ref 65–99)
Glucose-Capillary: 127 mg/dL — ABNORMAL HIGH (ref 65–99)
Glucose-Capillary: 143 mg/dL — ABNORMAL HIGH (ref 65–99)
Glucose-Capillary: 151 mg/dL — ABNORMAL HIGH (ref 65–99)
Glucose-Capillary: 182 mg/dL — ABNORMAL HIGH (ref 65–99)
Glucose-Capillary: 193 mg/dL — ABNORMAL HIGH (ref 65–99)

## 2015-05-16 LAB — CBC
HCT: 27.4 % — ABNORMAL LOW (ref 36.0–46.0)
Hemoglobin: 9.6 g/dL — ABNORMAL LOW (ref 12.0–15.0)
MCH: 30 pg (ref 26.0–34.0)
MCHC: 35 g/dL (ref 30.0–36.0)
MCV: 85.6 fL (ref 78.0–100.0)
Platelets: 142 10*3/uL — ABNORMAL LOW (ref 150–400)
RBC: 3.2 MIL/uL — ABNORMAL LOW (ref 3.87–5.11)
RDW: 14.7 % (ref 11.5–15.5)
WBC: 10.6 10*3/uL — ABNORMAL HIGH (ref 4.0–10.5)

## 2015-05-16 LAB — BASIC METABOLIC PANEL
Anion gap: 7 (ref 5–15)
BUN: 14 mg/dL (ref 6–20)
CALCIUM: 7.9 mg/dL — AB (ref 8.9–10.3)
CHLORIDE: 101 mmol/L (ref 101–111)
CO2: 26 mmol/L (ref 22–32)
Creatinine, Ser: 0.95 mg/dL (ref 0.44–1.00)
GFR calc Af Amer: >60 mL/min (ref >60)
Glucose, Bld: 182 mg/dL — ABNORMAL HIGH (ref 65–99)
Potassium: 3.7 mmol/L (ref 3.5–5.1)
SODIUM: 134 mmol/L — AB (ref 135–145)

## 2015-05-16 MED ORDER — LEVALBUTEROL HCL 0.63 MG/3ML IN NEBU
0.6300 mg | INHALATION_SOLUTION | Freq: Three times a day (TID) | RESPIRATORY_TRACT | Status: DC
  Administered 2015-05-17 – 2015-05-24 (×24): 0.63 mg via RESPIRATORY_TRACT
  Filled 2015-05-16 (×47): qty 3

## 2015-05-16 MED ORDER — VITAL AF 1.2 CAL PO LIQD
1000.0000 mL | ORAL | Status: DC
  Administered 2015-05-16: 1000 mL
  Filled 2015-05-16 (×3): qty 1000

## 2015-05-16 MED ORDER — ENOXAPARIN SODIUM 30 MG/0.3ML ~~LOC~~ SOLN
30.0000 mg | Freq: Two times a day (BID) | SUBCUTANEOUS | Status: DC
  Administered 2015-05-16 – 2015-06-15 (×61): 30 mg via SUBCUTANEOUS
  Filled 2015-05-16 (×77): qty 0.3

## 2015-05-16 MED ORDER — FUROSEMIDE 10 MG/ML IJ SOLN
40.0000 mg | Freq: Once | INTRAMUSCULAR | Status: AC
  Administered 2015-05-16: 40 mg via INTRAVENOUS
  Filled 2015-05-16: qty 4

## 2015-05-16 MED ORDER — ACETAMINOPHEN 160 MG/5ML PO SOLN
325.0000 mg | ORAL | Status: DC | PRN
  Administered 2015-05-16 – 2015-06-05 (×10): 325 mg via ORAL
  Filled 2015-05-16 (×3): qty 20.3
  Filled 2015-05-16: qty 10.2
  Filled 2015-05-16 (×6): qty 20.3

## 2015-05-16 MED ORDER — OXYCODONE HCL 5 MG/5ML PO SOLN
5.0000 mg | ORAL | Status: DC | PRN
  Administered 2015-05-16 – 2015-06-07 (×49): 5 mg via ORAL
  Filled 2015-05-16 (×52): qty 5

## 2015-05-16 NOTE — Progress Notes (Signed)
DC fentanyl PCA per MD orders; 40 mls wasted with Currie Paris, RN in [place].  Rowe Pavy, RN

## 2015-05-16 NOTE — Progress Notes (Signed)
Physical Therapy Treatment Patient Details Name: Sharon Price MRN: [id number] DOB: Nov 22, 1945 Today's Date: 05/16/2015    History of Present Illness Pt is a 69 y/o female with a PMH of     PT Comments    Pt progressing well. Continues with bil chest tubes with difficulty standing fully upright due to pain. Able to walk 100 ft today with min assist. Anticipate that when she is medically ready to discharge, she will be able to go home with family's assist.   Follow Up Recommendations  Supervision/Assistance - 24 hour;Home health PT (pt reports her son will be with her 24/7)     Equipment Recommendations  None recommended by PT    Recommendations for Other Services OT consult     Precautions / Restrictions Precautions Precautions: Fall Precaution Comments: 2 chest tubes, Drain LUQ, foley catheter, abdominal incision, right IJ line Restrictions Weight Bearing Restrictions: No    Mobility  Bed Mobility Overal bed mobility: Needs Assistance;+2 for physical assistance Bed Mobility: Rolling;Sit to Sidelying Rolling: Supervision       Sit to sidelying: Min assist;+2 for safety/equipment General bed mobility comments: Assist for elevation of LE's onto bed. Pt was cued to lean onto elbow and pt was assisted to sidelying, and then supine.   Transfers Overall transfer level: Needs assistance Equipment used: Pushed w/c Transfers: Sit to/from Stand Sit to Stand: +2 physical assistance;+2 safety/equipment;Min assist         General transfer comment: vc for sequencing/technique; vc for full upright position (pt avoiding due to abd pain)  Ambulation/Gait Ambulation/Gait assistance: Min assist;+2 safety/equipment Ambulation Distance (Feet): 100 Feet Assistive device:  (push w/c) Gait Pattern/deviations: Step-through pattern;Decreased stride length;Trunk flexed Gait velocity: slow Gait velocity interpretation: Below normal speed for age/gender General Gait Details: very  flexed torso, not able to fully correct due to pain; requested return to room due to general fatigue (denied dizziness)   Stairs            Wheelchair Mobility    Modified Rankin (Stroke Patients Only)       Balance     Sitting balance-Leahy Scale: Fair     Standing balance support: Bilateral upper extremity supported Standing balance-Leahy Scale: Poor                      Cognition Arousal/Alertness: Awake/alert Behavior During Therapy: WFL for tasks assessed/performed Overall Cognitive Status: Within Functional Limits for tasks assessed                      Exercises General Exercises - Lower Extremity Ankle Circles/Pumps: AROM;Both;10 reps    General Comments        Pertinent Vitals/Pain Pain Assessment: 0-10 Pain Score: 8  Pain Location: low back Pain Descriptors / Indicators: Aching Pain Intervention(s): Limited activity within patient's tolerance;Monitored during session;Repositioned;Patient requesting pain meds-RN notified    Home Living                      Prior Function            PT Goals (current goals can now be found in the care plan section) Acute Rehab PT Goals Patient Stated Goal: Return to PLOF Time For Goal Achievement: 05/27/15 Progress towards PT goals: Progressing toward goals    Frequency  Min 3X/week    PT Plan Discharge plan needs to be updated    Co-evaluation  End of Session Equipment Utilized During Treatment: Gait belt (Under arm pits for safety. Chest tubes under breasts) Activity Tolerance: Patient limited by fatigue;Patient limited by pain Patient left: in bed;with call bell/phone within reach;with nursing/sitter in room     Time: 2824-1753 PT Time Calculation (min) (ACUTE ONLY): 30 min  Charges:  $Gait Training: 23-37 mins                    G Codes:      Sharon Price 05/25/2015, 11:49 AM Pager [phone]

## 2015-05-16 NOTE — Progress Notes (Signed)
SUBJECTIVE:   Feels much better today. Converted to NSR overnight. HR in the 70's.  OBJECTIVE:   Vitals:   Filed Vitals:   05/16/15 0700 05/16/15 0800 05/16/15 0829 05/16/15 0914  BP: 122/52 103/51    Pulse: 91 82    Temp:   98.3 F (36.8 C)   TempSrc:   Oral   Resp: 24 19    Height:      Weight:      SpO2: 97% 97%  95%   I&O's:    Intake/Output Summary (Last 24 hours) at 05/16/15 0944 Last data filed at 05/16/15 0800  Gross per 24 hour  Intake 1250.7 ml  Output   3135 ml  Net -1884.3 ml   TELEMETRY: Reviewed telemetry pt in NSR:  PHYSICAL EXAM General: Well developed, well nourished, in no acute distress Lungs:   Clear bilaterally to auscultation and percussion. Heart:   HRRR S1 S2 Pulses are 2+ & equal. Abdomen: Bowel sounds are positive, abdomen soft and non-tender without masses Extremities:   No clubbing, cyanosis or edema.  DP +1 Neuro: Alert and oriented X 3. Psych:  Good affect, responds appropriately   LABS: Basic Metabolic Panel:  Recent Labs  05/15/15 0345 05/16/15 0420  NA 133* 134*  K 3.8 3.7  CL 104 101  CO2 24 26  GLUCOSE 133* 182*  BUN 13 14  CREATININE 0.93 0.95  CALCIUM 7.9* 7.9*   Liver Function Tests: No results for input(s): AST, ALT, ALKPHOS, BILITOT, PROT, ALBUMIN in the last 72 hours. No results for input(s): LIPASE, AMYLASE in the last 72 hours. CBC:  Recent Labs  05/15/15 0345 05/16/15 0420  WBC 10.7* 10.6*  HGB 10.0* 9.6*  HCT 28.2* 27.4*  MCV 86.8 85.6  PLT 120* 142*   Cardiac Enzymes: No results for input(s): CKTOTAL, CKMB, CKMBINDEX, TROPONINI in the last 72 hours. BNP: Invalid input(s): POCBNP D-Dimer: No results for input(s): DDIMER in the last 72 hours. Hemoglobin A1C: No results for input(s): HGBA1C in the last 72 hours. Fasting Lipid Panel: No results for input(s): CHOL, HDL, LDLCALC, TRIG, CHOLHDL, LDLDIRECT in the last 72 hours. Thyroid Function Tests: No results for input(s): TSH, T4TOTAL,  T3FREE, THYROIDAB in the last 72 hours.  Invalid input(s): FREET3 Anemia Panel: No results for input(s): VITAMINB12, FOLATE, FERRITIN, TIBC, IRON, RETICCTPCT in the last 72 hours. Coag Panel:   Lab Results  Component Value Date   INR 1.37 05/12/2015   INR 1.04 05/06/2015    RADIOLOGY: Dg Chest 2 View  05/06/2015   CLINICAL DATA:  Preoperative examination prior to surgery for esophageal malignancy ; history of pulmonary embolism and diabetes, discontinued smoking 2 months ago.  EXAM: CHEST  2 VIEW  COMPARISON:  PA and lateral chest x-ray of March 30, 2015  FINDINGS: The lungs are adequately inflated. The interstitial markings are coarse bilaterally but are stable. There are no pulmonary parenchymal nodules or masses nor alveolar infiltrates. The heart and pulmonary vascularity are normal. The mediastinum is normal in width. There is no pleural effusion. 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 The patient has undergone posterior lumbar fusion.  IMPRESSION: Chronically increased interstitial markings are consistent with the patient's smoking history. There is no active cardiopulmonary disease.   Electronically Signed   By: David  Martinique M.D.   On: 05/06/2015 09:18   Dg Chest Port 1 View  05/15/2015   CLINICAL DATA:  Atelectasis and shortness of breath.  EXAM: PORTABLE CHEST -  1 VIEW  COMPARISON:  05/14/2015  FINDINGS: Right IJ central line tip to the superior vena cava. Bilateral chest tubes. Nasogastric tube is in place, tip to level of the proximal stomach.  Heart is enlarged. There are patchy opacities in the lung bases bilaterally in. There has been some interval improvement in aeration of both lungs. Small left pleural effusion persists. No pneumothorax.  IMPRESSION: 1. Some minimal improvement in aeration of both lung bases. 2. Minimal bibasilar atelectasis or infiltrates, left greater than right. 3. Persistent left pleural effusion.   Electronically Signed    By: Nolon Nations M.D.   On: 05/15/2015 08:27   Dg Chest Port 1 View  05/14/2015   CLINICAL DATA:  Esophageal abnormality.  EXAM: PORTABLE CHEST - 1 VIEW  COMPARISON:  05/13/2015  FINDINGS: Right IJ central line tip overlies the level of superior vena cava. Bilateral chest tubes are identified. Nasogastric tube tip overlies the level of the gastroesophageal junction.  Heart is enlarged. There are patchy densities within the lungs bilaterally, largely unchanged. Suspect bilateral pleural effusions. There is no pneumothorax.  IMPRESSION: 1. Persistent bilateral infiltrates. 2. No pneumothorax.   Electronically Signed   By: Nolon Nations M.D.   On: 05/14/2015 07:44   Dg Chest Port 1 View  05/13/2015   CLINICAL DATA:  Esophageal resection.  EXAM: PORTABLE CHEST - 1 VIEW  COMPARISON:  05/12/2015.  FINDINGS: Right IJ line, NG tube, bilateral chest tubes in stable position. No pneumothorax. Stable cardiomegaly. Mild progression of bibasilar atelectasis and/or infiltrates. No prominent pleural effusion.  IMPRESSION: 1. Lines and tubes including bilateral chest tubes in stable position. No pneumothorax. 2. Stable cardiomegaly . 3. Slight progression of bibasilar atelectasis and/or infiltrates.   Electronically Signed   By: Marcello Moores  Register   On: 05/13/2015 07:34   Dg Chest Port 1 View  05/12/2015   CLINICAL DATA:  Esophageal adenocarcinoma postop resection  EXAM: PORTABLE CHEST - 1 VIEW  COMPARISON:  05/11/2015  FINDINGS: NG tube remains in place extending below the diaphragm. Bilateral chest tubes in place. No pneumothorax. Right jugular central venous catheter tip at the cavoatrial junction.  Improved aeration with decrease in bibasilar atelectasis and slightly increased lung volume compared with the prior study.  IMPRESSION: Mild improvement in aeration in the lung bases compared with the prior study. No pneumothorax.   Electronically Signed   By: Franchot Gallo M.D.   On: 05/12/2015 07:42   Dg Chest  Port 1 View  05/11/2015   CLINICAL DATA:  Status post esophagectomy  EXAM: PORTABLE CHEST - 1 VIEW  COMPARISON:  Chest x-ray of May 10, 2015  FINDINGS: The lungs remain mildly hypoinflated. Bilateral chest tubes are unchanged in position. There has been interval extubation of the trachea. Small amounts of pleural fluid at the lung bases are suspected. The cardiac silhouette is mildly enlarged. The central pulmonary vascularity is prominent and slightly more conspicuous today. The esophagogastric tube and the right internal jugular catheters remain.  IMPRESSION: 1. Interval extubation of the trachea. There is persistent bilateral hypo inflation and slight interval increased prominence of the pulmonary interstitium which may reflect mild edema. Small bilateral pleural effusions are present. There is no pneumothorax. 2. The remaining support tubes are in stable position.   Electronically Signed   By: David  Martinique M.D.   On: 05/11/2015 07:20   Dg Chest Port 1 View  05/10/2015   CLINICAL DATA:  Postop esophagectomy, esophageal cancer  EXAM: PORTABLE CHEST - 1 VIEW  COMPARISON:  05/06/2015  FINDINGS: Endotracheal tube 4.5 cm above the carina. NG tube in the midline extending below the hemidiaphragms. Right IJ central line tip at the lower SVC level. Bilateral chest tubes in place. No pneumothorax or large effusion. Left lower lobe collapse/ consolidation noted obscuring the left hemidiaphragm. Minor right base atelectasis.  IMPRESSION: Support apparatus in good position.  No significant pneumothorax or large effusion  Left greater the right basilar atelectasis.   Electronically Signed   By: Jerilynn Mages.  Shick M.D.   On: 05/10/2015 19:04   ASSESSMENT AND PLAN:  PAF: Maintaining NSR on IV amiodarone. Change to systemic anticoagulation when ok with surgery and tubes out.Will follow in hospital  Esophageal Cancer: Progressing with NG tube in iv protonix and tube feeds  DM: SS insulin as needed   Sharon Casino,  MD, [facility] Attending Cardiologist [place], [place]  05/16/2015  9:44 AM

## 2015-05-16 NOTE — Progress Notes (Signed)
Patient ID: Sharon Price, female   DOB: 1946/01/20, 69 y.o.   MRN: [id number] EVENING ROUNDS NOTE :     [address]       [place] [address]             [phone]                 6 Days Post-Op Procedure(s) (LRB): VIDEO BRONCHOSCOPY (N/A) TRANSHIATAL TOTAL ESOPHAGEAL RESECTION (N/A) FEEDING JEJUNOSTOMY (N/A)  Total Length of Stay:  LOS: 6 days  BP 91/50 mmHg  Pulse 80  Temp(Src) 98.3 F (36.8 C) (Oral)  Resp 18  Ht 5\' 2"  (1.575 m)  Wt 171 lb 8.3 oz (77.8 kg)  BMI 31.36 kg/m2  SpO2 96%  .Intake/Output      07/17 0701 - 07/18 0700 07/18 0701 - 07/19 0700   I.V. (mL/kg) 587.4 (7.6) 283.7 (3.6)   Other 150    NG/GT 500 40   IV Piggyback 150 150   Total Intake(mL/kg) 1387.4 (17.8) 473.7 (6.1)   Urine (mL/kg/hr) 2320 (1.2) 750 (0.9)   Emesis/NG output 400 (0.2)    Drains 10 (0)    Chest Tube 470 (0.3) 270 (0.3)   Total Output 3200 1020   Net -1812.6 -546.3        Urine Occurrence  1 x     . sodium chloride 10 mL/hr at 05/16/15 1700  . amiodarone 30 mg/hr (05/16/15 1700)  . feeding supplement (VITAL AF 1.2 CAL) 1,000 mL (05/16/15 1734)     Lab Results  Component Value Date   WBC 10.6* 05/16/2015   HGB 9.6* 05/16/2015   HCT 27.4* 05/16/2015   PLT 142* 05/16/2015   GLUCOSE 182* 05/16/2015   ALT 14 05/06/2015   AST 21 05/06/2015   NA 134* 05/16/2015   K 3.7 05/16/2015   CL 101 05/16/2015   CREATININE 0.95 05/16/2015   BUN 14 05/16/2015   CO2 26 05/16/2015   INR 1.37 05/12/2015   HGBA1C 8.0* 05/10/2015   Ambulated 80 feet today Tolerating ng out, incontinent of urine   Grace Isaac MD  Beeper [phone] Office [phone] 05/16/2015 5:41 PM

## 2015-05-16 NOTE — Progress Notes (Signed)
Patient ID: Sharon Price, female   DOB: 12-03-1945, 69 y.o.   MRN: [id number] TCTS DAILY ICU PROGRESS NOTE                   [address]            [place] [address]          [phone]   6 Days Post-Op Procedure(s) (LRB): VIDEO BRONCHOSCOPY (N/A) TRANSHIATAL TOTAL ESOPHAGEAL RESECTION (N/A) FEEDING JEJUNOSTOMY (N/A)  Total Length of Stay:  LOS: 6 days   Subjective: Feels better today  Objective: Vital signs in last 24 hours: Temp:  [97.8 F (36.6 C)-98.8 F (37.1 C)] 98.1 F (36.7 C) (07/18 0419) Pulse Rate:  [81-96] 91 (07/18 0700) Cardiac Rhythm:  [-] Normal sinus rhythm (07/18 0600) Resp:  [16-30] 24 (07/18 0700) BP: (95-130)/(43-62) 122/52 mmHg (07/18 0700) SpO2:  [92 %-97 %] 97 % (07/18 0700) Weight:  [171 lb 8.3 oz (77.8 kg)] 171 lb 8.3 oz (77.8 kg) (07/18 0600)  Filed Weights   05/14/15 0500 05/15/15 0500 05/16/15 0600  Weight: 171 lb 15.3 oz (78 kg) 170 lb 10.2 oz (77.4 kg) 171 lb 8.3 oz (77.8 kg)    Weight change: 14.1 oz (0.4 kg)   Hemodynamic parameters for last 24 hours:    Intake/Output from previous day: 07/17 0701 - 07/18 0700 In: 1370.7 [I.V.:570.7; NG/GT:500; IV Piggyback:150] Out: 3200 [Urine:2320; Emesis/NG output:400; Drains:10; Chest Tube:470]  Intake/Output this shift:    Current Meds: Scheduled Meds: . antiseptic oral rinse  7 mL Mouth Rinse QID  . chlorhexidine  15 mL Mouth Rinse BID  . enoxaparin (LOVENOX) injection  30 mg Subcutaneous Q12H  . furosemide  40 mg Intravenous Once  . insulin aspart  0-24 Units Subcutaneous 6 times per day  . insulin detemir  10 Units Subcutaneous Daily  . levalbuterol  0.63 mg Nebulization Q6H  . metoCLOPramide (REGLAN) injection  10 mg Intravenous 4 times per day  . metoprolol  5 mg Intravenous Q12H  . pantoprazole (PROTONIX) IV  40 mg Intravenous Q12H  . sodium chloride  10 mL Intravenous Q12H   Continuous Infusions: . sodium chloride 10 mL/hr at 05/16/15 0600  .  amiodarone 30 mg/hr (05/16/15 0600)  . feeding supplement (VITAL AF 1.2 CAL)     PRN Meds:.oxyCODONE **AND** acetaminophen, diphenhydrAMINE, hydrALAZINE, potassium chloride  General appearance: alert and cooperative Neurologic: intact Heart: regular rate and rhythm, S1, S2 normal, no murmur, click, rub or gallop Lungs: diminished breath sounds bibasilar Abdomen: soft, non-tender; bowel sounds normal; no masses,  no organomegaly Extremities: extremities normal, atraumatic, no cyanosis or edema and Homans sign is negative, no sign of DVT Wound: wounds intactno drainage from neck wound  Lab Results: CBC: Recent Labs  05/15/15 0345 05/16/15 0420  WBC 10.7* 10.6*  HGB 10.0* 9.6*  HCT 28.2* 27.4*  PLT 120* 142*   BMET:  Recent Labs  05/15/15 0345 05/16/15 0420  NA 133* 134*  K 3.8 3.7  CL 104 101  CO2 24 26  GLUCOSE 133* 182*  BUN 13 14  CREATININE 0.93 0.95  CALCIUM 7.9* 7.9*    PT/INR: No results for input(s): LABPROT, INR in the last 72 hours. Radiology: No results found.   Assessment/Plan: S/P Procedure(s) (LRB): VIDEO BRONCHOSCOPY (N/A) TRANSHIATAL TOTAL ESOPHAGEAL RESECTION (N/A) FEEDING JEJUNOSTOMY (N/A) D/c ng, keep npo Increase tube feeding Leave chest tubes still 400 ml for 24 hours Increased Lovenox to q 12 until can be back  on xarelto as was preop      Grace Isaac 05/16/2015 7:38 AM

## 2015-05-17 ENCOUNTER — Inpatient Hospital Stay (HOSPITAL_COMMUNITY): Payer: Commercial Managed Care - PPO

## 2015-05-17 LAB — GLUCOSE, CAPILLARY
Glucose-Capillary: 107 mg/dL — ABNORMAL HIGH (ref 65–99)
Glucose-Capillary: 139 mg/dL — ABNORMAL HIGH (ref 65–99)
Glucose-Capillary: 147 mg/dL — ABNORMAL HIGH (ref 65–99)
Glucose-Capillary: 151 mg/dL — ABNORMAL HIGH (ref 65–99)
Glucose-Capillary: 168 mg/dL — ABNORMAL HIGH (ref 65–99)
Glucose-Capillary: 171 mg/dL — ABNORMAL HIGH (ref 65–99)
Glucose-Capillary: 239 mg/dL — ABNORMAL HIGH (ref 65–99)

## 2015-05-17 LAB — BASIC METABOLIC PANEL
Anion gap: 7 (ref 5–15)
BUN: 16 mg/dL (ref 6–20)
CO2: 27 mmol/L (ref 22–32)
Calcium: 8 mg/dL — ABNORMAL LOW (ref 8.9–10.3)
Chloride: 100 mmol/L — ABNORMAL LOW (ref 101–111)
Creatinine, Ser: 0.95 mg/dL (ref 0.44–1.00)
GFR calc Af Amer: >60 mL/min (ref >60)
GFR calc non Af Amer: >60 mL/min (ref >60)
Glucose, Bld: 180 mg/dL — ABNORMAL HIGH (ref 65–99)
Potassium: 3.7 mmol/L (ref 3.5–5.1)
Sodium: 134 mmol/L — ABNORMAL LOW (ref 135–145)

## 2015-05-17 LAB — CBC
HCT: 27 % — ABNORMAL LOW (ref 36.0–46.0)
Hemoglobin: 9.4 g/dL — ABNORMAL LOW (ref 12.0–15.0)
MCH: 30 pg (ref 26.0–34.0)
MCHC: 34.8 g/dL (ref 30.0–36.0)
MCV: 86.3 fL (ref 78.0–100.0)
Platelets: 146 10*3/uL — ABNORMAL LOW (ref 150–400)
RBC: 3.13 MIL/uL — ABNORMAL LOW (ref 3.87–5.11)
RDW: 14.9 % (ref 11.5–15.5)
WBC: 11.5 10*3/uL — ABNORMAL HIGH (ref 4.0–10.5)

## 2015-05-17 MED ORDER — VITAL AF 1.2 CAL PO LIQD
1000.0000 mL | ORAL | Status: DC
  Administered 2015-05-18 – 2015-05-23 (×4): 1000 mL
  Filled 2015-05-17 (×13): qty 1000

## 2015-05-17 MED ORDER — GERHARDT'S BUTT CREAM
TOPICAL_CREAM | CUTANEOUS | Status: DC | PRN
  Filled 2015-05-17 (×4): qty 1

## 2015-05-17 MED ORDER — METOPROLOL TARTRATE 25 MG/10 ML ORAL SUSPENSION
12.5000 mg | Freq: Two times a day (BID) | ORAL | Status: DC
  Administered 2015-05-17 – 2015-06-17 (×54): 12.5 mg
  Filled 2015-05-17 (×65): qty 5

## 2015-05-17 NOTE — Care Management Note (Signed)
Case Management Note CM note started by Luz Lex RNCM  Patient Details  Name: Sharon Price MRN: [id number] Date of Birth: Sep 04, 1946  Subjective/Objective:       Prior to admission lives at home with son.  Son does not work and states is able and will be with her 24/7 on discharge.               Action/Plan:   Expected Discharge Date:                  Expected Discharge Plan:  [place]  In-House Referral:     Discharge planning Services  CM Consult  Post Acute Care Choice:    Choice offered to:     DME Arranged:    DME Agency:     HH Arranged:    [place] Agency:     Status of Service:  In process, will continue to follow  Medicare Important Message Given:    Date Medicare IM Given:    Medicare IM give by:    Date Additional Medicare IM Given:    Additional Medicare Important Message give by:     If discussed at [place] of Stay Meetings, dates discussed:  05/17/15  Additional Comments:  05/17/15- pt with NGT out-, remains NPO on TF- has CT in, continues on IV amio, IV lopressor, IV lasix, NCM to continue to follow for d/c needs and planning  Dawayne Patricia, RN 05/17/2015, 1:42 PM

## 2015-05-17 NOTE — Progress Notes (Signed)
Nutrition Follow Up  DOCUMENTATION CODES:   Not applicable  INTERVENTION:    Continue to advance to goal rate of 70 ml/hr   TF regimen to provide 2016 kcals, 126 gm protein, 1361 ml of free water  NUTRITION DIAGNOSIS:   Inadequate oral intake related to inability to eat (s/p esophageal resection) as evidenced by NPO status, ongoing  GOAL:   Patient will meet greater than or equal to 90% of their needs,progressing  MONITOR:   TF tolerance, Weight trends, Labs, I & O's  ASSESSMENT:  69 y.o. 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 The patient has completed neoadjuvant chemotherapy radiation with weekly carboplatin and and paclltaxel in AUG 2015. Her treatment course has been complicated with severe dehydration poor nutrition, requiring a PEG tube to be placed which becameinfected and had to be replaced several times. The patient needed supplementary IV fluids through a PICC line.   Patient s/p procedures 7/12: VIDEO BRONCHOSCOPY  TRANSHIATAL TOTAL ESOPHAGEAL RESECTION FEEDING JEJUNOSTOMY   Vital AF 1.2 formula currently infusing at 60 ml/hr via J-tube.  Tolerating well.  No N/V.  Swallow study tomorrow.  Diet Order:  Diet NPO time specified  Skin:  Wound (see comment) (Incision on abdomen, L neck, and wound on L knee)  Last BM:  N/A  Height:   Ht Readings from Last 1 Encounters:  05/10/15 5\' 2"  (1.575 m)    Weight:   Wt Readings from Last 1 Encounters:  05/17/15 164 lb 3.9 oz (74.5 kg)    Ideal Body Weight:  50 kg  Wt Readings from Last 10 Encounters:  05/17/15 164 lb 3.9 oz (74.5 kg)  05/06/15 157 lb 11.2 oz (71.532 kg)  04/28/15 156 lb (70.761 kg)  04/04/15 156 lb (70.761 kg)  03/31/15 151 lb (68.493 kg)  03/30/15 151 lb (68.493 kg)  03/15/15 151 lb (68.493 kg)  11/30/14 157 lb 3 oz (71.3 kg)  09/16/14 151 lb (68.493 kg)  08/10/14 136 lb (61.689 kg)     BMI:  Body mass index is 30.03 kg/(m^2).  Estimated Nutritional Needs:   Kcal:  2000-2200  Protein:  110-120 gm  Fluid:  2.0-2.2 L  EDUCATION NEEDS:   No education needs identified at this time  Arthur Holms, RD, LDN Pager #: [phone] After-Hours Pager #: [phone]

## 2015-05-17 NOTE — Progress Notes (Signed)
SUBJECTIVE:   Remains in NSR on amiodarone. BP stable. Plan for swallow evaluation upcoming.  OBJECTIVE:   Vitals:   Filed Vitals:   05/17/15 0500 05/17/15 0600 05/17/15 0700 05/17/15 0850  BP: 101/49 113/52 105/48   Pulse: 80 81 77   Temp:   98.5 F (36.9 C)   TempSrc:   Oral   Resp: 17 17 20    Height:      Weight:  164 lb 3.9 oz (74.5 kg)    SpO2: 96% 96% 96% 95%   I&O's:    Intake/Output Summary (Last 24 hours) at 05/17/15 0853 Last data filed at 05/17/15 0600  Gross per 24 hour  Intake 1150.7 ml  Output   1210 ml  Net  -59.3 ml   TELEMETRY: Reviewed telemetry pt in NSR:  PHYSICAL EXAM General: Well developed, well nourished, in no acute distress Lungs:   Clear bilaterally to auscultation and percussion. Heart:   HRRR S1 S2 Pulses are 2+ & equal. Abdomen: Bowel sounds are positive, abdomen soft and non-tender without masses Extremities:   No clubbing, cyanosis or edema.  DP +1 Neuro: Alert and oriented X 3. Psych:  Good affect, responds appropriately   LABS: Basic Metabolic Panel:  Recent Labs  05/16/15 0420 05/17/15 0420  NA 134* 134*  K 3.7 3.7  CL 101 100*  CO2 26 27  GLUCOSE 182* 180*  BUN 14 16  CREATININE 0.95 0.95  CALCIUM 7.9* 8.0*   Liver Function Tests: No results for input(s): AST, ALT, ALKPHOS, BILITOT, PROT, ALBUMIN in the last 72 hours. No results for input(s): LIPASE, AMYLASE in the last 72 hours. CBC:  Recent Labs  05/16/15 0420 05/17/15 0420  WBC 10.6* 11.5*  HGB 9.6* 9.4*  HCT 27.4* 27.0*  MCV 85.6 86.3  PLT 142* 146*   Cardiac Enzymes: No results for input(s): CKTOTAL, CKMB, CKMBINDEX, TROPONINI in the last 72 hours. BNP: Invalid input(s): POCBNP D-Dimer: No results for input(s): DDIMER in the last 72 hours. Hemoglobin A1C: No results for input(s): HGBA1C in the last 72 hours. Fasting Lipid Panel: No results for input(s): CHOL, HDL, LDLCALC, TRIG, CHOLHDL, LDLDIRECT in the last 72 hours. Thyroid Function  Tests: No results for input(s): TSH, T4TOTAL, T3FREE, THYROIDAB in the last 72 hours.  Invalid input(s): FREET3 Anemia Panel: No results for input(s): VITAMINB12, FOLATE, FERRITIN, TIBC, IRON, RETICCTPCT in the last 72 hours. Coag Panel:   Lab Results  Component Value Date   INR 1.37 05/12/2015   INR 1.04 05/06/2015    RADIOLOGY: Dg Chest 2 View  05/06/2015   CLINICAL DATA:  Preoperative examination prior to surgery for esophageal malignancy ; history of pulmonary embolism and diabetes, discontinued smoking 2 months ago.  EXAM: CHEST  2 VIEW  COMPARISON:  PA and lateral chest x-ray of March 30, 2015  FINDINGS: The lungs are adequately inflated. The interstitial markings are coarse bilaterally but are stable. There are no pulmonary parenchymal nodules or masses nor alveolar infiltrates. The heart and pulmonary vascularity are normal. The mediastinum is normal in width. There is no pleural effusion. 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 The patient has undergone posterior lumbar fusion.  IMPRESSION: Chronically increased interstitial markings are consistent with the patient's smoking history. There is no active cardiopulmonary disease.   Electronically Signed   By: David  Martinique M.D.   On: 05/06/2015 09:18   Dg Chest Port 1 View  05/17/2015   CLINICAL DATA:  Esophageal cancer.  EXAM: PORTABLE CHEST - 1 VIEW  COMPARISON:  05/15/2015.  FINDINGS: Interim removal of NG tube. Right IJ line in stable position. Bilateral chest tubes in stable position. No pneumothorax. Heart size stable. Low lung volumes with basilar atelectasis. Small left pleural effusion.  IMPRESSION: 1. Interim removal of NG tube. Right IJ line and bilateral chest tubes in stable position. No pneumothorax. 2. Low lung volumes with persistent bibasilar subsegmental atelectasis.   Electronically Signed   By: Marcello Moores  Register   On: 05/17/2015 07:28   Dg Chest Port 1 View  05/15/2015   CLINICAL  DATA:  Atelectasis and shortness of breath.  EXAM: PORTABLE CHEST - 1 VIEW  COMPARISON:  05/14/2015  FINDINGS: Right IJ central line tip to the superior vena cava. Bilateral chest tubes. Nasogastric tube is in place, tip to level of the proximal stomach.  Heart is enlarged. There are patchy opacities in the lung bases bilaterally in. There has been some interval improvement in aeration of both lungs. Small left pleural effusion persists. No pneumothorax.  IMPRESSION: 1. Some minimal improvement in aeration of both lung bases. 2. Minimal bibasilar atelectasis or infiltrates, left greater than right. 3. Persistent left pleural effusion.   Electronically Signed   By: Nolon Nations M.D.   On: 05/15/2015 08:27   Dg Chest Port 1 View  05/14/2015   CLINICAL DATA:  Esophageal abnormality.  EXAM: PORTABLE CHEST - 1 VIEW  COMPARISON:  05/13/2015  FINDINGS: Right IJ central line tip overlies the level of superior vena cava. Bilateral chest tubes are identified. Nasogastric tube tip overlies the level of the gastroesophageal junction.  Heart is enlarged. There are patchy densities within the lungs bilaterally, largely unchanged. Suspect bilateral pleural effusions. There is no pneumothorax.  IMPRESSION: 1. Persistent bilateral infiltrates. 2. No pneumothorax.   Electronically Signed   By: Nolon Nations M.D.   On: 05/14/2015 07:44   Dg Chest Port 1 View  05/13/2015   CLINICAL DATA:  Esophageal resection.  EXAM: PORTABLE CHEST - 1 VIEW  COMPARISON:  05/12/2015.  FINDINGS: Right IJ line, NG tube, bilateral chest tubes in stable position. No pneumothorax. Stable cardiomegaly. Mild progression of bibasilar atelectasis and/or infiltrates. No prominent pleural effusion.  IMPRESSION: 1. Lines and tubes including bilateral chest tubes in stable position. No pneumothorax. 2. Stable cardiomegaly . 3. Slight progression of bibasilar atelectasis and/or infiltrates.   Electronically Signed   By: Marcello Moores  Register   On: 05/13/2015  07:34   Dg Chest Port 1 View  05/12/2015   CLINICAL DATA:  Esophageal adenocarcinoma postop resection  EXAM: PORTABLE CHEST - 1 VIEW  COMPARISON:  05/11/2015  FINDINGS: NG tube remains in place extending below the diaphragm. Bilateral chest tubes in place. No pneumothorax. Right jugular central venous catheter tip at the cavoatrial junction.  Improved aeration with decrease in bibasilar atelectasis and slightly increased lung volume compared with the prior study.  IMPRESSION: Mild improvement in aeration in the lung bases compared with the prior study. No pneumothorax.   Electronically Signed   By: Franchot Gallo M.D.   On: 05/12/2015 07:42   Dg Chest Port 1 View  05/11/2015   CLINICAL DATA:  Status post esophagectomy  EXAM: PORTABLE CHEST - 1 VIEW  COMPARISON:  Chest x-ray of May 10, 2015  FINDINGS: The lungs remain mildly hypoinflated. Bilateral chest tubes are unchanged in position. There has been interval extubation of the trachea. Small amounts of pleural fluid at the lung bases are suspected. The cardiac silhouette is  mildly enlarged. The central pulmonary vascularity is prominent and slightly more conspicuous today. The esophagogastric tube and the right internal jugular catheters remain.  IMPRESSION: 1. Interval extubation of the trachea. There is persistent bilateral hypo inflation and slight interval increased prominence of the pulmonary interstitium which may reflect mild edema. Small bilateral pleural effusions are present. There is no pneumothorax. 2. The remaining support tubes are in stable position.   Electronically Signed   By: David  Martinique M.D.   On: 05/11/2015 07:20   Dg Chest Port 1 View  05/10/2015   CLINICAL DATA:  Postop esophagectomy, esophageal cancer  EXAM: PORTABLE CHEST - 1 VIEW  COMPARISON:  05/06/2015  FINDINGS: Endotracheal tube 4.5 cm above the carina. NG tube in the midline extending below the hemidiaphragms. Right IJ central line tip at the lower SVC level. Bilateral  chest tubes in place. No pneumothorax or large effusion. Left lower lobe collapse/ consolidation noted obscuring the left hemidiaphragm. Minor right base atelectasis.  IMPRESSION: Support apparatus in good position.  No significant pneumothorax or large effusion  Left greater the right basilar atelectasis.   Electronically Signed   By: Jerilynn Mages.  Shick M.D.   On: 05/10/2015 19:04   ASSESSMENT AND PLAN:  PAF: Maintaining NSR on IV amiodarone. Change to systemic anticoagulation when ok with surgery and tubes out. Would convert to po amiodarone 400 mg daily when she can take po.  Esophageal Cancer: Progressing with NG tube in iv protonix and tube feeds  DM: SS insulin as needed   Pixie Casino, MD, [facility] Attending Cardiologist [place], MD  05/17/2015  8:53 AM

## 2015-05-17 NOTE — Progress Notes (Signed)
      [address]       [place], [address]             [phone]      Sleeping at present  BP 113/46 mmHg  Pulse 81  Temp(Src) 98.7 F (37.1 C) (Oral)  Resp 20  Ht 5\' 2"  (1.575 m)  Wt 164 lb 3.9 oz (74.5 kg)  BMI 30.03 kg/m2  SpO2 95%   Intake/Output Summary (Last 24 hours) at 05/17/15 1739 Last data filed at 05/17/15 1700  Gross per 24 hour  Intake 1740.7 ml  Output    290 ml  Net 1450.7 ml    CBG better this afternoon  Continue current care  Aspin Palomarez C. Roxan Hockey, MD [facility] [phone]

## 2015-05-17 NOTE — Progress Notes (Signed)
Patient ID: Sharon Price, female   DOB: October 26, 1946, 69 y.o.   MRN: [id number] TCTS DAILY ICU PROGRESS NOTE                   [place]            [place] [address]          [phone]   7 Days Post-Op Procedure(s) (LRB): VIDEO BRONCHOSCOPY (N/A) TRANSHIATAL TOTAL ESOPHAGEAL RESECTION (N/A) FEEDING JEJUNOSTOMY (N/A)  Total Length of Stay:  LOS: 7 days   Subjective: Overall feels better, ambulated better today  Objective: Vital signs in last 24 hours: Temp:  [97.9 F (36.6 C)-98.5 F (36.9 C)] 98.5 F (36.9 C) (07/19 0700) Pulse Rate:  [57-87] 57 (07/19 1000) Cardiac Rhythm:  [-] Normal sinus rhythm (07/19 0800) Resp:  [14-23] 17 (07/19 1000) BP: (85-131)/(40-60) 110/60 mmHg (07/19 1000) SpO2:  [94 %-100 %] 98 % (07/19 1000) Weight:  [164 lb 3.9 oz (74.5 kg)] 164 lb 3.9 oz (74.5 kg) (07/19 0600)  Filed Weights   05/15/15 0500 05/16/15 0600 05/17/15 0600  Weight: 170 lb 10.2 oz (77.4 kg) 171 lb 8.3 oz (77.8 kg) 164 lb 3.9 oz (74.5 kg)    Weight change: -7 lb 4.4 oz (-3.3 kg)   Hemodynamic parameters for last 24 hours:    Intake/Output from previous day: 07/18 0701 - 07/19 0700 In: 1267.4 [I.V.:597.4; NG/GT:520; IV Piggyback:150] Out: 1310 [Urine:950; Chest Tube:360]  Intake/Output this shift:    Current Meds: Scheduled Meds: . antiseptic oral rinse  7 mL Mouth Rinse QID  . chlorhexidine  15 mL Mouth Rinse BID  . enoxaparin (LOVENOX) injection  30 mg Subcutaneous Q12H  . insulin aspart  0-24 Units Subcutaneous 6 times per day  . insulin detemir  10 Units Subcutaneous Daily  . levalbuterol  0.63 mg Nebulization TID  . metoCLOPramide (REGLAN) injection  10 mg Intravenous 4 times per day  . metoprolol tartrate  12.5 mg Per Tube BID  . pantoprazole (PROTONIX) IV  40 mg Intravenous Q12H  . sodium chloride  10 mL Intravenous Q12H   Continuous Infusions: . sodium chloride 10 mL/hr at 05/17/15 0600  . amiodarone 30 mg/hr (05/17/15 0600)  .  feeding supplement (VITAL AF 1.2 CAL)     PRN Meds:.oxyCODONE **AND** acetaminophen, diphenhydrAMINE, hydrALAZINE, potassium chloride  General appearance: alert and cooperative Neurologic: intact Heart: regular rate and rhythm, S1, S2 normal, no murmur, click, rub or gallop Lungs: diminished breath sounds bibasilar Abdomen: soft, non-tender; bowel sounds normal; no masses,  no organomegaly Extremities: extremities normal, atraumatic, no cyanosis or edema and Homans sign is negative, no sign of DVT Wound: no drainage from neck, abdominal wound intact  Lab Results: CBC: Recent Labs  05/16/15 0420 05/17/15 0420  WBC 10.6* 11.5*  HGB 9.6* 9.4*  HCT 27.4* 27.0*  PLT 142* 146*   BMET:  Recent Labs  05/16/15 0420 05/17/15 0420  NA 134* 134*  K 3.7 3.7  CL 101 100*  CO2 26 27  GLUCOSE 182* 180*  BUN 14 16  CREATININE 0.95 0.95  CALCIUM 7.9* 8.0*    PT/INR: No results for input(s): LABPROT, INR in the last 72 hours. Radiology: Dg Chest Port 1 View  05/17/2015   CLINICAL DATA:  Esophageal cancer.  EXAM: PORTABLE CHEST - 1 VIEW  COMPARISON:  05/15/2015.  FINDINGS: Interim removal of NG tube. Right IJ line in stable position. Bilateral chest tubes in stable position. No pneumothorax. Heart size  stable. Low lung volumes with basilar atelectasis. Small left pleural effusion.  IMPRESSION: 1. Interim removal of NG tube. Right IJ line and bilateral chest tubes in stable position. No pneumothorax. 2. Low lung volumes with persistent bibasilar subsegmental atelectasis.   Electronically Signed   By: Marcello Moores  Register   On: 05/17/2015 07:28     Assessment/Plan: S/P Procedure(s) (LRB): VIDEO BRONCHOSCOPY (N/A) TRANSHIATAL TOTAL ESOPHAGEAL RESECTION (N/A) FEEDING JEJUNOSTOMY (N/A) Mobilize swallow tomorrow, leave chests in for now but drainage down to 160-180 for 24 hours from each side    Grace Isaac 05/17/2015 10:08 AM

## 2015-05-18 ENCOUNTER — Inpatient Hospital Stay (HOSPITAL_COMMUNITY): Payer: Commercial Managed Care - PPO

## 2015-05-18 LAB — CBC
HCT: 26.1 % — ABNORMAL LOW (ref 36.0–46.0)
Hemoglobin: 9 g/dL — ABNORMAL LOW (ref 12.0–15.0)
MCH: 29.8 pg (ref 26.0–34.0)
MCHC: 34.5 g/dL (ref 30.0–36.0)
MCV: 86.4 fL (ref 78.0–100.0)
Platelets: 173 10*3/uL (ref 150–400)
RBC: 3.02 MIL/uL — ABNORMAL LOW (ref 3.87–5.11)
RDW: 15.3 % (ref 11.5–15.5)
WBC: 14.4 10*3/uL — ABNORMAL HIGH (ref 4.0–10.5)

## 2015-05-18 LAB — BASIC METABOLIC PANEL
Anion gap: 10 (ref 5–15)
BUN: 21 mg/dL — ABNORMAL HIGH (ref 6–20)
CO2: 26 mmol/L (ref 22–32)
Calcium: 7.9 mg/dL — ABNORMAL LOW (ref 8.9–10.3)
Chloride: 98 mmol/L — ABNORMAL LOW (ref 101–111)
Creatinine, Ser: 0.95 mg/dL (ref 0.44–1.00)
GFR calc Af Amer: >60 mL/min (ref >60)
GFR calc non Af Amer: >60 mL/min (ref >60)
Glucose, Bld: 216 mg/dL — ABNORMAL HIGH (ref 65–99)
Potassium: 4.1 mmol/L (ref 3.5–5.1)
Sodium: 134 mmol/L — ABNORMAL LOW (ref 135–145)

## 2015-05-18 LAB — GLUCOSE, CAPILLARY
Glucose-Capillary: 140 mg/dL — ABNORMAL HIGH (ref 65–99)
Glucose-Capillary: 160 mg/dL — ABNORMAL HIGH (ref 65–99)
Glucose-Capillary: 162 mg/dL — ABNORMAL HIGH (ref 65–99)
Glucose-Capillary: 197 mg/dL — ABNORMAL HIGH (ref 65–99)
Glucose-Capillary: 202 mg/dL — ABNORMAL HIGH (ref 65–99)
Glucose-Capillary: 232 mg/dL — ABNORMAL HIGH (ref 65–99)

## 2015-05-18 MED ORDER — METOCLOPRAMIDE HCL 5 MG/5ML PO SOLN
10.0000 mg | Freq: Four times a day (QID) | ORAL | Status: DC
  Administered 2015-05-18 – 2015-06-17 (×109): 10 mg
  Filled 2015-05-18 (×138): qty 10

## 2015-05-18 MED ORDER — IOHEXOL 300 MG/ML  SOLN
150.0000 mL | Freq: Once | INTRAMUSCULAR | Status: AC | PRN
  Administered 2015-05-18: 150 mL via ORAL

## 2015-05-18 NOTE — Progress Notes (Signed)
Patient ID: Sharon Price, female   DOB: [birth year], 69 y.o.   MRN: [id number]  Hemodynamically stable  Tmax 100  Had swallow study without leak  Up in chair with family.

## 2015-05-18 NOTE — Progress Notes (Signed)
Patient ID: Sharon Price, female   DOB: 16-Feb-1946, 69 y.o.   MRN: [id number] TCTS DAILY ICU PROGRESS NOTE                   [place]            [place] [address]          [phone]   8 Days Post-Op Procedure(s) (LRB): VIDEO BRONCHOSCOPY (N/A) TRANSHIATAL TOTAL ESOPHAGEAL RESECTION (N/A) FEEDING JEJUNOSTOMY (N/A)  Total Length of Stay:  LOS: 8 days   Subjective: Up to chair after swallow, no complaints from swallow  Objective: Vital signs in last 24 hours: Temp:  [98.6 F (37 C)-100 F (37.8 C)] 100 F (37.8 C) (07/20 1234) Pulse Rate:  [77-92] 84 (07/20 1200) Cardiac Rhythm:  [-] Normal sinus rhythm (07/20 1200) Resp:  [13-24] 24 (07/20 1200) BP: (95-129)/(42-70) 121/64 mmHg (07/20 1200) SpO2:  [93 %-98 %] 96 % (07/20 1200) Weight:  [159 lb 9.8 oz (72.4 kg)] 159 lb 9.8 oz (72.4 kg) (07/20 0500)  Filed Weights   05/16/15 0600 05/17/15 0600 05/18/15 0500  Weight: 171 lb 8.3 oz (77.8 kg) 164 lb 3.9 oz (74.5 kg) 159 lb 9.8 oz (72.4 kg)    Weight change: -4 lb 10.1 oz (-2.1 kg)   Hemodynamic parameters for last 24 hours:    Intake/Output from previous day: 07/19 0701 - 07/20 0700 In: 2104.1 [I.V.:624.1; NG/GT:1380; IV Piggyback:100] Out: 200 [Chest Tube:200]  Intake/Output this shift: Total I/O In: 433.5 [I.V.:133.5; NG/GT:300] Out: 305 [Urine:125; Chest Tube:180]  Current Meds: Scheduled Meds: . antiseptic oral rinse  7 mL Mouth Rinse QID  . chlorhexidine  15 mL Mouth Rinse BID  . enoxaparin (LOVENOX) injection  30 mg Subcutaneous Q12H  . insulin aspart  0-24 Units Subcutaneous 6 times per day  . insulin detemir  10 Units Subcutaneous Daily  . levalbuterol  0.63 mg Nebulization TID  . metoCLOPramide  10 mg Per Tube 4 times per day  . metoprolol tartrate  12.5 mg Per Tube BID  . pantoprazole (PROTONIX) IV  40 mg Intravenous Q12H  . sodium chloride  10 mL Intravenous Q12H   Continuous Infusions: . sodium chloride 10 mL/hr at  05/17/15 1800  . amiodarone 30 mg/hr (05/18/15 1200)  . feeding supplement (VITAL AF 1.2 CAL) 1,000 mL (05/18/15 1050)   PRN Meds:.oxyCODONE **AND** acetaminophen, diphenhydrAMINE, Bernie Fobes's butt cream, hydrALAZINE, potassium chloride  General appearance: alert, cooperative and no distress Neurologic: intact Heart: regular rate and rhythm, S1, S2 normal, no murmur, click, rub or gallop Lungs: diminished breath sounds bibasilar Abdomen: soft, non-tender; bowel sounds normal; no masses,  no organomegaly Extremities: extremities normal, atraumatic, no cyanosis or edema and Homans sign is negative, no sign of DVT Wound: wounds ok, small amount drainage at neck penrose , no obvious leak on swallow  Lab Results: CBC: Recent Labs  05/17/15 0420 05/18/15 0420  WBC 11.5* 14.4*  HGB 9.4* 9.0*  HCT 27.0* 26.1*  PLT 146* 173   BMET:  Recent Labs  05/17/15 0420 05/18/15 0420  NA 134* 134*  K 3.7 4.1  CL 100* 98*  CO2 27 26  GLUCOSE 180* 216*  BUN 16 21*  CREATININE 0.95 0.95  CALCIUM 8.0* 7.9*    PT/INR: No results for input(s): LABPROT, INR in the last 72 hours. Radiology: Dg Chest 2 View  05/18/2015   CLINICAL DATA:  Status post complete esophagectomy 8 days ago ; assess chest tube positioning.  EXAM: CHEST  2 VIEW  COMPARISON:  Portable chest x-ray of May 17, 2015  FINDINGS: The bilateral chest tubes are in stable position overlying the posterior aspects of the 6 ribs. There is no pneumothorax or significant pleural effusion. There is improving left lower lobe atelectasis. The cardiac silhouette is normal in size. The pulmonary vascularity is not engorged. The right internal jugular venous catheter has its tip in the mid to distal SVC. There are bilateral cervical ribs.  IMPRESSION: 1. Stable positioning of the bilateral chest tubes and right IJ venous catheter. 2. Improving left lower lobe atelectasis and small left pleural effusion.   Electronically Signed   By: David  Martinique M.D.    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  EXAM: ESOPHOGRAM/BARIUM SWALLOW  TECHNIQUE: Single contrast examination was performed using water-soluble contrast (150 ml Omnipaque 300).  FLUOROSCOPY TIME:  Radiation Exposure Index (as provided by the fluoroscopic device): 954.81 uGy*m2  If the device does not provide the exposure index:  Fluoroscopy Time:  2 minutes and 0 seconds.  Number of Acquired Images:  COMPARISON:  Chest radiographs 05/18/2015 and 05/17/2015. PET-CT 03/24/2015.  FINDINGS: Examination was performed in the supine, semi erect and bilateral oblique views. Patient has limited mobility due to the presence of bilateral chest tubes.  The patient swallowed the contrast without difficulty. Contrast passed freely into the intra thoracic stomach. Superior to the aortic arch and adjacent to an apparent Penrose drain, there is left lateral transient extension of contrast without gross extravasation or opacification of the drain or chest tube. There is no contrast within the left pleural space. This is less evident with the patient exam in the RPO position. It is unclear as to whether this reflects redundancy of the proximal gastric lumen near the anastomosis or a small contained perforation.  There was stasis of contrast within the distal stomach. Despite having the patient in the semi-erect and bilateral oblique positions for approximately 8 minutes, there was limited drainage of contrast below the diaphragm into the duodenum.  IMPRESSION: 1. Transient lateral extension of contrast above the aortic arch near the expected proximal anastomosis may reflect a small contained perforation or redundancy of the proximal gastric lumen. There is no opacification of the left pleural space. Follow up imaging recommended. Chest CT may be helpful for further evaluation. 2. Limited  emptying of the intra thoracic stomach, attributed to ileus. Faint contrast is demonstrated within the duodenum.   Electronically Signed   By: Richardean Sale M.D.   On: 05/18/2015 10:54     Assessment/Plan: S/P Procedure(s) (LRB): VIDEO BRONCHOSCOPY (N/A) TRANSHIATAL TOTAL ESOPHAGEAL RESECTION (N/A) FEEDING JEJUNOSTOMY (N/A) Swallow reviewed, no obvious leak at neck will get follow up film to evaluate gastric emptying before starting po , already on reglan.      Grace Isaac 05/18/2015 12:55 PM

## 2015-05-18 NOTE — Progress Notes (Signed)
Physical Therapy Treatment Patient Details Name: Sharon Price MRN: [id number] DOB: 11-Nov-1945 Today's Date: 05/18/2015    History of Present Illness Adm for surgical esophagectomy 712; (pt s/p esophageal cancer and has completed chemotherapy/radiation. Her treatment course has been complicated with severe dehydration poor nutrition, requiring a PEG tube to be placed which became infected and had to be replaced several times. PMHx- DM, PE, CKD    PT Comments    Continues to slowly progress. Primarily limited by fatigue. Contined need for education on safe use of DME, balance, and upright posture. Pt very motivated  Follow Up Recommendations  Supervision/Assistance - 24 hour;Home health PT (pt reports her son will be with her 24/7)     Equipment Recommendations  None recommended by PT    Recommendations for Other Services OT consult     Precautions / Restrictions Precautions Precautions: Fall Precaution Comments: 2 chest tubes, Drain LUQ, foley catheter, abdominal incision, right IJ line Restrictions Weight Bearing Restrictions: No    Mobility  Bed Mobility Overal bed mobility: Needs Assistance;+2 for physical assistance Bed Mobility: Rolling;Sidelying to Sit Rolling: Supervision Sidelying to sit: Min assist;HOB elevated       General bed mobility comments: Assist to raise torso due to pain and weakness  Transfers Overall transfer level: Needs assistance Equipment used: Rolling walker (2 wheeled) Transfers: Sit to/from Stand Sit to Stand: Min assist         General transfer comment: x 2; vc for sequencing/technique; vc for full upright position (pt avoiding due to abd pain)  Ambulation/Gait Ambulation/Gait assistance: Min assist Ambulation Distance (Feet): 150 Feet Assistive device: Rolling walker (2 wheeled) (push w/c) Gait Pattern/deviations: Step-through pattern;Decreased stride length;Trunk flexed Gait velocity: slow   General Gait Details: very flexed  torso, not able to fully correct due to pain; standing rest breaks (propping on forearms on RW) x 3 in 150 ft   Stairs            Wheelchair Mobility    Modified Rankin (Stroke Patients Only)       Balance Overall balance assessment: Needs assistance   Sitting balance-Leahy Scale: Fair     Standing balance support: Bilateral upper extremity supported Standing balance-Leahy Scale: Poor Standing balance comment: stood x 3 minutes for pericare after stand from [facility]                    Cognition Arousal/Alertness: Awake/alert Behavior During Therapy: WFL for tasks assessed/performed Overall Cognitive Status: Within Functional Limits for tasks assessed                      Exercises General Exercises - Lower Extremity Ankle Circles/Pumps: AROM;Both;10 reps    General Comments        Pertinent Vitals/Pain HR 102-106 during ambulation  Pain Assessment: Faces Faces Pain Scale: Hurts little more Pain Location: abd Pain Intervention(s): Limited activity within patient's tolerance;Monitored during session    Home Living                      Prior Function            PT Goals (current goals can now be found in the care plan section) Acute Rehab PT Goals Patient Stated Goal: Return to PLOF Time For Goal Achievement: 05/27/15 Progress towards PT goals: Progressing toward goals    Frequency  Min 3X/week    PT Plan Current plan remains appropriate    Co-evaluation  End of Session Equipment Utilized During Treatment: Gait belt (Under arm pits for safety. Chest tubes under breasts) Activity Tolerance: Patient limited by fatigue Patient left: with call bell/phone within reach;with nursing/sitter in room;in chair     Time: 9584-4171 PT Time Calculation (min) (ACUTE ONLY): 32 min  Charges:  $Gait Training: 8-22 mins $Therapeutic Activity: 8-22 mins                    G Codes:      Edelmiro Innocent 2015-05-25, 12:06  PM Pager [phone]

## 2015-05-19 LAB — GLUCOSE, CAPILLARY
Glucose-Capillary: 114 mg/dL — ABNORMAL HIGH (ref 65–99)
Glucose-Capillary: 169 mg/dL — ABNORMAL HIGH (ref 65–99)
Glucose-Capillary: 179 mg/dL — ABNORMAL HIGH (ref 65–99)
Glucose-Capillary: 216 mg/dL — ABNORMAL HIGH (ref 65–99)
Glucose-Capillary: 239 mg/dL — ABNORMAL HIGH (ref 65–99)
Glucose-Capillary: 265 mg/dL — ABNORMAL HIGH (ref 65–99)

## 2015-05-19 LAB — BASIC METABOLIC PANEL
Anion gap: 9 (ref 5–15)
BUN: 22 mg/dL — ABNORMAL HIGH (ref 6–20)
CO2: 26 mmol/L (ref 22–32)
Calcium: 8.1 mg/dL — ABNORMAL LOW (ref 8.9–10.3)
Chloride: 100 mmol/L — ABNORMAL LOW (ref 101–111)
Creatinine, Ser: 0.92 mg/dL (ref 0.44–1.00)
GFR calc Af Amer: >60 mL/min (ref >60)
GFR calc non Af Amer: >60 mL/min (ref >60)
Glucose, Bld: 230 mg/dL — ABNORMAL HIGH (ref 65–99)
Potassium: 4 mmol/L (ref 3.5–5.1)
Sodium: 135 mmol/L (ref 135–145)

## 2015-05-19 LAB — CBC
HCT: 26.3 % — ABNORMAL LOW (ref 36.0–46.0)
Hemoglobin: 8.9 g/dL — ABNORMAL LOW (ref 12.0–15.0)
MCH: 29.3 pg (ref 26.0–34.0)
MCHC: 33.8 g/dL (ref 30.0–36.0)
MCV: 86.5 fL (ref 78.0–100.0)
Platelets: 208 10*3/uL (ref 150–400)
RBC: 3.04 MIL/uL — ABNORMAL LOW (ref 3.87–5.11)
RDW: 15.3 % (ref 11.5–15.5)
WBC: 14.6 10*3/uL — ABNORMAL HIGH (ref 4.0–10.5)

## 2015-05-19 MED ORDER — AMIODARONE PEDIATRIC ORAL SUSPENSION 5 MG/ML
200.0000 mg | Freq: Two times a day (BID) | ORAL | Status: DC
  Administered 2015-05-19 – 2015-06-17 (×59): 200 mg via ORAL
  Filled 2015-05-19 (×74): qty 40

## 2015-05-19 MED ORDER — AMIODARONE HCL IN DEXTROSE 360-4.14 MG/200ML-% IV SOLN
INTRAVENOUS | Status: AC
  Administered 2015-05-19: 12:00:00
  Filled 2015-05-19: qty 200

## 2015-05-19 NOTE — Progress Notes (Signed)
Esophagram results noted - continue IV amiodarone until reliably taking solid foods, then could switch to po amiodarone 400 mg daily.  Pixie Casino, MD, [facility] Attending Cardiologist [place]

## 2015-05-19 NOTE — Progress Notes (Signed)
Patient ID: Sharon Price, female   DOB: Dec 16, 1945, 69 y.o.   MRN: [id number] EVENING ROUNDS NOTE :     [address]       [place] [address]             [phone]                 9 Days Post-Op Procedure(s) (LRB): VIDEO BRONCHOSCOPY (N/A) TRANSHIATAL TOTAL ESOPHAGEAL RESECTION (N/A) FEEDING JEJUNOSTOMY (N/A)  Total Length of Stay:  LOS: 9 days  BP 95/41 mmHg  Pulse 84  Temp(Src) 98.9 F (37.2 C) (Oral)  Resp 19  Ht 5\' 2"  (1.575 m)  Wt 157 lb 13.6 oz (71.6 kg)  BMI 28.86 kg/m2  SpO2 95%  .Intake/Output      07/20 0701 - 07/21 0700 07/21 0701 - 07/22 0700   I.V. (mL/kg) 567.4 (7.9) 50.1 (0.7)   Other 420    NG/GT 1200    IV Piggyback     Total Intake(mL/kg) 2187.4 (30.6) 50.1 (0.7)   Urine (mL/kg/hr) 945 (0.5)    Stool 0 (0)    Chest Tube 210 (0.1) 120 (0.1)   Total Output 1155 120   Net +1032.4 -69.9        Urine Occurrence 3 x    Stool Occurrence 2 x      . sodium chloride 10 mL/hr at 05/17/15 1800  . feeding supplement (VITAL AF 1.2 CAL) 1,000 mL (05/18/15 1050)     Lab Results  Component Value Date   WBC 14.6* 05/19/2015   HGB 8.9* 05/19/2015   HCT 26.3* 05/19/2015   PLT 208 05/19/2015   GLUCOSE 230* 05/19/2015   ALT 14 05/06/2015   AST 21 05/06/2015   NA 135 05/19/2015   K 4.0 05/19/2015   CL 100* 05/19/2015   CREATININE 0.92 05/19/2015   BUN 22* 05/19/2015   CO2 26 05/19/2015   INR 1.37 05/12/2015   HGBA1C 8.0* 05/10/2015   Patient stable, drainage from left neck penrose Considering opening left neck tomorrow, will evaluate wound and drainage and decide in am  Grace Isaac MD  Beeper [phone] Office [phone] 05/19/2015 6:33 PM

## 2015-05-19 NOTE — Progress Notes (Signed)
Inpatient Diabetes Program Recommendations  AACE/ADA: New Consensus Statement on Inpatient Glycemic Control (2013)  Target Ranges:  Prepandial:   less than 140 mg/dL      Peak postprandial:   less than 180 mg/dL (1-2 hours)      Critically ill patients:  140 - 180 mg/dL   Results for DANYELL, SHADER (MRN [id number]) as of 05/19/2015 09:06  Ref. Range 05/18/2015 08:05 05/18/2015 12:31 05/18/2015 16:49 05/18/2015 19:07 05/18/2015 23:52 05/19/2015 04:04  Glucose-Capillary Latest Ref Range: 65-99 mg/dL 232 (H) 140 (H) 202 (H) 162 (H) 160 (H) 216 (H)   Current orders for Inpatient glycemic control: Levemir 10 units daily, Novolog 0-24 units Q4H  Inpatient Diabetes Program Recommendations Insulin - Basal:  Please increase Levemir to 15 units daily. Insulin - Meal Coverage: Please order Novolog 4 units Q4H for tube feeding coverage.  Note: Glucose has ranged from 140-232 mg/dl over the past 24 hours and patient has received a total of Novolog 44 units for correction over the past 24 hours.  Thanks, Barnie Alderman, RN, MSN, CCRN, CDE Diabetes Coordinator Inpatient Diabetes Program [phone] (Team Pager from [place] to [place]) [phone] ([facility] office) [phone] [facility] office) [phone] [facility] office)

## 2015-05-19 NOTE — Progress Notes (Signed)
Patient ID: Sharon Price, female   DOB: 1946/03/09, 69 y.o.   MRN: [id number] TCTS DAILY ICU PROGRESS NOTE                   [address]            [place] [address]          [phone]   9 Days Post-Op Procedure(s) (LRB): VIDEO BRONCHOSCOPY (N/A) TRANSHIATAL TOTAL ESOPHAGEAL RESECTION (N/A) FEEDING JEJUNOSTOMY (N/A)  Total Length of Stay:  LOS: 9 days   Subjective: Up in chair , feels well   Objective: Vital signs in last 24 hours: Temp:  [98.7 F (37.1 C)-100 F (37.8 C)] 98.7 F (37.1 C) (07/21 0410) Pulse Rate:  [77-90] 86 (07/21 0500) Cardiac Rhythm:  [-] Normal sinus rhythm (07/21 0500) Resp:  [12-29] 18 (07/21 0500) BP: (94-127)/(36-86) 100/45 mmHg (07/21 0500) SpO2:  [93 %-97 %] 95 % (07/21 0500) Weight:  [157 lb 13.6 oz (71.6 kg)] 157 lb 13.6 oz (71.6 kg) (07/21 0500)  Filed Weights   05/17/15 0600 05/18/15 0500 05/19/15 0500  Weight: 164 lb 3.9 oz (74.5 kg) 159 lb 9.8 oz (72.4 kg) 157 lb 13.6 oz (71.6 kg)    Weight change: -1 lb 12.2 oz (-0.8 kg)   Hemodynamic parameters for last 24 hours:    Intake/Output from previous day: 07/20 0701 - 07/21 0700 In: 2187.4 [I.V.:567.4; NG/GT:1200] Out: 1155 [Urine:945; Chest Tube:210]  Intake/Output this shift:    Current Meds: Scheduled Meds: . antiseptic oral rinse  7 mL Mouth Rinse QID  . chlorhexidine  15 mL Mouth Rinse BID  . enoxaparin (LOVENOX) injection  30 mg Subcutaneous Q12H  . insulin aspart  0-24 Units Subcutaneous 6 times per day  . insulin detemir  10 Units Subcutaneous Daily  . levalbuterol  0.63 mg Nebulization TID  . metoCLOPramide  10 mg Per Tube 4 times per day  . metoprolol tartrate  12.5 mg Per Tube BID  . pantoprazole (PROTONIX) IV  40 mg Intravenous Q12H  . sodium chloride  10 mL Intravenous Q12H   Continuous Infusions: . sodium chloride 10 mL/hr at 05/17/15 1800  . amiodarone 30 mg/hr (05/19/15 0500)  . feeding supplement (VITAL AF 1.2 CAL) 1,000 mL (05/18/15  1050)   PRN Meds:.oxyCODONE **AND** acetaminophen, diphenhydrAMINE, Rommie Dunn's butt cream, hydrALAZINE, potassium chloride  General appearance: alert and cooperative Neurologic: intact Heart: regular rate and rhythm, S1, S2 normal, no murmur, click, rub or gallop Lungs: diminished breath sounds bibasilar Abdomen: soft, non-tender; bowel sounds normal; no masses,  no organomegaly Extremities: extremities normal, atraumatic, no cyanosis or edema Increased drainage from penrose in neck with taking water, wounds intact   Lab Results: CBC: Recent Labs  05/18/15 0420 05/19/15 0519  WBC 14.4* 14.6*  HGB 9.0* 8.9*  HCT 26.1* 26.3*  PLT 173 208   BMET:  Recent Labs  05/18/15 0420 05/19/15 0519  NA 134* 135  K 4.1 4.0  CL 98* 100*  CO2 26 26  GLUCOSE 216* 230*  BUN 21* 22*  CREATININE 0.95 0.92  CALCIUM 7.9* 8.1*    PT/INR: No results for input(s): LABPROT, INR in the last 72 hours. Radiology: Dg Chest 2 View  05/18/2015   CLINICAL DATA:  Status post complete esophagectomy 8 days ago ; assess chest tube positioning.  EXAM: CHEST  2 VIEW  COMPARISON:  Portable chest x-ray of May 17, 2015  FINDINGS: The bilateral chest tubes are in stable position overlying  the posterior aspects of the 6 ribs. There is no pneumothorax or significant pleural effusion. There is improving left lower lobe atelectasis. The cardiac silhouette is normal in size. The pulmonary vascularity is not engorged. The right internal jugular venous catheter has its tip in the mid to distal SVC. There are bilateral cervical ribs.  IMPRESSION: 1. Stable positioning of the bilateral chest tubes and right IJ venous catheter. 2. Improving left lower lobe atelectasis and small left pleural effusion.   Electronically Signed   By: David  Martinique M.D.   On: 05/18/2015 09:30   Dg Abd Portable 1v  05/18/2015   ADDENDUM REPORT: 05/18/2015 17:31  ADDENDUM: It should be noted contrast material is noted within the small bowel as well  as the proximal colon consistent with normal motility.   Electronically Signed   By: Inez Catalina M.D.   On: 05/18/2015 17:31   05/18/2015   CLINICAL DATA:  Delayed gastric emptying  EXAM: PORTABLE ABDOMEN - 1 VIEW  COMPARISON:  03/07/2015  FINDINGS: Scattered large and small bowel gas is noted. Postsurgical changes are noted in the lower lumbar spine. The catheter is noted in the left mid abdomen consistent with jejunostomy catheter. No obstructive changes are noted. No definitive free air is seen.  IMPRESSION: Nonspecific abdomen.  Electronically Signed: By: Inez Catalina M.D. 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  EXAM: ESOPHOGRAM/BARIUM SWALLOW  TECHNIQUE: Single contrast examination was performed using water-soluble contrast (150 ml Omnipaque 300).  FLUOROSCOPY TIME:  Radiation Exposure Index (as provided by the fluoroscopic device): 954.81 uGy*m2  If the device does not provide the exposure index:  Fluoroscopy Time:  2 minutes and 0 seconds.  Number of Acquired Images:  COMPARISON:  Chest radiographs 05/18/2015 and 05/17/2015. PET-CT 03/24/2015.  FINDINGS: Examination was performed in the supine, semi erect and bilateral oblique views. Patient has limited mobility due to the presence of bilateral chest tubes.  The patient swallowed the contrast without difficulty. Contrast passed freely into the intra thoracic stomach. Superior to the aortic arch and adjacent to an apparent Penrose drain, there is left lateral transient extension of contrast without gross extravasation or opacification of the drain or chest tube. There is no contrast within the left pleural space. This is less evident with the patient exam in the RPO position. It is unclear as to whether this reflects redundancy of the proximal gastric lumen near the anastomosis or a small contained perforation.   There was stasis of contrast within the distal stomach. Despite having the patient in the semi-erect and bilateral oblique positions for approximately 8 minutes, there was limited drainage of contrast below the diaphragm into the duodenum.  IMPRESSION: 1. Transient lateral extension of contrast above the aortic arch near the expected proximal anastomosis may reflect a small contained perforation or redundancy of the proximal gastric lumen. There is no opacification of the left pleural space. Follow up imaging recommended. Chest CT may be helpful for further evaluation. 2. Limited emptying of the intra thoracic stomach, attributed to ileus. Faint contrast is demonstrated within the duodenum.   Electronically Signed   By: Richardean Sale M.D.   On: 05/18/2015 10:54     Assessment/Plan: S/P Procedure(s) (LRB): VIDEO BRONCHOSCOPY (N/A) TRANSHIATAL TOTAL ESOPHAGEAL RESECTION (N/A) FEEDING JEJUNOSTOMY (N/A) Mobilize Diuresis  Probable leak from neck anastomosis with increased drainage, will let take sips of h20 but  not increase po intake  Ct drainage decreasing, remove right chest  today   Grace Isaac 05/19/2015 8:02 AM

## 2015-05-20 ENCOUNTER — Inpatient Hospital Stay (HOSPITAL_COMMUNITY): Payer: Commercial Managed Care - PPO | Admitting: Anesthesiology

## 2015-05-20 ENCOUNTER — Encounter (HOSPITAL_COMMUNITY)
Admission: RE | Disposition: A | Discharge status: Home Health | Payer: Self-pay | Source: Ambulatory Visit | Attending: Cardiothoracic Surgery

## 2015-05-20 ENCOUNTER — Inpatient Hospital Stay (HOSPITAL_COMMUNITY): Payer: Commercial Managed Care - PPO

## 2015-05-20 DIAGNOSIS — I9789 Other postprocedural complications and disorders of the circulatory system, not elsewhere classified: Secondary | ICD-10-CM

## 2015-05-20 HISTORY — PX: MASS EXCISION: SHX2000

## 2015-05-20 LAB — GLUCOSE, CAPILLARY
Glucose-Capillary: 147 mg/dL — ABNORMAL HIGH (ref 65–99)
Glucose-Capillary: 160 mg/dL — ABNORMAL HIGH (ref 65–99)
Glucose-Capillary: 206 mg/dL — ABNORMAL HIGH (ref 65–99)
Glucose-Capillary: 177 mg/dL — ABNORMAL HIGH (ref 65–99)
Glucose-Capillary: 155 mg/dL — ABNORMAL HIGH (ref 65–99)
Glucose-Capillary: 175 mg/dL — ABNORMAL HIGH (ref 65–99)

## 2015-05-20 LAB — CBC
HCT: 25.3 % — ABNORMAL LOW (ref 36.0–46.0)
Hemoglobin: 8.7 g/dL — ABNORMAL LOW (ref 12.0–15.0)
MCH: 29.9 pg (ref 26.0–34.0)
MCHC: 34.4 g/dL (ref 30.0–36.0)
MCV: 86.9 fL (ref 78.0–100.0)
Platelets: 263 10*3/uL (ref 150–400)
RBC: 2.91 MIL/uL — ABNORMAL LOW (ref 3.87–5.11)
RDW: 15.3 % (ref 11.5–15.5)
WBC: 12.5 10*3/uL — ABNORMAL HIGH (ref 4.0–10.5)

## 2015-05-20 LAB — BASIC METABOLIC PANEL
Anion gap: 7 (ref 5–15)
BUN: 20 mg/dL (ref 6–20)
CO2: 28 mmol/L (ref 22–32)
Calcium: 8.1 mg/dL — ABNORMAL LOW (ref 8.9–10.3)
Chloride: 101 mmol/L (ref 101–111)
Creatinine, Ser: 0.86 mg/dL (ref 0.44–1.00)
GFR calc Af Amer: >60 mL/min (ref >60)
GFR calc non Af Amer: >60 mL/min (ref >60)
Glucose, Bld: 175 mg/dL — ABNORMAL HIGH (ref 65–99)
Potassium: 4.2 mmol/L (ref 3.5–5.1)
Sodium: 136 mmol/L (ref 135–145)

## 2015-05-20 SURGERY — EXCISION MASS
Anesthesia: Monitor Anesthesia Care | Laterality: Left

## 2015-05-20 MED ORDER — FENTANYL CITRATE (PF) 100 MCG/2ML IJ SOLN
INTRAMUSCULAR | Status: DC | PRN
Start: 2015-05-20 — End: 2015-05-20
  Administered 2015-05-20 (×3): 50 ug via INTRAVENOUS

## 2015-05-20 MED ORDER — LACTATED RINGERS IV SOLN
INTRAVENOUS | Status: DC | PRN
  Administered 2015-05-20 (×2): via INTRAVENOUS

## 2015-05-20 MED ORDER — MIDAZOLAM HCL 5 MG/5ML IJ SOLN
INTRAMUSCULAR | Status: DC | PRN
  Administered 2015-05-20: 2 mg via INTRAVENOUS

## 2015-05-20 MED ORDER — PROPOFOL INFUSION 10 MG/ML OPTIME
INTRAVENOUS | Status: DC | PRN
  Administered 2015-05-20: 50 ug/kg/min via INTRAVENOUS

## 2015-05-20 MED ORDER — LIDOCAINE HCL (PF) 1 % IJ SOLN
INTRAMUSCULAR | Status: AC
  Filled 2015-05-20: qty 30

## 2015-05-20 MED ORDER — PHENYLEPHRINE HCL 10 MG/ML IJ SOLN
10.0000 mg | INTRAVENOUS | Status: DC | PRN
  Administered 2015-05-20: 10 ug/min via INTRAVENOUS

## 2015-05-20 MED ORDER — FENTANYL CITRATE (PF) 250 MCG/5ML IJ SOLN
INTRAMUSCULAR | Status: AC
  Filled 2015-05-20: qty 5

## 2015-05-20 MED ORDER — LACTATED RINGERS IV SOLN
INTRAVENOUS | Status: DC
  Administered 2015-05-20 (×2): via INTRAVENOUS

## 2015-05-20 MED ORDER — LIDOCAINE HCL (PF) 1 % IJ SOLN
INTRAMUSCULAR | Status: DC | PRN
  Administered 2015-05-20: 30 mL

## 2015-05-20 MED ORDER — 0.9 % SODIUM CHLORIDE (POUR BTL) OPTIME
TOPICAL | Status: DC | PRN
  Administered 2015-05-20: 1000 mL

## 2015-05-20 MED ORDER — FENTANYL CITRATE (PF) 100 MCG/2ML IJ SOLN
INTRAMUSCULAR | Status: AC
  Filled 2015-05-20: qty 2

## 2015-05-20 MED ORDER — MIDAZOLAM HCL 2 MG/2ML IJ SOLN
INTRAMUSCULAR | Status: AC
  Filled 2015-05-20: qty 2

## 2015-05-20 MED ORDER — FENTANYL CITRATE (PF) 100 MCG/2ML IJ SOLN
25.0000 ug | INTRAMUSCULAR | Status: DC | PRN
  Administered 2015-05-20: 50 ug via INTRAVENOUS

## 2015-05-20 SURGICAL SUPPLY — 39 items
BLADE SURG 10 STRL SS (BLADE) ×2 IMPLANT
BNDG CONFORM 2 STRL LF (GAUZE/BANDAGES/DRESSINGS) ×2 IMPLANT
BOOT SUTURE AID YELLOW STND (SUTURE) IMPLANT
CATH ROBINSON RED A/P 18FR (CATHETERS) IMPLANT
CATH SUCT 10FR WHISTLE TIP (CATHETERS) IMPLANT
DRAIN HEMOVAC 1/8 X 5 (WOUND CARE) IMPLANT
DRAIN PENROSE 1/2X12 LTX STRL (WOUND CARE) ×2 IMPLANT
DRAIN WOUND SNY 15 RND (WOUND CARE) IMPLANT
DRAPE INCISE IOBAN 66X45 STRL (DRAPES) IMPLANT
DRSG AQUACEL AG ADV 3.5X14 (GAUZE/BANDAGES/DRESSINGS) ×2 IMPLANT
DRSG COVADERM 4X6 (GAUZE/BANDAGES/DRESSINGS) IMPLANT
ELECT CAUTERY BLADE 6.4 (BLADE) ×2 IMPLANT
EVACUATOR SILICONE 100CC (DRAIN) IMPLANT
GAUZE SPONGE 2X2 8PLY STRL LF (GAUZE/BANDAGES/DRESSINGS) ×1 IMPLANT
GAUZE SPONGE 4X4 12PLY STRL (GAUZE/BANDAGES/DRESSINGS) ×2 IMPLANT
INSERT FOGARTY SM (MISCELLANEOUS) IMPLANT
KIT CATH SUCT 8FR (CATHETERS) ×2 IMPLANT
KIT ROOM TURNOVER OR (KITS) ×2 IMPLANT
LOOP VESSEL MAXI BLUE (MISCELLANEOUS) IMPLANT
LOOP VESSEL MINI RED (MISCELLANEOUS) IMPLANT
NEEDLE 22X1 1/2 (OR ONLY) (NEEDLE) ×2 IMPLANT
PAD ARMBOARD 7.5X6 YLW CONV (MISCELLANEOUS) ×2 IMPLANT
PENCIL BUTTON HOLSTER BLD 10FT (ELECTRODE) ×2 IMPLANT
SHUNT CAROTID BYPASS 10 (VASCULAR PRODUCTS) IMPLANT
SHUNT CAROTID BYPASS 12FRX15.5 (VASCULAR PRODUCTS) IMPLANT
SPONGE GAUZE 2X2 STER 10/PKG (GAUZE/BANDAGES/DRESSINGS) ×1
SPONGE GAUZE 4X4 12PLY STER LF (GAUZE/BANDAGES/DRESSINGS) ×2 IMPLANT
SPONGE INTESTINAL PEANUT (DISPOSABLE) IMPLANT
SUT BONE WAX W31G (SUTURE) ×2 IMPLANT
SUT ETHILON 3 0 FSL (SUTURE) ×2 IMPLANT
SUT PROLENE 6 0 BV (SUTURE) IMPLANT
SUT PROLENE 6 0 CC (SUTURE) IMPLANT
SUT SILK 3 0 (SUTURE)
SUT SILK 3-0 18XBRD TIE 12 (SUTURE) IMPLANT
SUT VIC AB 3-0 X1 27 (SUTURE) IMPLANT
SUT VICRYL 4-0 PS2 18IN ABS (SUTURE) IMPLANT
SYR CONTROL 10ML LL (SYRINGE) ×2 IMPLANT
TAPE CLOTH SURG 4X10 WHT LF (GAUZE/BANDAGES/DRESSINGS) ×2 IMPLANT
TUBE CONNECTING 12X1/4 (SUCTIONS) ×2 IMPLANT

## 2015-05-20 NOTE — Transfer of Care (Signed)
Immediate Anesthesia Transfer of Care Note  Patient: Sharon Price  Procedure(s) Performed: Procedure(s): EXPLORATION OF LEFT NECK (Left)  Patient Location: PACU  Anesthesia Type:MAC  Level of Consciousness: awake  Airway & Oxygen Therapy: Patient Spontanous Breathing and Patient connected to nasal cannula oxygen  Post-op Assessment: Report given to RN and Post -op Vital signs reviewed and stable  Post vital signs: Reviewed and stable  Last Vitals:  Filed Vitals:   05/20/15 1400  BP: 110/51  Pulse: 82  Temp:   Resp: 20    Complications: No apparent anesthesia complications

## 2015-05-20 NOTE — Anesthesia Postprocedure Evaluation (Signed)
Anesthesia Post Note  Patient: Sharon Price  Procedure(s) Performed: Procedure(s) (LRB): EXPLORATION OF LEFT NECK (Left)  Anesthesia type: MAC  Patient location: PACU  Post pain: Pain level controlled  Post assessment: Patient's Cardiovascular Status Stable  Last Vitals:  Filed Vitals:   05/20/15 1855  BP: 105/47  Pulse: 82  Temp: 37.1 C  Resp: 20    Post vital signs: Reviewed and stable  Level of consciousness: sedated  Complications: No apparent anesthesia complications

## 2015-05-20 NOTE — Progress Notes (Signed)
Patient ID: Sharon Price, female   DOB: 12-03-1945, 69 y.o.   MRN: [id number] TCTS DAILY ICU PROGRESS NOTE                   [address]            [place] [address]          [phone]   10 Days Post-Op Procedure(s) (LRB): VIDEO BRONCHOSCOPY (N/A) TRANSHIATAL TOTAL ESOPHAGEAL RESECTION (N/A) FEEDING JEJUNOSTOMY (N/A)  Total Length of Stay:  LOS: 10 days   Subjective: Feels ok  Objective: Vital signs in last 24 hours: Temp:  [98.5 F (36.9 C)-99.3 F (37.4 C)] 98.6 F (37 C) (07/22 0356) Pulse Rate:  [78-95] 88 (07/22 0600) Cardiac Rhythm:  [-] Normal sinus rhythm (07/22 0600) Resp:  [17-28] 17 (07/22 0600) BP: (95-127)/(41-64) 120/59 mmHg (07/22 0600) SpO2:  [93 %-98 %] 97 % (07/22 0717) Weight:  [164 lb 0.4 oz (74.4 kg)] 164 lb 0.4 oz (74.4 kg) (07/22 0544)  Filed Weights   05/18/15 0500 05/19/15 0500 05/20/15 0544  Weight: 159 lb 9.8 oz (72.4 kg) 157 lb 13.6 oz (71.6 kg) 164 lb 0.4 oz (74.4 kg)    Weight change: 6 lb 2.8 oz (2.8 kg)   Hemodynamic parameters for last 24 hours:    Intake/Output from previous day: 07/21 0701 - 07/22 0700 In: 710.1 [I.V.:290.1; NG/GT:300] Out: 565 [Urine:325; Chest Tube:240]  Intake/Output this shift:    Current Meds: Scheduled Meds: . amiodarone  200 mg Oral BID  . antiseptic oral rinse  7 mL Mouth Rinse QID  . chlorhexidine  15 mL Mouth Rinse BID  . enoxaparin (LOVENOX) injection  30 mg Subcutaneous Q12H  . insulin aspart  0-24 Units Subcutaneous 6 times per day  . insulin detemir  10 Units Subcutaneous Daily  . levalbuterol  0.63 mg Nebulization TID  . metoCLOPramide  10 mg Per Tube 4 times per day  . metoprolol tartrate  12.5 mg Per Tube BID  . pantoprazole (PROTONIX) IV  40 mg Intravenous Q12H  . sodium chloride  10 mL Intravenous Q12H   Continuous Infusions: . sodium chloride 10 mL/hr at 05/19/15 2000  . feeding supplement (VITAL AF 1.2 CAL) Stopped (05/20/15 0000)   PRN Meds:.oxyCODONE  **AND** acetaminophen, diphenhydrAMINE, Tim Corriher's butt cream, hydrALAZINE, potassium chloride  General appearance: alert, cooperative and no distress Neurologic: intact Heart: regular rate and rhythm, S1, S2 normal, no murmur, click, rub or gallop Lungs: clear to auscultation bilaterally Abdomen: soft, non-tender; bowel sounds normal; no masses,  no organomegaly and small amt drainage at j tube site Extremities: extremities normal, atraumatic, no cyanosis or edema and Homans sign is negative, no sign of DVT Wound: drainage from left neck penrose decreased from yesterday  Lab Results: CBC: Recent Labs  05/19/15 0519 05/20/15 0519  WBC 14.6* 12.5*  HGB 8.9* 8.7*  HCT 26.3* 25.3*  PLT 208 263   BMET:  Recent Labs  05/19/15 0519 05/20/15 0519  NA 135 136  K 4.0 4.2  CL 100* 101  CO2 26 28  GLUCOSE 230* 175*  BUN 22* 20  CREATININE 0.92 0.86  CALCIUM 8.1* 8.1*    PT/INR: No results for input(s): LABPROT, INR in the last 72 hours. Radiology: No results found.   Assessment/Plan: S/P Procedure(s) (LRB): VIDEO BRONCHOSCOPY (N/A) TRANSHIATAL TOTAL ESOPHAGEAL RESECTION (N/A) FEEDING JEJUNOSTOMY (N/A) Wbc drcreasing Plan exploration of left neck incision, anastomotic leak  The goals risks and alternatives of the planned  surgical procedure exploration of left neck incision,  have been discussed with the patient in detail. The risks of the procedure including death, infection, stroke, , bleeding, blood transfusion have all been discussed specifically.  I have quoted Casilda Carls a 1% of perioperative mortality and a complication rate as high as 10%. The patient's questions have been answered.EDIT RICCIARDELLI is willing  to proceed with the planned procedure.     Grace Isaac 05/20/2015 7:30 AM

## 2015-05-20 NOTE — Progress Notes (Signed)
Report called and Pt transferred to short stay via Bed. Hooked up to monitor and new nurse at bedside. No complications.  Eleonore Chiquito RN. [place].

## 2015-05-20 NOTE — Brief Op Note (Signed)
      [address]       [place] [address]             [phone]      05/20/2015  6:05 PM  PATIENT:  Sharon Price  69 y.o. female  PRE-OPERATIVE DIAGNOSIS:  anastomotic leak cervical esophagogastrostomy   POST-OPERATIVE DIAGNOSIS:  same PROCEDURE:  Procedure(s): EXPLORATION OF LEFT NECK (Left) and packing of left neck incision  SURGEON:  Surgeon(s) and Role:    * Grace Isaac, MD - Primary   ANESTHESIA:   MAC  EBL:  Total I/O In: 4695 [I.V.:1050; Other:170] Out: 270 [Urine:200; Chest Tube:70]  BLOOD ADMINISTERED:none  DRAINS: Penrose drain in the left neck   LOCAL MEDICATIONS USED:  LIDOCAINE   SPECIMEN:  No Specimen  DISPOSITION OF SPECIMEN:  N/A  COUNTS:  YES  TOURNIQUET:  * No tourniquets in log *  DICTATION: .Dragon Dictation  PLAN OF CARE: already inpatient  PATIENT DISPOSITION:  PACU - hemodynamically stable.   Delay start of Pharmacological VTE agent (>24hrs) due to surgical blood loss or risk of bleeding: yes

## 2015-05-20 NOTE — Anesthesia Preprocedure Evaluation (Addendum)
Anesthesia Evaluation  Patient identified by MRN, date of birth, ID band Patient awake    Reviewed: Allergy & Precautions, NPO status , Patient's Chart, lab work & pertinent test results  Airway Mallampati: II  TM Distance: >3 FB Neck ROM: Full    Dental  (+) Edentulous Upper, Edentulous Lower   Pulmonary former smoker,  breath sounds clear to auscultation        Cardiovascular hypertension, Rhythm:Regular     Neuro/Psych    GI/Hepatic PUD, GERD-  ,  Endo/Other  diabetes  Renal/GU      Musculoskeletal   Abdominal   Peds  Hematology   Anesthesia Other Findings   Reproductive/Obstetrics                            Anesthesia Physical Anesthesia Plan  ASA: III  Anesthesia Plan: MAC   Post-op Pain Management:    Induction:   Airway Management Planned:   Additional Equipment:   Intra-op Plan:   Post-operative Plan:   Informed Consent:   Plan Discussed with:   Anesthesia Plan Comments:         Anesthesia Quick Evaluation

## 2015-05-20 NOTE — Progress Notes (Signed)
Copious amount of Green exudate noted coming out of back of dressing and running down patients back. Patient states the she felt it "squirt" out when she coughed. Dressing saturated---Reinforced with 4x4 guaze.

## 2015-05-20 NOTE — Progress Notes (Signed)
Patient ID: Sharon Price, female   DOB: [birth year], 69 y.o.   MRN: [id number]  SICU Evening Rounds:  Hemodynamically stable after exploration of neck incision today for anastomotic leak.  sats 99%  Sitting up in bed

## 2015-05-20 NOTE — Progress Notes (Signed)
Physical Therapy Treatment Patient Details Name: Sharon Price MRN: [id number] DOB: 29-Jan-1946 Today's Date: 05/20/2015    History of Present Illness Adm for surgical esophagectomy 712; (pt s/p esophageal cancer and has completed chemotherapy/radiation. Her treatment course has been complicated with severe dehydration poor nutrition, requiring a PEG tube to be placed which became infected and had to be replaced several times. PMHx- DM, PE, CKD    PT Comments    Pt expressed her disappointment re: need to return to OR and reports not feeling as well today (abd pain, general fatigue). She was willing to do as much as she could with therapy. Initiated standing balance activities with immediate need for BSC. Once completed able to resume balance activities with either unilateral or bilateral UE support with only minguard assist.    Follow Up Recommendations  Supervision/Assistance - 24 hour;Home health PT     Equipment Recommendations  None recommended by PT    Recommendations for Other Services OT consult     Precautions / Restrictions Precautions Precautions: Fall Precaution Comments: chest tubes, Drain LUQ, abdominal incision, right IJ line Restrictions Weight Bearing Restrictions: No    Mobility  Bed Mobility Overal bed mobility: Needs Assistance Bed Mobility: Rolling;Sidelying to Sit Rolling: Supervision Sidelying to sit: Min assist;HOB elevated       General bed mobility comments: Assist to raise torso due to pain and weakness  Transfers Overall transfer level: Needs assistance Equipment used: Rolling walker (2 wheeled) Transfers: Sit to/from [facility] Sit to Stand: Min guard Stand pivot transfers: Min guard       General transfer comment: x 3; vc for sequencing/technique; vc for full upright position (pt avoiding due to abd pain)  Ambulation/Gait                 Stairs            Wheelchair Mobility    Modified Rankin  (Stroke Patients Only)       Balance   Sitting-balance support: No upper extremity supported;Feet supported Sitting balance-Leahy Scale: Fair     Standing balance support: Single extremity supported Standing balance-Leahy Scale: Poor Standing balance comment: stood for up to 3 minutes at a time; limited by fatigue               High Level Balance Comments: reaches 5" with each UE (with single UE support on RW); marching x 5 each leg; toe raises; heelraises; minisquats all with bil UE support on RW and minguard assist    Cognition Arousal/Alertness: Awake/alert Behavior During Therapy: Flat affect (disappointed re: need for return to OR) Overall Cognitive Status: Within Functional Limits for tasks assessed                      Exercises      General Comments        Pertinent Vitals/Pain Pain Assessment: Faces Faces Pain Scale: Hurts little more Pain Location: abd Pain Intervention(s): Limited activity within patient's tolerance;Monitored during session;Repositioned    Home Living                      Prior Function            PT Goals (current goals can now be found in the care plan section) Acute Rehab PT Goals Patient Stated Goal: Return to PLOF Time For Goal Achievement: 05/27/15 Progress towards PT goals: Progressing toward goals    Frequency  Min 3X/week  PT Plan Current plan remains appropriate    Co-evaluation             End of Session   Activity Tolerance: Patient limited by fatigue Patient left: with call bell/phone within reach;in chair     Time: 5789-7847 PT Time Calculation (min) (ACUTE ONLY): 18 min  Charges:  $Therapeutic Activity: 8-22 mins                    G Codes:      [id number],[id number] 05-28-15, 2:32 PM Pager [phone]

## 2015-05-21 ENCOUNTER — Inpatient Hospital Stay (HOSPITAL_COMMUNITY): Payer: Commercial Managed Care - PPO

## 2015-05-21 LAB — BASIC METABOLIC PANEL
Anion gap: 7 (ref 5–15)
BUN: 18 mg/dL (ref 6–20)
CO2: 28 mmol/L (ref 22–32)
Calcium: 8.2 mg/dL — ABNORMAL LOW (ref 8.9–10.3)
Chloride: 103 mmol/L (ref 101–111)
Creatinine, Ser: 0.94 mg/dL (ref 0.44–1.00)
GFR calc Af Amer: >60 mL/min (ref >60)
GFR calc non Af Amer: >60 mL/min (ref >60)
Glucose, Bld: 109 mg/dL — ABNORMAL HIGH (ref 65–99)
Potassium: 4.2 mmol/L (ref 3.5–5.1)
Sodium: 138 mmol/L (ref 135–145)

## 2015-05-21 LAB — CBC
HCT: 23.2 % — ABNORMAL LOW (ref 36.0–46.0)
Hemoglobin: 7.8 g/dL — ABNORMAL LOW (ref 12.0–15.0)
MCH: 30.1 pg (ref 26.0–34.0)
MCHC: 33.6 g/dL (ref 30.0–36.0)
MCV: 89.6 fL (ref 78.0–100.0)
Platelets: 270 10*3/uL (ref 150–400)
RBC: 2.59 MIL/uL — ABNORMAL LOW (ref 3.87–5.11)
RDW: 15.8 % — ABNORMAL HIGH (ref 11.5–15.5)
WBC: 10.7 10*3/uL — ABNORMAL HIGH (ref 4.0–10.5)

## 2015-05-21 LAB — GLUCOSE, CAPILLARY
Glucose-Capillary: 100 mg/dL — ABNORMAL HIGH (ref 65–99)
Glucose-Capillary: 142 mg/dL — ABNORMAL HIGH (ref 65–99)
Glucose-Capillary: 206 mg/dL — ABNORMAL HIGH (ref 65–99)
Glucose-Capillary: 232 mg/dL — ABNORMAL HIGH (ref 65–99)
Glucose-Capillary: 254 mg/dL — ABNORMAL HIGH (ref 65–99)
Glucose-Capillary: 266 mg/dL — ABNORMAL HIGH (ref 65–99)

## 2015-05-21 MED ORDER — ALBUMIN HUMAN 5 % IV SOLN
12.5000 g | Freq: Once | INTRAVENOUS | Status: AC
  Administered 2015-05-21: 12.5 g via INTRAVENOUS
  Filled 2015-05-21: qty 250

## 2015-05-21 MED ORDER — PIPERACILLIN-TAZOBACTAM 3.375 G IVPB
3.3750 g | Freq: Three times a day (TID) | INTRAVENOUS | Status: DC
  Administered 2015-05-21 – 2015-06-08 (×53): 3.375 g via INTRAVENOUS
  Filled 2015-05-21 (×54): qty 50

## 2015-05-21 MED ORDER — PIPERACILLIN-TAZOBACTAM 3.375 G IVPB: 3.3750 g | Freq: Four times a day (QID) | INTRAVENOUS | Status: DC

## 2015-05-21 MED ORDER — MORPHINE SULFATE 4 MG/ML IJ SOLN
4.0000 mg | Freq: Every day | INTRAMUSCULAR | Status: DC | PRN
  Administered 2015-05-22 – 2015-05-25 (×2): 4 mg via INTRAVENOUS
  Filled 2015-05-21 (×2): qty 1

## 2015-05-21 MED ORDER — FLUCONAZOLE 40 MG/ML PO SUSR
400.0000 mg | ORAL | Status: DC
  Administered 2015-05-21 – 2015-06-07 (×17): 400 mg
  Filled 2015-05-21 (×19): qty 10

## 2015-05-21 MED ORDER — FUROSEMIDE 10 MG/ML IJ SOLN
40.0000 mg | Freq: Once | INTRAMUSCULAR | Status: AC
  Administered 2015-05-21: 40 mg via INTRAVENOUS
  Filled 2015-05-21: qty 4

## 2015-05-21 NOTE — Progress Notes (Signed)
Left neck dressing changed and reinforced several times, draining large amount of greenish output with mucous secretions thick to loose and foul smelling. Drains more when patient coughs. Penrose drain intact will continue to reinforced as needed.

## 2015-05-21 NOTE — Progress Notes (Signed)
      [address]       [place] [address]             [phone]     The left neck with marked purulent, foul smelling drainage.  The  dressing has been reinforced multiple times by nursing staff.  I took down dressing and repacked with saline soaked sterile gauze. I did not realize it had not been cultured in OR. Will have nursing culture the drainage . The wound looks pretty clean but will need to consider starting antibiotics.    GOLD,WAYNE E, PA-C

## 2015-05-21 NOTE — Progress Notes (Signed)
1 Day Post-Op Procedure(s) (LRB): EXPLORATION OF LEFT NECK (Left) Subjective:  Sore throat  Neck wound dressing changed by Patrick Jupiter this am and reportedly purulent, foul-smelling with a lot of drainage.  She ambulated more today  Objective: Vital signs in last 24 hours: Temp:  [97.9 F (36.6 C)-99.7 F (37.6 C)] 99.7 F (37.6 C) (07/23 1557) Pulse Rate:  [73-90] 90 (07/23 1600) Cardiac Rhythm:  [-] Normal sinus rhythm (07/23 1600) Resp:  [17-25] 23 (07/23 1600) BP: (81-154)/(33-73) 118/49 mmHg (07/23 1600) SpO2:  [93 %-100 %] 95 % (07/23 1600) Weight:  [73.982 kg (163 lb 1.6 oz)] 73.982 kg (163 lb 1.6 oz) (07/23 0500)  Hemodynamic parameters for last 24 hours:    Intake/Output from previous day: 07/22 0701 - 07/23 0700 In: 2235.3 [I.V.:1186.3; NG/GT:639] Out: 365 [Urine:200; Chest Tube:165] Intake/Output this shift: Total I/O In: 640 [I.V.:160; NG/GT:480] Out: -   General appearance: alert and cooperative Heart: regular rate and rhythm, S1, S2 normal, no murmur, click, rub or gallop Lungs: clear to auscultation bilaterally Abdomen: soft, non-tender; bowel sounds normal; no masses,  no organomegaly Extremities: extremities normal, atraumatic, no cyanosis or edema  Lab Results:  Recent Labs  05/20/15 0519 05/21/15 0520  WBC 12.5* 10.7*  HGB 8.7* 7.8*  HCT 25.3* 23.2*  PLT 263 270   BMET:  Recent Labs  05/20/15 0519 05/21/15 0520  NA 136 138  K 4.2 4.2  CL 101 103  CO2 28 28  GLUCOSE 175* 109*  BUN 20 18  CREATININE 0.86 0.94  CALCIUM 8.1* 8.2*    PT/INR: No results for input(s): LABPROT, INR in the last 72 hours. ABG    Component Value Date/Time   PHART 7.353 05/11/2015 0356   HCO3 20.8 05/11/2015 0356   TCO2 22 05/11/2015 0356   ACIDBASEDEF 4.0* 05/11/2015 0356   O2SAT 94.0 05/11/2015 0356   CBG (last 3)   Recent Labs  05/21/15 0805 05/21/15 1202 05/21/15 1556  GLUCAP 232* 266* 142*    CLINICAL DATA: Esophageal  carcinoma  EXAM: PORTABLE CHEST - 1 VIEW  COMPARISON: 05/20/2015  FINDINGS: Right central line is again identified and stable. A left thoracostomy catheter is again seen. Aortic calcifications are noted. Stable atelectasis is noted in the left lung base. No pneumothorax is noted. Slight increased vascular congestion is noted with mild right pleural effusion.  IMPRESSION: Increased vascular congestion.  Tubes and lines as described above.  Stable left basilar atelectasis.   Electronically Signed  By: Inez Catalina M.D.  On: 05/21/2015 07:54     Assessment/Plan:  Cervical esophagogastric anastomotic leak. Continue daily dressing changes. With copious purulent foul-smelling drainage will start Zosyn and Diflucan.   Continue tube feeds   Ambulation and IS  Positive 1700 cc past 24 hrs and CXR shows vascular congestion so will give some lasix.  LOS: 11 days    Gaye Pollack 05/21/2015

## 2015-05-21 NOTE — Progress Notes (Signed)
Pts. SBP was in the low 80"s with MAP of 50's, pt. Denies any discomfort, with good relief from pain med earlier. Dr. Cyndia Bent made aware with orders made. Albumin IV x 1 dose was given. Pt. Tolerated well. Will continue to monitor VS.

## 2015-05-22 ENCOUNTER — Inpatient Hospital Stay (HOSPITAL_COMMUNITY): Payer: Commercial Managed Care - PPO

## 2015-05-22 LAB — BASIC METABOLIC PANEL
Anion gap: 6 (ref 5–15)
BUN: 19 mg/dL (ref 6–20)
CO2: 29 mmol/L (ref 22–32)
Calcium: 7.8 mg/dL — ABNORMAL LOW (ref 8.9–10.3)
Chloride: 100 mmol/L — ABNORMAL LOW (ref 101–111)
Creatinine, Ser: 0.89 mg/dL (ref 0.44–1.00)
GFR calc non Af Amer: >60 mL/min (ref >60)
Glucose, Bld: 205 mg/dL — ABNORMAL HIGH (ref 65–99)
Potassium: 3.5 mmol/L (ref 3.5–5.1)
Sodium: 135 mmol/L (ref 135–145)

## 2015-05-22 LAB — GLUCOSE, CAPILLARY
Glucose-Capillary: 147 mg/dL — ABNORMAL HIGH (ref 65–99)
Glucose-Capillary: 179 mg/dL — ABNORMAL HIGH (ref 65–99)
Glucose-Capillary: 183 mg/dL — ABNORMAL HIGH (ref 65–99)
Glucose-Capillary: 183 mg/dL — ABNORMAL HIGH (ref 65–99)
Glucose-Capillary: 216 mg/dL — ABNORMAL HIGH (ref 65–99)
Glucose-Capillary: 239 mg/dL — ABNORMAL HIGH (ref 65–99)
Glucose-Capillary: 240 mg/dL — ABNORMAL HIGH (ref 65–99)

## 2015-05-22 LAB — CBC
HCT: 21.7 % — ABNORMAL LOW (ref 36.0–46.0)
HEMOGLOBIN: 7.1 g/dL — AB (ref 12.0–15.0)
MCH: 29.3 pg (ref 26.0–34.0)
MCHC: 32.7 g/dL (ref 30.0–36.0)
MCV: 89.7 fL (ref 78.0–100.0)
PLATELETS: 311 10*3/uL (ref 150–400)
RBC: 2.42 MIL/uL — ABNORMAL LOW (ref 3.87–5.11)
RDW: 15.4 % (ref 11.5–15.5)
WBC: 9.9 10*3/uL (ref 4.0–10.5)

## 2015-05-22 LAB — PREPARE RBC (CROSSMATCH)

## 2015-05-22 MED ORDER — SODIUM CHLORIDE 0.9 % IV SOLN
Freq: Once | INTRAVENOUS | Status: AC
  Administered 2015-05-22: 11:00:00 via INTRAVENOUS

## 2015-05-22 MED ORDER — POTASSIUM CHLORIDE 20 MEQ/15ML (10%) PO SOLN
40.0000 meq | Freq: Once | ORAL | Status: AC
  Administered 2015-05-22: 40 meq
  Filled 2015-05-22 (×2): qty 30

## 2015-05-22 MED ORDER — FUROSEMIDE 10 MG/ML IJ SOLN
40.0000 mg | Freq: Once | INTRAMUSCULAR | Status: AC
  Administered 2015-05-22: 40 mg via INTRAVENOUS
  Filled 2015-05-22: qty 4

## 2015-05-22 MED ORDER — INSULIN DETEMIR 100 UNIT/ML ~~LOC~~ SOLN
15.0000 [IU] | Freq: Two times a day (BID) | SUBCUTANEOUS | Status: DC
  Administered 2015-05-22 – 2015-05-27 (×12): 15 [IU] via SUBCUTANEOUS
  Filled 2015-05-22 (×15): qty 0.15

## 2015-05-22 NOTE — Progress Notes (Signed)
2 Days Post-Op Procedure(s) (LRB): EXPLORATION OF LEFT NECK (Left) Subjective:  No complaints  Objective: Vital signs in last 24 hours: Temp:  [98.1 F (36.7 C)-99.7 F (37.6 C)] 98.1 F (36.7 C) (07/24 0804) Pulse Rate:  [71-94] 81 (07/24 0800) Cardiac Rhythm:  [-] Normal sinus rhythm (07/24 0800) Resp:  [18-25] 18 (07/24 0800) BP: (84-127)/(39-51) 89/49 mmHg (07/24 0800) SpO2:  [89 %-100 %] 97 % (07/24 0828)  Hemodynamic parameters for last 24 hours:    Intake/Output from previous day: 07/23 0701 - 07/24 0700 In: 2378 [I.V.:638; NG/GT:1440; IV Piggyback:150] Out: 1751 [Urine:1275; Stool:2; Chest Tube:60] Intake/Output this shift: Total I/O In: 150 [I.V.:10; Other:30; NG/GT:60; IV Piggyback:50] Out: -   General appearance: alert and cooperative Heart: regular rate and rhythm, S1, S2 normal, no murmur, click, rub or gallop Lungs: clear to auscultation bilaterally Abdomen: soft, non-tender; bowel sounds normal; no masses,  no organomegaly Wound: left neck dressing changed. The wound if fairly clean. Copious drainage from the penrose drain. Culture sent  Lab Results:  Recent Labs  05/21/15 0520 05/22/15 0416  WBC 10.7* 9.9  HGB 7.8* 7.1*  HCT 23.2* 21.7*  PLT 270 311   BMET:  Recent Labs  05/21/15 0520 05/22/15 0416  NA 138 135  K 4.2 3.5  CL 103 100*  CO2 28 29  GLUCOSE 109* 205*  BUN 18 19  CREATININE 0.94 0.89  CALCIUM 8.2* 7.8*    PT/INR: No results for input(s): LABPROT, INR in the last 72 hours. ABG    Component Value Date/Time   PHART 7.353 05/11/2015 0356   HCO3 20.8 05/11/2015 0356   TCO2 22 05/11/2015 0356   ACIDBASEDEF 4.0* 05/11/2015 0356   O2SAT 94.0 05/11/2015 0356   CBG (last 3)   Recent Labs  05/22/15 0002 05/22/15 0404 05/22/15 0759  GLUCAP 240* 183* 179*    Assessment/Plan:  S/p esophagectomy with postop cervical anastomotic leak. Continue daily wound dressing changes. She is on Zosyn and Diflucan pending cultures. She  is afebrile and WBC down to normal after wound opened up.  Chest tube output minimal and CXR shows no effusion. Will remove chest tube.  She has progressive anemia with no visible blood loss. Hgb down to 7.1 and BP is soft so will transfuse one unit.   Continue tube feeds and mobilization.   LOS: 12 days    Gaye Pollack 05/22/2015

## 2015-05-22 NOTE — Progress Notes (Deleted)
Pt placed on PSV 5/5 by Dr. Lamonte Sakai per RN

## 2015-05-23 ENCOUNTER — Inpatient Hospital Stay (HOSPITAL_COMMUNITY): Payer: Commercial Managed Care - PPO

## 2015-05-23 ENCOUNTER — Encounter (HOSPITAL_COMMUNITY): Payer: Self-pay | Admitting: Cardiothoracic Surgery

## 2015-05-23 LAB — GLUCOSE, CAPILLARY
Glucose-Capillary: 120 mg/dL — ABNORMAL HIGH (ref 65–99)
Glucose-Capillary: 135 mg/dL — ABNORMAL HIGH (ref 65–99)
Glucose-Capillary: 196 mg/dL — ABNORMAL HIGH (ref 65–99)
Glucose-Capillary: 157 mg/dL — ABNORMAL HIGH (ref 65–99)

## 2015-05-23 LAB — CBC
HCT: 27.3 % — ABNORMAL LOW (ref 36.0–46.0)
Hemoglobin: 9.3 g/dL — ABNORMAL LOW (ref 12.0–15.0)
MCH: 30.2 pg (ref 26.0–34.0)
MCHC: 34.1 g/dL (ref 30.0–36.0)
MCV: 88.6 fL (ref 78.0–100.0)
Platelets: 329 10*3/uL (ref 150–400)
RBC: 3.08 MIL/uL — AB (ref 3.87–5.11)
RDW: 16 % — ABNORMAL HIGH (ref 11.5–15.5)
WBC: 12.2 10*3/uL — ABNORMAL HIGH (ref 4.0–10.5)

## 2015-05-23 LAB — TYPE AND SCREEN
ABO/RH(D): A POS
Antibody Screen: NEGATIVE
Unit division: 0

## 2015-05-23 MED ORDER — VITAL AF 1.2 CAL PO LIQD
1000.0000 mL | ORAL | Status: DC
  Administered 2015-05-23 – 2015-06-16 (×29): 1000 mL
  Filled 2015-05-23 (×59): qty 1000

## 2015-05-23 NOTE — Progress Notes (Signed)
Physical Therapy Treatment Patient Details Name: Sharon Price MRN: [id number] DOB: 10-Dec-1945 Today's Date: 05/23/2015    History of Present Illness Adm for surgical esophagectomy 7/12; return to OR 7/20 for cervical anastomosis leak; (pt s/p esophageal cancer and has completed chemotherapy/radiation. Her treatment course has been complicated with severe dehydration poor nutrition, requiring a PEG tube to be placed which became infected and had to be replaced several times. PMHx- DM, PE, CKD    PT Comments    Pt very motivated and progressing well. Limited by fatigue, deconditioning and dyspnea. Balance improving and able to perform some standing activities without RW or UE support.   Follow Up Recommendations  Supervision/Assistance - 24 hour;Home health PT     Equipment Recommendations  None recommended by PT    Recommendations for Other Services OT consult     Precautions / Restrictions Precautions Precautions: Fall Precaution Comments:  Drain LUQ, abdominal incision, right IJ line Restrictions Weight Bearing Restrictions: No    Mobility  Bed Mobility                  Transfers Overall transfer level: Needs assistance Equipment used: Rolling walker (2 wheeled);None Transfers: Sit to/from [employer] to Stand: Min guard Stand pivot transfers: Min guard       General transfer comment: x 3; vc for full upright position (pt avoiding due to pain)  Ambulation/Gait Ambulation/Gait assistance: Min guard Ambulation Distance (Feet): 120 Feet Assistive device: Rolling walker (2 wheeled) Gait Pattern/deviations: Step-through pattern;Trunk flexed Gait velocity: improved Gait velocity interpretation: Below normal speed for age/gender General Gait Details: very flexed torso, not able to fully correct due to pain; one seated rest break after 60 ft   Stairs            Wheelchair Mobility    Modified Rankin (Stroke Patients Only)        Balance     Sitting balance-Leahy Scale: Fair     Standing balance support: No upper extremity supported Standing balance-Leahy Scale: Fair Standing balance comment: x30 sec static, no sway               High Level Balance Comments: step forward and back alternating legs x 5 each leg (minguard no UE support); toe raises; heelraises x 10 all with bil UE support on RW and minguard assist    Cognition Arousal/Alertness: Awake/alert Behavior During Therapy: WFL for tasks assessed/performed (birighter) Overall Cognitive Status: Within Functional Limits for tasks assessed                      Exercises      General Comments        Pertinent Vitals/Pain SaO2 on RA 94-96% throughout ambualtion  Pain Assessment: 0-10 Pain Score: 4  Pain Location: neck, anterior upper chest Pain Intervention(s): Limited activity within patient's tolerance;Monitored during session;Repositioned    Home Living                      Prior Function            PT Goals (current goals can now be found in the care plan section) Acute Rehab PT Goals Patient Stated Goal: Return to PLOF Time For Goal Achievement: 05/27/15 Progress towards PT goals: Progressing toward goals    Frequency  Min 3X/week    PT Plan Current plan remains appropriate    Co-evaluation  End of Session   Activity Tolerance: Patient limited by fatigue Patient left: with call bell/phone within reach;in chair;with nursing/sitter in room (nursing redressing neck )     Time: 2493-2419 PT Time Calculation (min) (ACUTE ONLY): 31 min  Charges:  $Gait Training: 8-22 mins $Therapeutic Activity: 8-22 mins                    G Codes:      Shahla Betsill 24-May-2015, 12:32 PM Pager [phone]

## 2015-05-23 NOTE — Progress Notes (Signed)
Nutrition Follow Up  DOCUMENTATION CODES:   Not applicable  INTERVENTION:    Recommend Vital AF 1.2 formula at goal rate of 70 ml/hr   TF regimen to provide 2016 kcals, 126 gm protein, 1361 ml of free water  NUTRITION DIAGNOSIS:   Inadequate oral intake related to inability to eat (s/p esophageal resection) as evidenced by NPO status, ongoing  GOAL:   Patient will meet greater than or equal to 90% of their needs,progressing  MONITOR:   TF tolerance, Weight trends, Labs, I & O's  ASSESSMENT:  69 y.o. 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 The patient has completed neoadjuvant chemotherapy radiation with weekly carboplatin and and paclltaxel in AUG 2015. Her treatment course has been complicated with severe dehydration poor nutrition, requiring a PEG tube to be placed which becameinfected and had to be replaced several times. The patient needed supplementary IV fluids through a PICC line.   Patient s/p procedures 7/12: VIDEO BRONCHOSCOPY  TRANSHIATAL TOTAL ESOPHAGEAL RESECTION FEEDING JEJUNOSTOMY   Patient s/p procedure 7/22: EXPLORATION OF LEFT NECK   Vital AF 1.2 formula currently infusing at goal rate of 60 ml/hr via J-tube providing 1728 kcals, 108 gm protein, 1168 ml of free water.  Tolerating well.  Chart reviewed.  Esophagram/barium swallow showed no leak.  Pt's neck wound dressing changed 7/23 - purulent, foul-smelling drainage.    Diet Order:  Diet NPO time specified  Skin:  Wound (see comment) (Incision on abdomen, L neck, and wound on L knee)  Last BM:  7/23  Height:   Ht Readings from Last 1 Encounters:  05/10/15 5\' 2"  (1.575 m)    Weight:   Wt Readings from Last 1 Encounters:  05/23/15 159 lb 13.3 oz (72.5 kg)    Ideal Body Weight:  50 kg  Wt Readings from Last 10 Encounters:  05/23/15 159 lb 13.3 oz (72.5 kg)  05/06/15 157 lb 11.2 oz  (71.532 kg)  04/28/15 156 lb (70.761 kg)  04/04/15 156 lb (70.761 kg)  03/31/15 151 lb (68.493 kg)  03/30/15 151 lb (68.493 kg)  03/15/15 151 lb (68.493 kg)  11/30/14 157 lb 3 oz (71.3 kg)  09/16/14 151 lb (68.493 kg)  08/10/14 136 lb (61.689 kg)    BMI:  Body mass index is 29.23 kg/(m^2).  Estimated Nutritional Needs:   Kcal:  2000-2200  Protein:  110-120 gm  Fluid:  2.0-2.2 L  EDUCATION NEEDS:   No education needs identified at this time  Arthur Holms, RD, LDN Pager #: [phone] After-Hours Pager #: [phone]

## 2015-05-23 NOTE — Progress Notes (Signed)
3 Days Post-Op Procedure(s) (LRB): EXPLORATION OF LEFT NECK (Left) Subjective: No complaints this PM "it drained a lot last night, not so much today"  Objective: Vital signs in last 24 hours: Temp:  [98.2 F (36.8 C)-99.7 F (37.6 C)] 98.8 F (37.1 C) (07/25 1500) Pulse Rate:  [69-87] 74 (07/25 1600) Cardiac Rhythm:  [-] Normal sinus rhythm (07/25 0825) Resp:  [11-22] 19 (07/25 1600) BP: (88-128)/(41-74) 128/63 mmHg (07/25 1600) SpO2:  [92 %-99 %] 95 % (07/25 1600) Weight:  [159 lb 13.3 oz (72.5 kg)] 159 lb 13.3 oz (72.5 kg) (07/25 0500)  Hemodynamic parameters for last 24 hours:    Intake/Output from previous day: 07/24 0701 - 07/25 0700 In: 2031.5 [I.V.:180; Blood:294; NG/GT:1260; IV Piggyback:237.5] Out: 4196 [QIWLN:9892; Chest Tube:120] Intake/Output this shift: Total I/O In: 770 [I.V.:90; Other:90; NG/GT:540; IV Piggyback:50] Out: 250 [Urine:250]  General appearance: alert, cooperative and no distress Heart: regular rate and rhythm Lungs: diminished breath sounds bibasilar Wound: packed, minimal drainage  Lab Results:  Recent Labs  05/22/15 0416 05/23/15 0450  WBC 9.9 12.2*  HGB 7.1* 9.3*  HCT 21.7* 27.3*  PLT 311 329   BMET:  Recent Labs  05/21/15 0520 05/22/15 0416  NA 138 135  K 4.2 3.5  CL 103 100*  CO2 28 29  GLUCOSE 109* 205*  BUN 18 19  CREATININE 0.94 0.89  CALCIUM 8.2* 7.8*    PT/INR: No results for input(s): LABPROT, INR in the last 72 hours. ABG    Component Value Date/Time   PHART 7.353 05/11/2015 0356   HCO3 20.8 05/11/2015 0356   TCO2 22 05/11/2015 0356   ACIDBASEDEF 4.0* 05/11/2015 0356   O2SAT 94.0 05/11/2015 0356   CBG (last 3)   Recent Labs  05/23/15 0833 05/23/15 1148 05/23/15 1548  GLUCAP 135* 196* 157*    Assessment/Plan: S/P Procedure(s) (LRB): EXPLORATION OF LEFT NECK (Left) -  Cervical anastomotic leak- NPO, zosyn,  J tube feedings- increase to 70 per Dietary recommendation   LOS: 13 days    Sharon Price 05/23/2015

## 2015-05-24 ENCOUNTER — Inpatient Hospital Stay (HOSPITAL_COMMUNITY): Payer: Commercial Managed Care - PPO

## 2015-05-24 LAB — GLUCOSE, CAPILLARY
Glucose-Capillary: 157 mg/dL — ABNORMAL HIGH (ref 65–99)
Glucose-Capillary: 207 mg/dL — ABNORMAL HIGH (ref 65–99)
Glucose-Capillary: 108 mg/dL — ABNORMAL HIGH (ref 65–99)
Glucose-Capillary: 153 mg/dL — ABNORMAL HIGH (ref 65–99)
Glucose-Capillary: 171 mg/dL — ABNORMAL HIGH (ref 65–99)
Glucose-Capillary: 139 mg/dL — ABNORMAL HIGH (ref 65–99)
Glucose-Capillary: 148 mg/dL — ABNORMAL HIGH (ref 65–99)

## 2015-05-24 LAB — BASIC METABOLIC PANEL
ANION GAP: 7 (ref 5–15)
BUN: 21 mg/dL — AB (ref 6–20)
CALCIUM: 8.4 mg/dL — AB (ref 8.9–10.3)
CO2: 30 mmol/L (ref 22–32)
Chloride: 97 mmol/L — ABNORMAL LOW (ref 101–111)
Creatinine, Ser: 0.98 mg/dL (ref 0.44–1.00)
GFR calc Af Amer: >60 mL/min (ref >60)
GFR calc non Af Amer: 58 mL/min — ABNORMAL LOW (ref >60)
Glucose, Bld: 120 mg/dL — ABNORMAL HIGH (ref 65–99)
POTASSIUM: 4 mmol/L (ref 3.5–5.1)
SODIUM: 134 mmol/L — AB (ref 135–145)

## 2015-05-24 LAB — CBC
HCT: 27.5 % — ABNORMAL LOW (ref 36.0–46.0)
Hemoglobin: 9.3 g/dL — ABNORMAL LOW (ref 12.0–15.0)
MCH: 30.5 pg (ref 26.0–34.0)
MCHC: 33.8 g/dL (ref 30.0–36.0)
MCV: 90.2 fL (ref 78.0–100.0)
Platelets: 354 10*3/uL (ref 150–400)
RBC: 3.05 MIL/uL — ABNORMAL LOW (ref 3.87–5.11)
RDW: 15.9 % — AB (ref 11.5–15.5)
WBC: 9.2 10*3/uL (ref 4.0–10.5)

## 2015-05-24 LAB — WOUND CULTURE: Special Requests: NORMAL

## 2015-05-24 MED ORDER — HYDROCOD POLST-CPM POLST ER 10-8 MG/5ML PO SUER
5.0000 mL | Freq: Two times a day (BID) | ORAL | Status: DC | PRN
  Administered 2015-05-24 – 2015-06-17 (×27): 5 mL
  Filled 2015-05-24 (×27): qty 5

## 2015-05-24 NOTE — Progress Notes (Signed)
4 Days Post-Op Procedure(s) (LRB): EXPLORATION OF LEFT NECK (Left) Subjective: Feels better this AM Cough improved with Tussionex  Objective: Vital signs in last 24 hours: Temp:  [98.4 F (36.9 C)-98.8 F (37.1 C)] 98.7 F (37.1 C) (07/26 0730) Pulse Rate:  [69-88] 72 (07/26 0500) Cardiac Rhythm:  [-] Normal sinus rhythm (07/26 0600) Resp:  [11-76] 17 (07/26 0500) BP: (88-149)/(43-74) 102/57 mmHg (07/26 0500) SpO2:  [93 %-99 %] 96 % (07/26 0731) Weight:  [160 lb 4.4 oz (72.7 kg)] 160 lb 4.4 oz (72.7 kg) (07/26 0421)  Hemodynamic parameters for last 24 hours:    Intake/Output from previous day: 07/25 0701 - 07/26 0700 In: 2110 [I.V.:230; NG/GT:1490; IV Piggyback:150] Out: 450 [Urine:450] Intake/Output this shift:    General appearance: alert, cooperative and no distress Neurologic: intact Heart: regular rate and rhythm Lungs: clear to auscultation bilaterally Wound: still with purulent drainage  Lab Results:  Recent Labs  05/23/15 0450 05/24/15 0434  WBC 12.2* 9.2  HGB 9.3* 9.3*  HCT 27.3* 27.5*  PLT 329 354   BMET:  Recent Labs  05/22/15 0416 05/24/15 0434  NA 135 134*  K 3.5 4.0  CL 100* 97*  CO2 29 30  GLUCOSE 205* 120*  BUN 19 21*  CREATININE 0.89 0.98  CALCIUM 7.8* 8.4*    PT/INR: No results for input(s): LABPROT, INR in the last 72 hours. ABG    Component Value Date/Time   PHART 7.353 05/11/2015 0356   HCO3 20.8 05/11/2015 0356   TCO2 22 05/11/2015 0356   ACIDBASEDEF 4.0* 05/11/2015 0356   O2SAT 94.0 05/11/2015 0356   CBG (last 3)   Recent Labs  05/23/15 1548 05/23/15 2326 05/24/15 0341  GLUCAP 157* 207* 108*    Assessment/Plan: S/P Procedure(s) (LRB): EXPLORATION OF LEFT NECK (Left) -  Cervical anastomotic leak- still draining Continue NPO TF at goal- tolerating well On Zosyn day 3 Enoxaparin for DVT prophylaxis   LOS: 14 days    [name] 05/24/2015

## 2015-05-25 LAB — GLUCOSE, CAPILLARY
Glucose-Capillary: 139 mg/dL — ABNORMAL HIGH (ref 65–99)
Glucose-Capillary: 155 mg/dL — ABNORMAL HIGH (ref 65–99)
Glucose-Capillary: 161 mg/dL — ABNORMAL HIGH (ref 65–99)
Glucose-Capillary: 197 mg/dL — ABNORMAL HIGH (ref 65–99)
Glucose-Capillary: 201 mg/dL — ABNORMAL HIGH (ref 65–99)
Glucose-Capillary: 226 mg/dL — ABNORMAL HIGH (ref 65–99)

## 2015-05-25 LAB — BASIC METABOLIC PANEL
ANION GAP: 7 (ref 5–15)
BUN: 19 mg/dL (ref 6–20)
CHLORIDE: 97 mmol/L — AB (ref 101–111)
CO2: 29 mmol/L (ref 22–32)
CREATININE: 0.98 mg/dL (ref 0.44–1.00)
Calcium: 8.5 mg/dL — ABNORMAL LOW (ref 8.9–10.3)
GFR calc Af Amer: >60 mL/min (ref >60)
GFR calc non Af Amer: 58 mL/min — ABNORMAL LOW (ref >60)
Glucose, Bld: 184 mg/dL — ABNORMAL HIGH (ref 65–99)
POTASSIUM: 4.4 mmol/L (ref 3.5–5.1)
Sodium: 133 mmol/L — ABNORMAL LOW (ref 135–145)

## 2015-05-25 LAB — CBC
HCT: 28.5 % — ABNORMAL LOW (ref 36.0–46.0)
HEMOGLOBIN: 9.6 g/dL — AB (ref 12.0–15.0)
MCH: 30.5 pg (ref 26.0–34.0)
MCHC: 33.7 g/dL (ref 30.0–36.0)
MCV: 90.5 fL (ref 78.0–100.0)
PLATELETS: 378 10*3/uL (ref 150–400)
RBC: 3.15 MIL/uL — ABNORMAL LOW (ref 3.87–5.11)
RDW: 15.8 % — ABNORMAL HIGH (ref 11.5–15.5)
WBC: 10.1 10*3/uL (ref 4.0–10.5)

## 2015-05-25 MED ORDER — VITAL AF 1.2 CAL PO LIQD: 1000.0000 mL | ORAL | Status: DC

## 2015-05-25 MED ORDER — ALTEPLASE 2 MG IJ SOLR
2.0000 mg | Freq: Once | INTRAMUSCULAR | Status: AC
  Administered 2015-05-25: 2 mg
  Filled 2015-05-25: qty 2

## 2015-05-25 MED ORDER — LEVALBUTEROL HCL 0.63 MG/3ML IN NEBU
0.6300 mg | INHALATION_SOLUTION | Freq: Four times a day (QID) | RESPIRATORY_TRACT | Status: DC | PRN
Start: 2015-05-25 — End: 2015-06-17

## 2015-05-25 MED ORDER — SODIUM CHLORIDE 0.9 % IJ SOLN
10.0000 mL | INTRAMUSCULAR | Status: DC | PRN
  Administered 2015-05-28 – 2015-05-30 (×2): 10 mL
  Administered 2015-05-31: 20 mL
  Administered 2015-06-01 – 2015-06-02 (×2): 10 mL
  Administered 2015-06-03: 20 mL
  Filled 2015-05-25 (×6): qty 40

## 2015-05-25 NOTE — Progress Notes (Addendum)
      [address]       [place] [address]             [phone]      5 Days Post-Op Procedure(s) (LRB): EXPLORATION OF LEFT NECK (Left)   Subjective:  Sharon Price has no complaints this morning.  She denies nausea, vomiting.  She has also not moved her bowels.  Objective: Vital signs in last 24 hours: Temp:  [98.4 F (36.9 C)-99.2 F (37.3 C)] 99 F (37.2 C) (07/27 0423) Pulse Rate:  [63-78] 73 (07/27 0423) Cardiac Rhythm:  [-] Normal sinus rhythm (07/26 2030) Resp:  [14-23] 20 (07/27 0423) BP: (93-129)/(46-59) 121/58 mmHg (07/27 0423) SpO2:  [95 %-100 %] 95 % (07/27 0423) Weight:  [161 lb 9.6 oz (73.301 kg)] 161 lb 9.6 oz (73.301 kg) (07/27 0440)  Intake/Output from previous day: 07/26 0701 - 07/27 0700 In: 1150 [I.V.:110; NG/GT:700; IV Piggyback:100] Out: 975 [Urine:975]  General appearance: alert, cooperative and no distress Heart: regular rate and rhythm Lungs: clear to auscultation bilaterally Abdomen: soft, non-tender; bowel sounds normal; no masses,  no organomegaly Extremities: extremities normal, atraumatic, no cyanosis or edema Wound: neck wound shows no sign of active infection, + white drainage, no odor present  Lab Results:  Recent Labs  05/24/15 0434 05/25/15 0805  WBC 9.2 10.1  HGB 9.3* 9.6*  HCT 27.5* 28.5*  PLT 354 378   BMET:  Recent Labs  05/24/15 0434  NA 134*  K 4.0  CL 97*  CO2 30  GLUCOSE 120*  BUN 21*  CREATININE 0.98  CALCIUM 8.4*    PT/INR: No results for input(s): LABPROT, INR in the last 72 hours. ABG    Component Value Date/Time   PHART 7.353 05/11/2015 0356   HCO3 20.8 05/11/2015 0356   TCO2 22 05/11/2015 0356   ACIDBASEDEF 4.0* 05/11/2015 0356   O2SAT 94.0 05/11/2015 0356   CBG (last 3)   Recent Labs  05/24/15 2349 05/25/15 0420 05/25/15 0814  GLUCAP 226* 155* 161*    Assessment/Plan: S/P Procedure(s) (LRB): EXPLORATION OF LEFT NECK (Left)  1. Cervical Anastomotic leak- still  draining, wound is clean 2. Continue NPO 3. Tube feeds- tolerating without difficulty, continue at current goal 4. Zosyn Day 4 5. Dispo- patient stable, will need repeat swallow study at some point   LOS: 15 days    Price, Sharon 05/25/2015  Patient currently stable with a controlled esophageal leak at the cervical anastomosis Will wait for penrose drainage to stop before another swallow study and cont TF, PT Check electrolytes in am

## 2015-05-25 NOTE — Progress Notes (Signed)
Physical Therapy Treatment Patient Details Name: Sharon Price MRN: [id number] DOB: 1946-10-11 Today's Date: 05/25/2015    History of Present Illness Adm for surgical esophagectomy 7/12; return to OR 7/20 for cervical anastomosis leak; (pt s/p esophageal cancer and has completed chemotherapy/radiation. Her treatment course has been complicated with severe dehydration poor nutrition, requiring a PEG tube to be placed which became infected and had to be replaced several times. PMHx- DM, PE, CKD    PT Comments    Patient continues to be limited by fatigue and does not feel strong enough yet to attempt gait without RW. Continues to make good progress and initial goals met with updated goals set.   Follow Up Recommendations  Supervision/Assistance - 24 hour;Home health PT     Equipment Recommendations  None recommended by PT    Recommendations for Other Services OT consult     Precautions / Restrictions Precautions Precautions: Fall Precaution Comments:  Drain LUQ, abdominal incision, right IJ line Restrictions Weight Bearing Restrictions: No    Mobility  Bed Mobility Overal bed mobility: Needs Assistance Bed Mobility: Sit to Sidelying         Sit to sidelying: Min guard General bed mobility comments: vc for technique to minimize incisional strain/pain  Transfers Overall transfer level: Needs assistance Equipment used: Rolling walker (2 wheeled);None Transfers: Sit to/from Stand Sit to Stand: Min guard         General transfer comment: x 3; vc for hand placement and full upright position (pt avoiding due to pain)  Ambulation/Gait Ambulation/Gait assistance: Min guard Ambulation Distance (Feet): 150 Feet Assistive device: Rolling walker (2 wheeled) Gait Pattern/deviations: Step-through pattern;Decreased stride length;Trunk flexed Gait velocity: improved   General Gait Details: very flexed torso, not able to fully correct due to pain (and admitted kyphosis  PTA--not this much); one standing rest break after 1200 ft   Stairs            Wheelchair Mobility    Modified Rankin (Stroke Patients Only)       Balance     Sitting balance-Leahy Scale: Fair       Standing balance-Leahy Scale: Fair                 High Level Balance Comments: too fatigued after up for hours    Cognition Arousal/Alertness: Awake/alert Behavior During Therapy: WFL for tasks assessed/performed Overall Cognitive Status: Within Functional Limits for tasks assessed                      Exercises      General Comments        Pertinent Vitals/Pain Pain Assessment: Faces Faces Pain Scale: Hurts little more Pain Location: my butt Pain Descriptors / Indicators: Tender Pain Intervention(s): Repositioned    Home Living                      Prior Function            PT Goals (current goals can now be found in the care plan section) Acute Rehab PT Goals Patient Stated Goal: Return to PLOF PT Goal Formulation: With patient Time For Goal Achievement: 06/08/15 Potential to Achieve Goals: Good Progress towards PT goals: Goals met and updated - see care plan    Frequency  Min 3X/week    PT Plan Current plan remains appropriate    Co-evaluation             End of Session  Activity Tolerance: Patient limited by fatigue Patient left: with call bell/phone within reach;in bed (sidelying to relieve pressure on backside)     Time: 6893-4068 PT Time Calculation (min) (ACUTE ONLY): 22 min  Charges:  $Gait Training: 8-22 mins                    G Codes:      Justise Ehmann June 15, 2015, 10:34 AM Pager [phone]

## 2015-05-26 LAB — COMPREHENSIVE METABOLIC PANEL
ALT: 25 U/L (ref 14–54)
AST: 32 U/L (ref 15–41)
Albumin: 1.8 g/dL — ABNORMAL LOW (ref 3.5–5.0)
Alkaline Phosphatase: 166 U/L — ABNORMAL HIGH (ref 38–126)
Anion gap: 5 (ref 5–15)
BUN: 17 mg/dL (ref 6–20)
CO2: 30 mmol/L (ref 22–32)
Calcium: 8.5 mg/dL — ABNORMAL LOW (ref 8.9–10.3)
Chloride: 101 mmol/L (ref 101–111)
Creatinine, Ser: 0.86 mg/dL (ref 0.44–1.00)
GFR calc Af Amer: >60 mL/min (ref >60)
GFR calc non Af Amer: >60 mL/min (ref >60)
Glucose, Bld: 110 mg/dL — ABNORMAL HIGH (ref 65–99)
Potassium: 4.2 mmol/L (ref 3.5–5.1)
Sodium: 136 mmol/L (ref 135–145)
Total Bilirubin: 0.6 mg/dL (ref 0.3–1.2)
Total Protein: 5.3 g/dL — ABNORMAL LOW (ref 6.5–8.1)

## 2015-05-26 LAB — GLUCOSE, CAPILLARY
Glucose-Capillary: 105 mg/dL — ABNORMAL HIGH (ref 65–99)
Glucose-Capillary: 150 mg/dL — ABNORMAL HIGH (ref 65–99)
Glucose-Capillary: 167 mg/dL — ABNORMAL HIGH (ref 65–99)
Glucose-Capillary: 186 mg/dL — ABNORMAL HIGH (ref 65–99)
Glucose-Capillary: 233 mg/dL — ABNORMAL HIGH (ref 65–99)
Glucose-Capillary: 94 mg/dL (ref 65–99)

## 2015-05-26 LAB — PREALBUMIN: Prealbumin: 9.5 mg/dL — ABNORMAL LOW (ref 18–38)

## 2015-05-26 NOTE — Progress Notes (Addendum)
      [address]       [place] [address]             [phone]      6 Days Post-Op Procedure(s) (LRB): EXPLORATION OF LEFT NECK (Left)   Subjective:  Sharon Price has no complaints this morning.  She continues to deny nausea, vomiting, and abdominal pain.  Objective: Vital signs in last 24 hours: Temp:  [98.7 F (37.1 C)-98.8 F (37.1 C)] 98.7 F (37.1 C) (07/27 2010) Pulse Rate:  [69-74] 72 (07/28 0443) Cardiac Rhythm:  [-] Normal sinus rhythm (07/27 1950) Resp:  [17-18] 17 (07/28 0443) BP: (108-129)/(51-69) 118/59 mmHg (07/28 0443) SpO2:  [92 %-97 %] 92 % (07/28 0443) Weight:  [160 lb 6.4 oz (72.757 kg)] 160 lb 6.4 oz (72.757 kg) (07/28 0152)  Intake/Output from previous day: 07/27 0701 - 07/28 0700 In: 3602 [NG/GT:3151] Out: 551 [Urine:550; Stool:1]  General appearance: alert, cooperative and no distress Heart: regular rate and rhythm Lungs: clear to auscultation bilaterally Abdomen: soft, non-tender; bowel sounds normal; no masses,  no organomegaly Extremities: extremities normal, atraumatic, no cyanosis or edema Wound: left neck continues to have purulent drainage from penrose drain,  abdominal incision clean and dry, staples in place  Lab Results:  Recent Labs  05/24/15 0434 05/25/15 0805  WBC 9.2 10.1  HGB 9.3* 9.6*  HCT 27.5* 28.5*  PLT 354 378   BMET:  Recent Labs  05/25/15 0805 05/26/15 0448  NA 133* 136  K 4.4 4.2  CL 97* 101  CO2 29 30  GLUCOSE 184* 110*  BUN 19 17  CREATININE 0.98 0.86  CALCIUM 8.5* 8.5*    PT/INR: No results for input(s): LABPROT, INR in the last 72 hours. ABG    Component Value Date/Time   PHART 7.353 05/11/2015 0356   HCO3 20.8 05/11/2015 0356   TCO2 22 05/11/2015 0356   ACIDBASEDEF 4.0* 05/11/2015 0356   O2SAT 94.0 05/11/2015 0356   CBG (last 3)   Recent Labs  05/26/15 05/26/15 0441 05/26/15 0750  GLUCAP 186* 105* 150*    Assessment/Plan: S/P Procedure(s) (LRB): EXPLORATION OF  LEFT NECK (Left)  1. Cervical Anastomotic Leak- still draining, purulent material from Penrose drain in left neck 2. GI- continue NPO, tube feeds at goal 3. ID- Zosyn on Day 5 4. Dispo- patient stable, penrose continues to drain, continue ABX, BMET ok   LOS: 16 days    Price, Sharon 05/26/2015  Patient seen and examined, agree with above Drainage is decreasing Probably will plan to do another swallowing study on Monday  Sharon Price C. Roxan Hockey, MD [facility] [phone]

## 2015-05-27 LAB — GLUCOSE, CAPILLARY
Glucose-Capillary: 160 mg/dL — ABNORMAL HIGH (ref 65–99)
Glucose-Capillary: 167 mg/dL — ABNORMAL HIGH (ref 65–99)
Glucose-Capillary: 173 mg/dL — ABNORMAL HIGH (ref 65–99)
Glucose-Capillary: 200 mg/dL — ABNORMAL HIGH (ref 65–99)
Glucose-Capillary: 268 mg/dL — ABNORMAL HIGH (ref 65–99)
Glucose-Capillary: 93 mg/dL (ref 65–99)

## 2015-05-27 NOTE — Progress Notes (Addendum)
      [address]       [place] [address]             [phone]      7 Days Post-Op Procedure(s) (LRB): EXPLORATION OF LEFT NECK (Left)   Subjective:  Ms. Lepkowski continues to have no complaints this morning.  Objective: Vital signs in last 24 hours: Temp:  [98 F (36.7 C)-99.4 F (37.4 C)] 99 F (37.2 C) (07/29 0333) Pulse Rate:  [71-73] 73 (07/29 0333) Cardiac Rhythm:  [-] Normal sinus rhythm (07/28 1930) Resp:  [16-18] 18 (07/29 0333) BP: (105-139)/(47-61) 121/53 mmHg (07/29 0333) SpO2:  [94 %-97 %] 94 % (07/29 0333) Weight:  [160 lb 11.2 oz (72.893 kg)] 160 lb 11.2 oz (72.893 kg) (07/29 0323)  Intake/Output from previous day: 07/28 0701 - 07/29 0700 In: 1789 [I.V.:10; NG/GT:1629] Out: 450 [Urine:450]  General appearance: alert, cooperative and no distress Heart: regular rate and rhythm Lungs: clear to auscultation bilaterally Abdomen: soft, non-tender; bowel sounds normal; no masses,  no organomegaly Extremities: extremities normal, atraumatic, no cyanosis or edema Wound: + drainage from penrose drain, abdominal wound is clean and dry  Lab Results:  Recent Labs  05/25/15 0805  WBC 10.1  HGB 9.6*  HCT 28.5*  PLT 378   BMET:  Recent Labs  05/25/15 0805 05/26/15 0448  NA 133* 136  K 4.4 4.2  CL 97* 101  CO2 29 30  GLUCOSE 184* 110*  BUN 19 17  CREATININE 0.98 0.86  CALCIUM 8.5* 8.5*    PT/INR: No results for input(s): LABPROT, INR in the last 72 hours. ABG    Component Value Date/Time   PHART 7.353 05/11/2015 0356   HCO3 20.8 05/11/2015 0356   TCO2 22 05/11/2015 0356   ACIDBASEDEF 4.0* 05/11/2015 0356   O2SAT 94.0 05/11/2015 0356   CBG (last 3)   Recent Labs  05/26/15 2015 05/27/15 0015 05/27/15 0334  GLUCAP 167* 200* 167*    Assessment/Plan: S/P Procedure(s) (LRB): EXPLORATION OF LEFT NECK (Left)  1. Cervical Anastomotic Leak- still draining from penrose drain, continue dressing changes 2.  GI- continue  NPO, tube feeds at goal patient tolerating without difficulty 3. ID- Zosyn Day 5 4. Dispo- patient stable, penrose continues to drain, continue ABX,   LOS: 17 days    BARRETT, ERIN 05/27/2015  Patient seen and examined, agree with above Still with significant drainage but decreasing Wound OK Will check repeat swallow Monday  Remo Lipps C. Roxan Hockey, MD [facility] [phone]

## 2015-05-27 NOTE — Progress Notes (Signed)
05/27/2015 12:30 AM Pt needed a new bottle of tube feeding.  A message was sent to pharmacy around 2015 PM to request a new bottle.  At around 21:00 PM I called pharmacy to see if the feeding material was ready.  The pharmacy attendant who answered informed me they did not have the feeding and I had to get it through materials management.  For over an hour and a half I sent two forms to material management for the tube feeding bottle and called them several times.  The bottle of tube feeding the patient was already hooked up to was almost empty so at around 22:50 PM I stopped the Kangaroo pump to keep the tubing from emptying.  A few minutes after I stopped the pump, the charge nurse, Darryl, informed me that pharmacy called to say they had the tube feeding and needed me to come and get it.  I was able to hang up a new bottle of the feeding and restart the pump at 23:15 PM.  Will continue to monitor the patient. Lupita Dawn, RN

## 2015-05-27 NOTE — Progress Notes (Signed)
Physical Therapy Treatment Patient Details Name: Sharon Price MRN: [id number] DOB: 1946-06-20 Today's Date: 05/27/2015    History of Present Illness Adm for surgical esophagectomy 7/12; return to OR 7/20 for cervical anastomosis leak; (pt s/p esophageal cancer and has completed chemotherapy/radiation. Her treatment course has been complicated with severe dehydration poor nutrition, requiring a PEG tube to be placed which became infected and had to be replaced several times. PMHx- DM, PE, CKD    PT Comments    Pt progressing with rehab and is very compliant with therapy. Trunk remaining flexed during ambulation suspect due to pain. Continue with current POC.  Follow Up Recommendations  Supervision/Assistance - 24 hour;Home health PT     Equipment Recommendations  None recommended by PT    Recommendations for Other Services       Precautions / Restrictions Precautions Precautions: Fall Precaution Comments:  Drain LUQ, abdominal incision, right IJ line Restrictions Weight Bearing Restrictions: No    Mobility  Bed Mobility               General bed mobility comments: Pt found in chair and returned to chair after session.  Transfers Overall transfer level: Needs assistance Equipment used: Rolling walker (2 wheeled) Transfers: Sit to/from Stand Sit to Stand: Min guard         General transfer comment: VC for hand placement. Min G for pain.  Ambulation/Gait Ambulation/Gait assistance: Min guard Ambulation Distance (Feet): 150 Feet Assistive device: Rolling walker (2 wheeled) Gait Pattern/deviations: Step-through pattern;Decreased stride length;Trunk flexed   Gait velocity interpretation: Below normal speed for age/gender General Gait Details: Very flexed torso, not able to fully correct due to pain. Slow but overall was steady.   Stairs            Wheelchair Mobility    Modified Rankin (Stroke Patients Only)       Balance                                    Cognition Arousal/Alertness: Awake/alert Behavior During Therapy: WFL for tasks assessed/performed Overall Cognitive Status: Within Functional Limits for tasks assessed                      Exercises      General Comments        Pertinent Vitals/Pain Faces Pain Scale: Hurts a little bit Pain Location: General abdominal pain. Pain Descriptors / Indicators: Sore Pain Intervention(s): Limited activity within patient's tolerance;Monitored during session;Repositioned    Home Living                      Prior Function            PT Goals (current goals can now be found in the care plan section) Progress towards PT goals: Progressing toward goals    Frequency  Min 3X/week    PT Plan Current plan remains appropriate    Co-evaluation             End of Session Equipment Utilized During Treatment: Gait belt Activity Tolerance: Patient tolerated treatment well Patient left: in chair;with call bell/phone within reach     Time: 1350-1401 PT Time Calculation (min) (ACUTE ONLY): 11 min  Charges:                       G Codes:  Allyn Kenner, SPTA 05/27/2015, 2:15 PM

## 2015-05-27 NOTE — Care Management Note (Signed)
Case Management Note CM note started by Luz Lex RNCM  Patient Details  Name: Sharon Price MRN: [id number] Date of Birth: Jan 06, 1946  Subjective/Objective:       Prior to admission lives at home with son.  Son does not work and states is able and will be with her 24/7 on discharge.               Action/Plan:   Expected Discharge Date:                  Expected Discharge Plan:  [place]  In-House Referral:     Discharge planning Services  CM Consult  Post Acute Care Choice:    Choice offered to:     DME Arranged:    DME Agency:     HH Arranged:    [place] Agency:     Status of Service:  In process, will continue to follow  Medicare Important Message Given:    Date Medicare IM Given:    Medicare IM give by:    Date Additional Medicare IM Given:    Additional Medicare Important Message give by:     If discussed at [place] of Stay Meetings, dates discussed:  05/26/15  Additional Comments: 05/27/15 Elenor Quinones, RN, BSN [phone] CM provided sticky physician note; informing HH recommendations are made, requesting [place] orders and CM consults prior to discharge to arrange recommendations.  05/17/15- pt with NGT out-, remains NPO on TF- has CT in, continues on IV amio, IV lopressor, IV lasix, NCM to continue to follow for d/c needs and planning  Maryclare Labrador, RN 05/27/2015, 4:23 PM

## 2015-05-27 NOTE — Progress Notes (Addendum)
Nutrition Follow Up  DOCUMENTATION CODES:   Not applicable  INTERVENTION:    Continue Vital AF 1.2 formula at goal rate of 70 ml/hr   NUTRITION DIAGNOSIS:   Inadequate oral intake related to inability to eat (s/p esophageal resection) as evidenced by NPO status, ongoing  GOAL:   Patient will meet greater than or equal to 90% of their needs, met MONITOR:   TF tolerance, Weight trends, Labs, I & O's  ASSESSMENT:  69 y.o. 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 The patient has completed neoadjuvant chemotherapy radiation with weekly carboplatin and and paclltaxel in AUG 2015. Her treatment course has been complicated with severe dehydration poor nutrition, requiring a PEG tube to be placed which becameinfected and had to be replaced several times. The patient needed supplementary IV fluids through a PICC line.   Patient s/p procedures 7/12: VIDEO BRONCHOSCOPY  TRANSHIATAL TOTAL ESOPHAGEAL RESECTION FEEDING JEJUNOSTOMY   Patient s/p procedure 7/22: EXPLORATION OF LEFT NECK   Pt continues to be NPO.  No N/V.  Vital AF 1.2 formula currently infusing at goal rate of 70 ml/hr providing 2016 kcals, 126 gm protein, 1361 ml of free water.  Tolerating well.    Diet Order:  Diet NPO time specified  Skin:  Wound (see comment) (Incision on abdomen, L neck, and wound on L knee)  Last BM:  7/23  Height:   Ht Readings from Last 1 Encounters:  05/24/15 _0  (1.549 m)    Weight:   Wt Readings from Last 1 Encounters:  05/27/15 160 lb 11.2 oz (72.893 kg)    Ideal Body Weight:  50 kg  Wt Readings from Last 10 Encounters:  05/27/15 160 lb 11.2 oz (72.893 kg)  05/06/15 157 lb 11.2 oz (71.532 kg)  04/28/15 156 lb (70.761 kg)  04/04/15 156 lb (70.761 kg)  03/31/15 151 lb (68.493 kg)  03/30/15 151 lb (68.493 kg)  03/15/15 151 lb (68.493 kg)  11/30/14 157 lb 3 oz (71.3 kg)   09/16/14 151 lb (68.493 kg)  08/10/14 136 lb (61.689 kg)    BMI:  Body mass index is 30.38 kg/(m^2).  Estimated Nutritional Needs:   Kcal:  2000-2200  Protein:  110-120 gm  Fluid:  2.0-2.2 L  EDUCATION NEEDS:   No education needs identified at this time  Arthur Holms, RD, LDN Pager #: [phone] After-Hours Pager #: [phone]

## 2015-05-28 LAB — GLUCOSE, CAPILLARY
Glucose-Capillary: 137 mg/dL — ABNORMAL HIGH (ref 65–99)
Glucose-Capillary: 152 mg/dL — ABNORMAL HIGH (ref 65–99)
Glucose-Capillary: 179 mg/dL — ABNORMAL HIGH (ref 65–99)
Glucose-Capillary: 182 mg/dL — ABNORMAL HIGH (ref 65–99)
Glucose-Capillary: 202 mg/dL — ABNORMAL HIGH (ref 65–99)
Glucose-Capillary: 210 mg/dL — ABNORMAL HIGH (ref 65–99)
Glucose-Capillary: 260 mg/dL — ABNORMAL HIGH (ref 65–99)

## 2015-05-28 MED ORDER — INSULIN DETEMIR 100 UNIT/ML ~~LOC~~ SOLN
18.0000 [IU] | Freq: Two times a day (BID) | SUBCUTANEOUS | Status: DC
  Administered 2015-05-28 (×2): 18 [IU] via SUBCUTANEOUS
  Filled 2015-05-28 (×3): qty 0.18

## 2015-05-28 NOTE — Progress Notes (Addendum)
      [address]       [place] [address]             [phone]       8 Days Post-Op Procedure(s) (LRB): EXPLORATION OF LEFT NECK (Left)  Subjective: Patient without complaints.  Objective: Vital signs in last 24 hours: Temp:  [98.4 F (36.9 C)-99.3 F (37.4 C)] 98.9 F (37.2 C) (07/30 0429) Pulse Rate:  [74-82] 82 (07/30 0429) Cardiac Rhythm:  [-] Normal sinus rhythm (07/30 0743) Resp:  [18-19] 18 (07/30 0429) BP: (115-145)/(49-61) 145/57 mmHg (07/30 0429) SpO2:  [94 %-97 %] 95 % (07/30 0429) Weight:  [160 lb (72.576 kg)] 160 lb (72.576 kg) (07/30 0429)      Intake/Output from previous day: 07/29 0701 - 07/30 0700 In: 1422 [I.V.:10] Out: 250 [Urine:250]   Physical Exam:  Cardiovascular: RRR Pulmonary: Clear to auscultation bilaterally; no rales, wheezes, or rhonchi. Abdomen: Soft, non tender, bowel sounds present. Extremities: Trace  bilateral lower extremity edema. Wounds: Purulent like drainage from penrose drains in neck   Lab Results: CBC:No results for input(s): WBC, HGB, HCT, PLT in the last 72 hours. BMET:  Recent Labs  05/26/15 0448  NA 136  K 4.2  CL 101  CO2 30  GLUCOSE 110*  BUN 17  CREATININE 0.86  CALCIUM 8.5*    PT/INR: No results for input(s): LABPROT, INR in the last 72 hours. ABG:  INR: Will add last result for INR, ABG once components are confirmed Will add last 4 CBG results once components are confirmed  Assessment/Plan:  1. Cervical anastomotic leak-continues to drain from Penrose 2.GI-remains NPO, tolerating TFs. Will re check swallow study on Monday. 3.ID-on Zosyn (day #6) 4.DM-CBGs 160/202/137. On Insulin only and increased for better glucose control.  ZIMMERMAN,DONIELLE MPA-C 05/28/2015,8:14 AM Patient seen and examined, agree with above Drainage is still purulent but is tapering down Plan swallow Monday  Remo Lipps C. Roxan Hockey, MD [facility] [phone]

## 2015-05-28 NOTE — Progress Notes (Signed)
Pt assisted to ambulate in hall 150 feet with rolling walker at steady pace. Pt tolerated well. Assisted to chair. Call bell in reach.

## 2015-05-28 NOTE — Progress Notes (Signed)
Assisted pt to ambulate in hall with rolling walker, 150 feet. She tolerated well.

## 2015-05-29 LAB — CBC
HCT: 28.1 % — ABNORMAL LOW (ref 36.0–46.0)
Hemoglobin: 9.2 g/dL — ABNORMAL LOW (ref 12.0–15.0)
MCH: 29.9 pg (ref 26.0–34.0)
MCHC: 32.7 g/dL (ref 30.0–36.0)
MCV: 91.2 fL (ref 78.0–100.0)
Platelets: 427 10*3/uL — ABNORMAL HIGH (ref 150–400)
RBC: 3.08 MIL/uL — ABNORMAL LOW (ref 3.87–5.11)
RDW: 15.3 % (ref 11.5–15.5)
WBC: 10 10*3/uL (ref 4.0–10.5)

## 2015-05-29 LAB — BASIC METABOLIC PANEL
Anion gap: 7 (ref 5–15)
BUN: 23 mg/dL — ABNORMAL HIGH (ref 6–20)
CALCIUM: 8.8 mg/dL — AB (ref 8.9–10.3)
CO2: 26 mmol/L (ref 22–32)
Chloride: 103 mmol/L (ref 101–111)
Creatinine, Ser: 0.92 mg/dL (ref 0.44–1.00)
GFR calc Af Amer: >60 mL/min (ref >60)
GLUCOSE: 162 mg/dL — AB (ref 65–99)
POTASSIUM: 4.2 mmol/L (ref 3.5–5.1)
SODIUM: 136 mmol/L (ref 135–145)

## 2015-05-29 LAB — GLUCOSE, CAPILLARY
Glucose-Capillary: 158 mg/dL — ABNORMAL HIGH (ref 65–99)
Glucose-Capillary: 208 mg/dL — ABNORMAL HIGH (ref 65–99)
Glucose-Capillary: 217 mg/dL — ABNORMAL HIGH (ref 65–99)
Glucose-Capillary: 125 mg/dL — ABNORMAL HIGH (ref 65–99)
Glucose-Capillary: 158 mg/dL — ABNORMAL HIGH (ref 65–99)

## 2015-05-29 MED ORDER — LIP MEDEX EX OINT
TOPICAL_OINTMENT | CUTANEOUS | Status: DC | PRN
  Filled 2015-05-29: qty 7

## 2015-05-29 MED ORDER — BLISTEX MEDICATED EX OINT
TOPICAL_OINTMENT | CUTANEOUS | Status: DC | PRN
  Filled 2015-05-29: qty 10

## 2015-05-29 MED ORDER — INSULIN DETEMIR 100 UNIT/ML ~~LOC~~ SOLN
22.0000 [IU] | Freq: Two times a day (BID) | SUBCUTANEOUS | Status: DC
  Administered 2015-05-29 – 2015-06-05 (×15): 22 [IU] via SUBCUTANEOUS
  Filled 2015-05-29 (×16): qty 0.22

## 2015-05-29 NOTE — Progress Notes (Signed)
Pt ambulated 150 feet in hall with rolling walker and standby assist. She tolerated well. Assisted to bed. C/O right neck/shoulder pain. Will medicate.

## 2015-05-29 NOTE — Progress Notes (Addendum)
      [address]       ,[place] [address]             [phone]       9 Days Post-Op Procedure(s) (LRB): EXPLORATION OF LEFT NECK (Left)  Subjective: Patient with some discomfort right lower abdomen.   Objective: Vital signs in last 24 hours: Temp:  [98.7 F (37.1 C)-99 F (37.2 C)] 98.9 F (37.2 C) (07/31 0342) Pulse Rate:  [71-80] 73 (07/31 0342) Cardiac Rhythm:  [-] Normal sinus rhythm (07/30 2030) Resp:  [17-18] 18 (07/31 0342) BP: (130-157)/(51-68) 134/51 mmHg (07/31 0342) SpO2:  [96 %-98 %] 96 % (07/31 0342) Weight:  [158 lb 15.2 oz (72.1 kg)] 158 lb 15.2 oz (72.1 kg) (07/31 0342)     Intake/Output from previous day: 07/30 0701 - 07/31 0700 In: 940 [IV Piggyback:100] Out: 1050 [Urine:1050]   Physical Exam:  Cardiovascular: RRR Pulmonary: Clear to auscultation bilaterally; no rales, wheezes, or rhonchi. Abdomen: Soft, non tender, bowel sounds present. Extremities: Trace bilateral lower extremity edema. Wounds: Purulent like drainage from penrose drains in neck   Lab Results: CBC:  Recent Labs  05/29/15 0417  WBC 10.0  HGB 9.2*  HCT 28.1*  PLT 427*   BMET:   Recent Labs  05/29/15 0417  NA 136  K 4.2  CL 103  CO2 26  GLUCOSE 162*  BUN 23*  CREATININE 0.92  CALCIUM 8.8*    PT/INR: No results for input(s): LABPROT, INR in the last 72 hours. ABG:  INR: Will add last result for INR, ABG once components are confirmed Will add last 4 CBG results once components are confirmed  Assessment/Plan:  1. Cervical anastomotic leak-continues to drain from Penrose 2.GI-remains NPO, tolerating TFs. Will re check swallow study on Monday. 3.ID-on Zosyn (day #7) and Diflucan 4.DM-CBGs 182/210/158. On Insulin only and increased for better glucose control. 5. Regarding right lower abdominal pain, she appears to be sore where Insulin injections are being given. No N/V.  ZIMMERMAN,DONIELLE MPA-C 05/29/2015,7:50 AM

## 2015-05-29 NOTE — Progress Notes (Signed)
Pt ambulated in hall with rolling walker and standby assist 200 feet. She stopped 3 times to recover breathing. Assisted to chair. Few crackles noted in bases. IS and flutter valved used and encouraged use q 1hr. Pt with moist cough but non-productive. Encouraged to expectorate when possible. Also encouraged ambulation at least tid and OOB as much as possible. She verb understanding.

## 2015-05-29 NOTE — Progress Notes (Signed)
Neck dressing changed. Incision now has blood tinged mucus. Left neck drained with blood tinged mucus and 1cm clot noted on dressing. Pt had mild nose bleed late this morning but it stopped spontaneously. [name] 3:22 PM

## 2015-05-30 ENCOUNTER — Inpatient Hospital Stay (HOSPITAL_COMMUNITY): Payer: Commercial Managed Care - PPO

## 2015-05-30 DIAGNOSIS — K9433 Esophagostomy malfunction: Secondary | ICD-10-CM

## 2015-05-30 DIAGNOSIS — L8990 Pressure ulcer of unspecified site, unspecified stage: Secondary | ICD-10-CM | POA: Insufficient documentation

## 2015-05-30 DIAGNOSIS — K9189 Other postprocedural complications and disorders of digestive system: Secondary | ICD-10-CM | POA: Insufficient documentation

## 2015-05-30 LAB — GLUCOSE, CAPILLARY
Glucose-Capillary: 110 mg/dL — ABNORMAL HIGH (ref 65–99)
Glucose-Capillary: 166 mg/dL — ABNORMAL HIGH (ref 65–99)
Glucose-Capillary: 204 mg/dL — ABNORMAL HIGH (ref 65–99)
Glucose-Capillary: 213 mg/dL — ABNORMAL HIGH (ref 65–99)
Glucose-Capillary: 239 mg/dL — ABNORMAL HIGH (ref 65–99)
Glucose-Capillary: 247 mg/dL — ABNORMAL HIGH (ref 65–99)

## 2015-05-30 MED ORDER — IOHEXOL 300 MG/ML  SOLN
150.0000 mL | Freq: Once | INTRAMUSCULAR | Status: AC | PRN
  Administered 2015-05-30: 150 mL via ORAL

## 2015-05-30 NOTE — Progress Notes (Signed)
Physical Therapy Treatment Patient Details Name: DASHANAE LONGFIELD MRN: [id number] DOB: 11-08-1945 Today's Date: 05/30/2015    History of Present Illness Adm for surgical esophagectomy 7/12; return to OR 7/20 for cervical anastomosis leak; (pt s/p esophageal cancer and has completed chemotherapy/radiation. Her treatment course has been complicated with severe dehydration poor nutrition, requiring a PEG tube to be placed which became infected and had to be replaced several times. PMHx- DM, PE, CKD    PT Comments    Pt progressing towards physical therapy goals. Was able to perform transfers and ambulation with min guard assist and VC's for hand placement/general safety awareness. Pt reports she has been ambulating more often with nursing, however tolerance for functional activity remains decreased during therapy sessions. Will continue to follow and progress as able per POC.   Follow Up Recommendations  Supervision/Assistance - 24 hour;Home health PT     Equipment Recommendations  None recommended by PT    Recommendations for Other Services       Precautions / Restrictions Precautions Precautions: Fall Precaution Comments:  Drain LUQ, abdominal incision, right IJ line Restrictions Weight Bearing Restrictions: No    Mobility  Bed Mobility               General bed mobility comments: Pt sitting on EOB with RN present when PT arrived.   Transfers Overall transfer level: Needs assistance Equipment used: Rolling walker (2 wheeled) Transfers: Sit to/from Stand Sit to Stand: Min guard         General transfer comment: Pt cued for proper hand placement on seated surface for safety.   Ambulation/Gait Ambulation/Gait assistance: Min guard Ambulation Distance (Feet): 150 Feet Assistive device: Rolling walker (2 wheeled) Gait Pattern/deviations: Step-through pattern;Decreased stride length;Trunk flexed Gait velocity: Decreased Gait velocity interpretation: Below normal  speed for age/gender General Gait Details: Grossly flexed posture. Attempts improving upright position with cueing however is not able to maintain. Pt reports she was ambulating in halls earlier today and did not feel she could ambulate farther than the circle around the nurse's station.    Stairs            Wheelchair Mobility    Modified Rankin (Stroke Patients Only)       Balance Overall balance assessment: Needs assistance Sitting-balance support: Feet supported;No upper extremity supported Sitting balance-Leahy Scale: Fair     Standing balance support: Bilateral upper extremity supported Standing balance-Leahy Scale: Fair Standing balance comment: Stood without UE support while RN fastened adult diaper.                     Cognition Arousal/Alertness: Awake/alert Behavior During Therapy: WFL for tasks assessed/performed Overall Cognitive Status: Within Functional Limits for tasks assessed                      Exercises General Exercises - Lower Extremity Quad Sets: 10 reps Long Arc Quad: 10 reps    General Comments        Pertinent Vitals/Pain Pain Assessment: Faces Faces Pain Scale: Hurts little more Pain Location: Abdomen Pain Descriptors / Indicators: Operative site guarding;Grimacing Pain Intervention(s): Monitored during session;Limited activity within patient's tolerance;Repositioned    Home Living                      Prior Function            PT Goals (current goals can now be found in the care plan section) Acute Rehab  PT Goals Patient Stated Goal: Return to PLOF PT Goal Formulation: With patient Time For Goal Achievement: 06/08/15 Potential to Achieve Goals: Good Progress towards PT goals: Progressing toward goals    Frequency  Min 3X/week    PT Plan Current plan remains appropriate    Co-evaluation             End of Session Equipment Utilized During Treatment: Gait belt Activity Tolerance: Patient  limited by fatigue Patient left: in chair;with call bell/phone within reach     Time: 0957-1016 PT Time Calculation (min) (ACUTE ONLY): 19 min  Charges:  $Gait Training: 8-22 mins                    G Codes:      Rolinda Roan 2015/06/04, 2:31 PM  Rolinda Roan, PT, DPT Acute Rehabilitation Services Pager: [phone]

## 2015-05-30 NOTE — Progress Notes (Addendum)
[address]       [place] [address]             [phone]      10 Days Post-Op Procedure(s) (LRB): EXPLORATION OF LEFT NECK (Left)   Subjective:  Had nose bleed early this morning.  No new complaints  Objective: Vital signs in last 24 hours: Temp:  [98 F (36.7 C)-98.8 F (37.1 C)] 98 F (36.7 C) (08/01 0413) Pulse Rate:  [73] 73 (08/01 0413) Cardiac Rhythm:  [-] Normal sinus rhythm (07/31 1930) Resp:  [18-19] 18 (08/01 0413) BP: (130-135)/(60-62) 135/62 mmHg (08/01 1000) SpO2:  [95 %] 95 % (08/01 0413) Weight:  [159 lb 4.8 oz (72.258 kg)] 159 lb 4.8 oz (72.258 kg) (08/01 0413)  Intake/Output from previous day: 07/31 0701 - 08/01 0700 In: 10 [I.V.:10] Out: 900 [Urine:900]  General appearance: alert, cooperative and no distress Heart: regular rate and rhythm Lungs: clear to auscultation bilaterally Abdomen: soft, non-tender; bowel sounds normal; no masses,  no organomegaly Wound: blood tinged mucous from penrose and neck incision  Lab Results:  Recent Labs  05/29/15 0417  WBC 10.0  HGB 9.2*  HCT 28.1*  PLT 427*   BMET:  Recent Labs  05/29/15 0417  NA 136  K 4.2  CL 103  CO2 26  GLUCOSE 162*  BUN 23*  CREATININE 0.92  CALCIUM 8.8*    PT/INR: No results for input(s): LABPROT, INR in the last 72 hours. 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 Prior study 05/18/2015 demonstrated possible proximal anastomotic leak. For re-evaluation.  EXAM: ESOPHOGRAM/BARIUM SWALLOW  TECHNIQUE: Single contrast examination was performed  using water-soluble contrast.  FLUOROSCOPY TIME:  Fluoroscopy Time:  54 seconds  COMPARISON:  Chest CT 05/24/2015; swallow examination 05/18/2015  FINDINGS: Imaging was performed in the standing position. The patient swallowed the contrast material without difficulty. Contrast coursed into the proximal esophagus and promptly extravasated from the left lateral aspect of the proximal esophagus near the expected location of the proximal anastomosis. Contrast was demonstrated coursing along the superior and medial aspect of the left hemi thorax and likely into the mediastinum. The remaining portion of the oral contrast material coursed distally throughout the intra thoracic stomach.  IMPRESSION: Findings compatible with esophageal leak likely at level of the proximal anastomosis. Oral contrast material is demonstrated coursing into the superior and medial aspect of the left hemi thorax as well as suggestion of contrast material coursing into the mediastinum.  These results will be called to the ordering clinician or representative by the Radiologist Assistant, and communication documented in the PACS or zVision Dashboard.   Electronically Signed   By: [name] M.D.   On: 05/30/2015 09:01      Assessment/Plan: S/P Procedure(s) (LRB): EXPLORATION OF LEFT NECK (Left)  1. Cervical Anastomotic Leak- Repeat swallow study done this morning, again shows a leak- continueand dressing changes 2. Tube feeds at goal, continue, no nausea or vomiting, remain NPO 3. ID- Zosyn Day 8, also on Diflucan 4. DM-  cbgs elevated, insulin adjusted yesterday, remains elevated in mid 200s, will monitor and likely increase insulin tomorrow if remains elevated 5. Dispo- continued leak per swallow study, remain NPO, continue tube feeds, will remove every other staple from abdominal incision today.   LOS: 20 days    Sharon Price, Sharon Price 05/30/2015  Little drainage from the penrose , draining to open incision.  penrose removed  Continue  tube feeding and npo for now  I have seen and examined Sharon Price and agree with the above assessment  and plan.  Grace Isaac MD [facility] [phone] Office [phone] 05/30/2015 5:33 PM

## 2015-05-31 LAB — GLUCOSE, CAPILLARY
Glucose-Capillary: 117 mg/dL — ABNORMAL HIGH (ref 65–99)
Glucose-Capillary: 138 mg/dL — ABNORMAL HIGH (ref 65–99)
Glucose-Capillary: 140 mg/dL — ABNORMAL HIGH (ref 65–99)
Glucose-Capillary: 156 mg/dL — ABNORMAL HIGH (ref 65–99)
Glucose-Capillary: 227 mg/dL — ABNORMAL HIGH (ref 65–99)
Glucose-Capillary: 239 mg/dL — ABNORMAL HIGH (ref 65–99)

## 2015-05-31 NOTE — Op Note (Signed)
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  SURGICAL PROCEDURE:  Opening of left neck incision and drainage under MAC anesthesia.  SURGEON:  Lanelle Bal, MD  BRIEF HISTORY:  The patient is a 69 year old female who, 9 days previously, had undergone transhiatal esophagectomy.  At that time, Penrose drain was left in the left neck with very minimal drainage.  The patient, at 8 days postop, underwent a Gastrografin swallow that suggested, but was not conclusive for esophageal leak.  At that time, very little was draining from her Penrose drain.  However, the following day, the drainage increased.  The patient was afebrile and without other complicating factors.  We then recommended to her that we open the cervical incision for full drainage of the area.  Continued her on n.p.o. with tube feedings.  DESCRIPTION OF PROCEDURE:  The patient was brought to the operating room in a fasting condition.  She was under MAC anesthesia.  The left neck was prepped.  Lidocaine 1% was infiltrated along the old incision.  The previous sutures were removed.  An incision was reopened and drained. The previously placed Penrose was left in place.  We could not visualize the entire fundus of the stomach, but did identify an area of leakage along the anterior wall of the anastomosis.  The wound was then packed open loosely with saline gauze.  The patient was then transferred to the recovery  room for postoperative care, having tolerated the procedure without obvious complication.  Blood loss was minimal.     Lanelle Bal, MD     EG/MEDQ  D:  05/31/2015  T:  05/31/2015  Job:  [id number]

## 2015-05-31 NOTE — Progress Notes (Signed)
UR Completed. Kamika Goodloe, RN, BSN.  [phone] 

## 2015-05-31 NOTE — Progress Notes (Addendum)
      [address]       [place] [address]             [phone]       11 Days Post-Op Procedure(s) (LRB): EXPLORATION OF LEFT NECK (Left)  Subjective: Had "explosive bowel movement" last evening. No complaints this am.  Objective: Vital signs in last 24 hours: Temp:  [98.4 F (36.9 C)-98.8 F (37.1 C)] 98.7 F (37.1 C) (08/02 0421) Pulse Rate:  [68-82] 68 (08/02 0421) Cardiac Rhythm:  [-] Normal sinus rhythm (08/02 0210) Resp:  [17-19] 19 (08/02 0421) BP: (115-146)/(50-62) 117/57 mmHg (08/02 0421) SpO2:  [93 %-95 %] 94 % (08/02 0421) Weight:  [159 lb (72.122 kg)] 159 lb (72.122 kg) (08/02 0500)     Intake/Output from previous day: 08/01 0701 - 08/02 0700 In: 5195.2 [I.V.:5195.2] Out: 950 [Urine:950]   Physical Exam:  Cardiovascular: RRR Pulmonary: Clear to auscultation bilaterally; no rales, wheezes, or rhonchi. Abdomen: Soft, non tender, bowel sounds present. Extremities: No  lower extremity edema. Wounds: Still with light yellowish-like drainage from neck wounds. Penroses have been removed.   Lab Results: CBC:  Recent Labs  05/29/15 0417  WBC 10.0  HGB 9.2*  HCT 28.1*  PLT 427*   BMET:   Recent Labs  05/29/15 0417  NA 136  K 4.2  CL 103  CO2 26  GLUCOSE 162*  BUN 23*  CREATININE 0.92  CALCIUM 8.8*    PT/INR: No results for input(s): LABPROT, INR in the last 72 hours. ABG:  INR: Will add last result for INR, ABG once components are confirmed Will add last 4 CBG results once components are confirmed  Assessment/Plan:  1. Cervical anastomotic leak-continues to drain from Penrose 2.GI- Swallow study done yesterday showed an esophageal leak likely at level of the proximal anastomosis. To remain NPO, tolerating TFs. 3.ID-on Zosyn (day #9) and Diflucan 4.DM-CBGs 204/140/156. On Insulin only.   ZIMMERMAN,DONIELLE MPA-C 05/31/2015,8:05 AM  Seen yesterday  I have seen and examined Sharon Price and agree with the  above assessment  and plan.  Grace Isaac MD Beeper [phone] Office [phone] 06/01/2015 11:09 AM

## 2015-06-01 LAB — GLUCOSE, CAPILLARY
Glucose-Capillary: 140 mg/dL — ABNORMAL HIGH (ref 65–99)
Glucose-Capillary: 140 mg/dL — ABNORMAL HIGH (ref 65–99)
Glucose-Capillary: 154 mg/dL — ABNORMAL HIGH (ref 65–99)
Glucose-Capillary: 156 mg/dL — ABNORMAL HIGH (ref 65–99)
Glucose-Capillary: 194 mg/dL — ABNORMAL HIGH (ref 65–99)
Glucose-Capillary: 211 mg/dL — ABNORMAL HIGH (ref 65–99)

## 2015-06-01 NOTE — Progress Notes (Signed)
Physical Therapy Treatment Patient Details Name: Sharon Price MRN: [id number] DOB: [birth year] Today's Date: 06/01/2015    History of Present Illness Adm for surgical esophagectomy 7/12; return to OR 7/20 for cervical anastomosis leak; (pt s/p esophageal cancer and has completed chemotherapy/radiation. Her treatment course has been complicated with severe dehydration poor nutrition, requiring a PEG tube to be placed which became infected and had to be replaced several times. PMHx- DM, PE, CKD    PT Comments    Pt progressing towards physical therapy goals. Was able to perform transfers and ambulation with min guard assist for safety. Chair follow was utilized for 3 seated rest breaks, and pt was overall able to improve distance to 350'. Will continue to follow and progress as able per POC.   Follow Up Recommendations  Supervision/Assistance - 24 hour;Home health PT     Equipment Recommendations  None recommended by PT    Recommendations for Other Services       Precautions / Restrictions Precautions Precautions: Fall Precaution Comments:  Drain LUQ, abdominal incision, right IJ line Restrictions Weight Bearing Restrictions: No    Mobility  Bed Mobility               General bed mobility comments: Pt sitting up in recliner upon PT arrival.  Transfers Overall transfer level: Needs assistance Equipment used: Rolling walker (2 wheeled) Transfers: Sit to/from Stand Sit to Stand: Min guard         General transfer comment: Pt demonstrated proper hand placement and safety awareness during sit<>stand. Hands-on assist for safety.   Ambulation/Gait Ambulation/Gait assistance: Min guard;+2 safety/equipment Ambulation Distance (Feet): 350 Feet Assistive device: Rolling walker (2 wheeled) Gait Pattern/deviations: Step-through pattern;Decreased stride length;Trunk flexed Gait velocity: Decreased Gait velocity interpretation: Below normal speed for age/gender General  Gait Details: Grossly flexed posture. Attempts improving upright position with cueing however is not able to maintain. 3 seated rest breaks required throughout gait training.    Stairs            Wheelchair Mobility    Modified Rankin (Stroke Patients Only)       Balance Overall balance assessment: Needs assistance Sitting-balance support: Feet supported;No upper extremity supported Sitting balance-Leahy Scale: Fair     Standing balance support: Bilateral upper extremity supported;During functional activity Standing balance-Leahy Scale: Fair                      Cognition Arousal/Alertness: Awake/alert Behavior During Therapy: WFL for tasks assessed/performed Overall Cognitive Status: Within Functional Limits for tasks assessed                      Exercises General Exercises - Lower Extremity Quad Sets: 10 reps Long Arc Quad: 10 reps Hip ABduction/ADduction: 10 reps    General Comments        Pertinent Vitals/Pain Pain Assessment: Faces Faces Pain Scale: Hurts little more Pain Location: Abdomen Pain Descriptors / Indicators: Operative site guarding Pain Intervention(s): Limited activity within patient's tolerance;Monitored during session;Repositioned    Home Living                      Prior Function            PT Goals (current goals can now be found in the care plan section) Acute Rehab PT Goals Patient Stated Goal: Return to PLOF PT Goal Formulation: With patient Time For Goal Achievement: 06/08/15 Potential to Achieve Goals: Good Progress towards PT  goals: Progressing toward goals    Frequency  Min 3X/week    PT Plan Current plan remains appropriate    Co-evaluation             End of Session Equipment Utilized During Treatment: Gait belt Activity Tolerance: Patient limited by fatigue Patient left: in chair;with call bell/phone within reach     Time: 0949-1013 PT Time Calculation (min) (ACUTE ONLY): 24  min  Charges:  $Gait Training: 8-22 mins $Therapeutic Activity: 8-22 mins                    G Codes:      Rolinda Roan 06-23-2015, 12:10 PM   Rolinda Roan, PT, DPT Acute Rehabilitation Services Pager: [phone]

## 2015-06-01 NOTE — Progress Notes (Addendum)
      [address]       [place] [address]             [phone]       12 Days Post-Op Procedure(s) (LRB): EXPLORATION OF LEFT NECK (Left)  Subjective: Has no complaints this am.  Objective: Vital signs in last 24 hours: Temp:  [98.6 F (37 C)-98.9 F (37.2 C)] 98.9 F (37.2 C) (08/03 0323) Pulse Rate:  [70-82] 82 (08/03 0323) Cardiac Rhythm:  [-] Normal sinus rhythm (08/02 1940) Resp:  [18] 18 (08/03 0323) BP: (122-141)/(57-71) 140/65 mmHg (08/03 0323) SpO2:  [95 %-96 %] 96 % (08/03 0323) Weight:  [159 lb 6.3 oz (72.3 kg)] 159 lb 6.3 oz (72.3 kg) (08/03 0323)     Intake/Output from previous day: 08/02 0701 - 08/03 0700 In: 950 [NG/GT:840] Out: 2000 [Urine:2000]   Physical Exam:  Cardiovascular: RRR Pulmonary: Clear to auscultation bilaterally; no rales, wheezes, or rhonchi. Abdomen: Soft, non tender, bowel sounds present. Extremities: No  lower extremity edema. Wounds: Still with light purulent like drainage from neck wounds. Abdominal wound is clean and dry.   Lab Results: CBC: No results for input(s): WBC, HGB, HCT, PLT in the last 72 hours. BMET:  No results for input(s): NA, K, CL, CO2, GLUCOSE, BUN, CREATININE, CALCIUM in the last 72 hours.  PT/INR: No results for input(s): LABPROT, INR in the last 72 hours. ABG:  INR: Will add last result for INR, ABG once components are confirmed Will add last 4 CBG results once components are confirmed  Assessment/Plan:  1. Cervical anastomotic leak-continues to drain  2.GI- Swallow study done two days ago showed an esophageal leak likely at level of the proximal anastomosis.3.ID-on Zosyn (day #10) and Diflucan 4.DM-CBGs 138/194/140. On Insulin. 5. Likely remove remaining abdominal staples by end of week  ZIMMERMAN,DONIELLE MPA-C 06/01/2015,8:01 AM  Wound examined, slowly closing, will allow to take small sips and "hold " neck  I have seen and examined Sharon Price and agree with the  above assessment  and plan.  Grace Isaac MD Beeper [phone] Office [phone] 06/01/2015 11:05 AM

## 2015-06-02 LAB — GLUCOSE, CAPILLARY
Glucose-Capillary: 158 mg/dL — ABNORMAL HIGH (ref 65–99)
Glucose-Capillary: 161 mg/dL — ABNORMAL HIGH (ref 65–99)
Glucose-Capillary: 167 mg/dL — ABNORMAL HIGH (ref 65–99)
Glucose-Capillary: 168 mg/dL — ABNORMAL HIGH (ref 65–99)
Glucose-Capillary: 195 mg/dL — ABNORMAL HIGH (ref 65–99)
Glucose-Capillary: 211 mg/dL — ABNORMAL HIGH (ref 65–99)

## 2015-06-02 NOTE — Progress Notes (Addendum)
      [name]       [place] [address]             [phone]       13 Days Post-Op Procedure(s) (LRB): EXPLORATION OF LEFT NECK (Left)  Subjective: Has no complaints this am.  Objective: Vital signs in last 24 hours: Temp:  [98.2 F (36.8 C)-99 F (37.2 C)] 98.2 F (36.8 C) (08/04 0442) Pulse Rate:  [70-80] 70 (08/04 0442) Cardiac Rhythm:  [-] Normal sinus rhythm (08/03 1940) Resp:  [17-19] 19 (08/04 0442) BP: (128-155)/(55-63) 133/56 mmHg (08/04 0442) SpO2:  [95 %-98 %] 95 % (08/04 0442) Weight:  [158 lb 4.6 oz (71.8 kg)] 158 lb 4.6 oz (71.8 kg) (08/04 0442)     Intake/Output from previous day: 08/03 0701 - 08/04 0700 In: 3285 [P.O.:1700; NG/GT:1585] Out: 900 [Urine:900]   Physical Exam:  Cardiovascular: RRR Pulmonary: Clear to auscultation bilaterally; no rales, wheezes, or rhonchi. Abdomen: Soft, non tender, bowel sounds present. Extremities: No  lower extremity edema. Wounds: Still with light purulent like drainage from neck wounds. Abdominal wound is clean and dry. Small amount of purulence at suture area under right breast.   Lab Results: CBC: No results for input(s): WBC, HGB, HCT, PLT in the last 72 hours. BMET:  No results for input(s): NA, K, CL, CO2, GLUCOSE, BUN, CREATININE, CALCIUM in the last 72 hours.  PT/INR: No results for input(s): LABPROT, INR in the last 72 hours. ABG:  INR: Will add last result for INR, ABG once components are confirmed Will add last 4 CBG results once components are confirmed  Assessment/Plan:  1. Cervical anastomotic leak-continues to drain  2.GI- Swallow study done two days ago showed an esophageal leak likely at level of the proximal anastomosis. 3.ID-on Zosyn (day #11) and Diflucan 4.DM-CBGs 195/168/161. On Insulin. 5.Remove remaining abdominal staples today as well as sutures under breasts  ZIMMERMAN,DONIELLE MPA-C 06/02/2015,8:05 AM   Started sips of water yesterday with "neck  pressure"  Tolerated , decreased drainage and wound slowly closing  Continue tube feeding I have seen and examined Sharon Price and agree with the above assessment  and plan.  Grace Isaac MD Beeper [phone] Office [phone] 06/02/2015 10:04 AM

## 2015-06-02 NOTE — Progress Notes (Signed)
Removed two CT sutures and applied benzoin and 1/2 " steri strips under right breast and under left breast. Placed dry gauze under each breast and instructed patient to keep area as dry as possible.  Removed remaining abdominal staples (13) and applied 1/2" steri strips.  Pt tolerated procedure well. Pt given signs and symptoms of infection. Payton Emerald, RN

## 2015-06-02 NOTE — Progress Notes (Addendum)
Nutrition Follow Up  DOCUMENTATION CODES:   Not applicable  INTERVENTION:    Continue Vital AF 1.2 formula at goal rate of 70 ml/hr   NUTRITION DIAGNOSIS:   Inadequate oral intake related to inability to eat (s/p esophageal resection) as evidenced by NPO status, ongoing  GOAL:   Patient will meet greater than or equal to 90% of their needs, met  MONITOR:   TF tolerance, Weight trends, Labs, I & O's  ASSESSMENT:  69 y.o. 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 The patient has completed neoadjuvant chemotherapy radiation with weekly carboplatin and and paclltaxel in AUG 2015. Her treatment course has been complicated with severe dehydration poor nutrition, requiring a PEG tube to be placed which becameinfected and had to be replaced several times. The patient needed supplementary IV fluids through a PICC line.   Patient s/p procedures 7/12: VIDEO BRONCHOSCOPY  TRANSHIATAL TOTAL ESOPHAGEAL RESECTION FEEDING JEJUNOSTOMY   Patient s/p procedure 7/22: EXPLORATION OF LEFT NECK   Pt remains NPO.  S/p esophagram/barium swallow 8/1 -- showed esophageal leak likely at level of proximal anastomosis.    Vital AF 1.2 continues to infuse at goal rate of 70 ml/h providing 2016 kcals, 126 gm protein, 1361 ml of free water.  Tolerating well.    Diet Order:  Diet NPO time specified  Skin:  Wound (see comment) (Incision on abdomen, L neck, and wound on L knee)  Last BM:  8/3  Height:   Ht Readings from Last 1 Encounters:  05/24/15 _0  (1.549 m)    Weight:   Wt Readings from Last 1 Encounters:  06/02/15 158 lb 4.6 oz (71.8 kg)    Ideal Body Weight:  50 kg  Wt Readings from Last 10 Encounters:  06/02/15 158 lb 4.6 oz (71.8 kg)  05/06/15 157 lb 11.2 oz (71.532 kg)  04/28/15 156 lb (70.761 kg)  04/04/15 156 lb (70.761 kg)  03/31/15 151 lb (68.493 kg)  03/30/15 151 lb  (68.493 kg)  03/15/15 151 lb (68.493 kg)  11/30/14 157 lb 3 oz (71.3 kg)  09/16/14 151 lb (68.493 kg)  08/10/14 136 lb (61.689 kg)    BMI:  Body mass index is 29.92 kg/(m^2).  Estimated Nutritional Needs:   Kcal:  2000-2200  Protein:  110-120 gm  Fluid:  2.0-2.2 L  EDUCATION NEEDS:   No education needs identified at this time  Arthur Holms, RD, LDN Pager #: [phone] After-Hours Pager #: [phone]

## 2015-06-03 LAB — GLUCOSE, CAPILLARY
Glucose-Capillary: 129 mg/dL — ABNORMAL HIGH (ref 65–99)
Glucose-Capillary: 170 mg/dL — ABNORMAL HIGH (ref 65–99)
Glucose-Capillary: 181 mg/dL — ABNORMAL HIGH (ref 65–99)
Glucose-Capillary: 181 mg/dL — ABNORMAL HIGH (ref 65–99)
Glucose-Capillary: 191 mg/dL — ABNORMAL HIGH (ref 65–99)
Glucose-Capillary: 193 mg/dL — ABNORMAL HIGH (ref 65–99)

## 2015-06-03 LAB — CBC
HCT: 31.5 % — ABNORMAL LOW (ref 36.0–46.0)
Hemoglobin: 10.4 g/dL — ABNORMAL LOW (ref 12.0–15.0)
MCH: 30.1 pg (ref 26.0–34.0)
MCHC: 33 g/dL (ref 30.0–36.0)
MCV: 91.3 fL (ref 78.0–100.0)
Platelets: 480 10*3/uL — ABNORMAL HIGH (ref 150–400)
RBC: 3.45 MIL/uL — ABNORMAL LOW (ref 3.87–5.11)
RDW: 15.3 % (ref 11.5–15.5)
WBC: 12.2 10*3/uL — ABNORMAL HIGH (ref 4.0–10.5)

## 2015-06-03 LAB — BASIC METABOLIC PANEL
Anion gap: 10 (ref 5–15)
BUN: 25 mg/dL — ABNORMAL HIGH (ref 6–20)
CO2: 24 mmol/L (ref 22–32)
Calcium: 9 mg/dL (ref 8.9–10.3)
Chloride: 100 mmol/L — ABNORMAL LOW (ref 101–111)
Creatinine, Ser: 0.97 mg/dL (ref 0.44–1.00)
GFR calc Af Amer: >60 mL/min (ref >60)
GFR calc non Af Amer: 59 mL/min — ABNORMAL LOW (ref >60)
Glucose, Bld: 205 mg/dL — ABNORMAL HIGH (ref 65–99)
Potassium: 4.5 mmol/L (ref 3.5–5.1)
Sodium: 134 mmol/L — ABNORMAL LOW (ref 135–145)

## 2015-06-03 MED ORDER — ALTEPLASE 2 MG IJ SOLR
2.0000 mg | Freq: Once | INTRAMUSCULAR | Status: AC
  Administered 2015-06-03: 2 mg
  Filled 2015-06-03: qty 2

## 2015-06-03 NOTE — Progress Notes (Addendum)
      [address]       [place] [address]             [phone]       14 Days Post-Op Procedure(s) (LRB): EXPLORATION OF LEFT NECK (Left)  Subjective: Patient uncomfortable this am as nurse attempting to obtain new IV. Also, patient had bleeding at left neck wound earlier.  Objective: Vital signs in last 24 hours: Temp:  [98.2 F (36.8 C)-98.9 F (37.2 C)] 98.9 F (37.2 C) (08/05 0430) Pulse Rate:  [71-80] 80 (08/05 0430) Cardiac Rhythm:  [-] Normal sinus rhythm (08/04 2041) Resp:  [18] 18 (08/05 0430) BP: (119-136)/(51-63) 119/60 mmHg (08/05 0430) SpO2:  [94 %-95 %] 94 % (08/05 0430) Weight:  [157 lb 11.2 oz (71.532 kg)] 157 lb 11.2 oz (71.532 kg) (08/05 0430)     Intake/Output from previous day: 08/04 0701 - 08/05 0700 In: 20 [I.V.:20] Out: 1675 [Urine:1675]   Physical Exam:  Cardiovascular: RRR Pulmonary: Clear to auscultation bilaterally; no rales, wheezes, or rhonchi. Abdomen: Soft, non tender, bowel sounds present. Extremities: No  lower extremity edema. Wounds: Still with pink tinged purulent like drainage from neck wounds. Abdominal wound is clean and dry.    Lab Results: CBC:  Recent Labs  06/03/15 0731  WBC 12.2*  HGB 10.4*  HCT 31.5*  PLT 480*   BMET:  No results for input(s): NA, K, CL, CO2, GLUCOSE, BUN, CREATININE, CALCIUM in the last 72 hours.  PT/INR: No results for input(s): LABPROT, INR in the last 72 hours. ABG:  INR: Will add last result for INR, ABG once components are confirmed Will add last 4 CBG results once components are confirmed  Assessment/Plan:  1. Cervical anastomotic leak-continues to drain  2.GI- Started sips of water two days ago. 3.ID-Mild leukocytosis with WBC 12,200. On Zosyn (day #12) and Diflucan 4.DM-CBGs 193/181/191. On Insulin. 5.Anemia-H and H stable at 10.4 and 31.5  Sharon Price B GerhardtPA-C 06/03/2015,8:56 AM  Will stop antibiotics and diflucan at total of 14 days D/c right IJ line  today Neck drainage decreasing small amount of pink fluid noted on dressing no active "bleeding" I have seen and examined Sharon Price and agree with the above assessment  and plan.  Grace Isaac MD Beeper [phone] Office [phone] 06/03/2015 8:56 AM

## 2015-06-03 NOTE — Progress Notes (Signed)
RN called to bedside to assess bleeding on the left side of pt neck. RN in to assess pt; moderate amount of bright red blood on dressing to the left side of pt neck. Pt not in any distress at this time. Pressure applied to stop bleeding and dressing changed.   RN called Dr. Servando Snare; no response. RN then paged Dr. Cyndia Bent. Bartle aware & informed RN to please contact Dr. Servando Snare and that PA would be around soon. Unable to reach Dr. Servando Snare at this time.   Oncoming RN was there to assess pt as well and is aware of complications this am. Bleeding controlled at this time. Pt resting in bed with call bell within reach. RN will cont to monitor.

## 2015-06-03 NOTE — Care Management Note (Signed)
Case Management Note  Patient Details  Name: Sharon Price MRN: [id number] Date of Birth: 1945/11/19  Subjective/Objective:       Pt admitted with esophageal cancer             Action/Plan:  Pt is independent from home.  Pt was active with [facility] earlier this year for PT.  Pt is unsure which agency provided dme and tube feeds.  CM will monitor for disposition needs   Expected Discharge Date:                  Expected Discharge Plan:  [place]  In-House Referral:     Discharge planning Services  CM Consult  Post Acute Care Choice:    Choice offered to:     DME Arranged:    DME Agency:     HH Arranged:    HH Agency:     Status of Service:  In process, will continue to follow  Medicare Important Message Given:    Date Medicare IM Given:    Medicare IM give by:    Date Additional Medicare IM Given:    Additional Medicare Important Message give by:     If discussed at [place] of Stay Meetings, dates discussed:    Additional Comments: CM assessed pt, pt stated she would like to use the same agency used previously [facility].  Agency verified with CM that pt was discharged from services in March 2016.  Agency stated that tube feeds or IV meds will need to be set up with [facility], [facility] can provide HH.  CM will continue to monitor for disposition needs. Maryclare Labrador, RN 06/03/2015, 4:22 PM

## 2015-06-03 NOTE — Progress Notes (Signed)
Physical Therapy Treatment Patient Details Name: Sharon Price MRN: [id number] DOB: 21-Oct-1946 Today's Date: 06/03/2015    History of Present Illness Adm for surgical esophagectomy 7/12; return to OR 7/20 for cervical anastomosis leak; (pt s/p esophageal cancer and has completed chemotherapy/radiation. Her treatment course has been complicated with severe dehydration poor nutrition, requiring a PEG tube to be placed which became infected and had to be replaced several times. PMHx- DM, PE, CKD    PT Comments    Pt progressing towards physical therapy goals. Was able to ambulate with quicker gait speed, less seated rest breaks, and improved posture compared to previous session. Has shown good improvement with distance, and now would like to see pt improve with therapeutic exercise next session as well. Will continue to follow.   Follow Up Recommendations  Supervision/Assistance - 24 hour;Home health PT     Equipment Recommendations  None recommended by PT    Recommendations for Other Services       Precautions / Restrictions Precautions Precautions: Fall Precaution Comments: abdominal incision Restrictions Weight Bearing Restrictions: No    Mobility  Bed Mobility Overal bed mobility: Needs Assistance Bed Mobility: Rolling;Sidelying to Sit Rolling: Supervision Sidelying to sit: Min guard       General bed mobility comments: Heavy use of rail for full roll. Hands-on guarding for trunk stability as pt elevated to full sitting position EOB. Bed pad used to complete scooting so feet were resting on the ground.   Transfers Overall transfer level: Needs assistance Equipment used: Rolling walker (2 wheeled) Transfers: Sit to/from Stand Sit to Stand: Min guard         General transfer comment: Pt demonstrated proper hand placement and safety awareness during sit<>stand. Hands-on assist for safety.   Ambulation/Gait Ambulation/Gait assistance: Min guard Ambulation Distance  (Feet): 390 Feet Assistive device: Rolling walker (2 wheeled) Gait Pattern/deviations: Step-through pattern;Decreased stride length;Trunk flexed Gait velocity: Decreased grossly; improved from last session Gait velocity interpretation: Below normal speed for age/gender General Gait Details: Grossly flexed posture. Attempts improving upright position with cueing however is not able to maintain. 1 seated rest breaks required throughout gait training.    Stairs            Wheelchair Mobility    Modified Rankin (Stroke Patients Only)       Balance Overall balance assessment: Needs assistance Sitting-balance support: Feet supported;No upper extremity supported Sitting balance-Leahy Scale: Fair     Standing balance support: Bilateral upper extremity supported;During functional activity Standing balance-Leahy Scale: Fair                      Cognition Arousal/Alertness: Awake/alert Behavior During Therapy: WFL for tasks assessed/performed Overall Cognitive Status: Within Functional Limits for tasks assessed                      Exercises      General Comments        Pertinent Vitals/Pain Pain Assessment: Faces Faces Pain Scale: Hurts a little bit Pain Location: abdomen, back Pain Descriptors / Indicators: Operative site guarding;Aching Pain Intervention(s): Heat applied (to back - had water flow heating pad in room)    Home Living                      Prior Function            PT Goals (current goals can now be found in the care plan section) Acute Rehab  PT Goals Patient Stated Goal: Return to PLOF PT Goal Formulation: With patient Time For Goal Achievement: 06/08/15 Potential to Achieve Goals: Good Progress towards PT goals: Progressing toward goals    Frequency  Min 3X/week    PT Plan Current plan remains appropriate    Co-evaluation             End of Session Equipment Utilized During Treatment: Gait belt Activity  Tolerance: Patient limited by fatigue Patient left: in chair;with call bell/phone within reach     Time: 1448-1856 PT Time Calculation (min) (ACUTE ONLY): 25 min  Charges:  $Gait Training: 8-22 mins $Therapeutic Activity: 8-22 mins                    G Codes:      Rolinda Roan 2015/06/20, 1:44 PM   Rolinda Roan, PT, DPT Acute Rehabilitation Services Pager: [phone]

## 2015-06-04 LAB — GLUCOSE, CAPILLARY
Glucose-Capillary: 140 mg/dL — ABNORMAL HIGH (ref 65–99)
Glucose-Capillary: 156 mg/dL — ABNORMAL HIGH (ref 65–99)
Glucose-Capillary: 159 mg/dL — ABNORMAL HIGH (ref 65–99)
Glucose-Capillary: 211 mg/dL — ABNORMAL HIGH (ref 65–99)
Glucose-Capillary: 228 mg/dL — ABNORMAL HIGH (ref 65–99)
Glucose-Capillary: 239 mg/dL — ABNORMAL HIGH (ref 65–99)

## 2015-06-04 NOTE — Progress Notes (Addendum)
      [address]       ,[place] [address]             [phone]      15 Days Post-Op Procedure(s) (LRB): EXPLORATION OF LEFT NECK (Left)   Subjective:  Ms. Depaula has no new complaints this morning.  She is tolerating sips of liquid and ice chips.  + ambulation + BM  Objective: Vital signs in last 24 hours: Temp:  [98.4 F (36.9 C)-98.9 F (37.2 C)] 98.4 F (36.9 C) (08/06 0523) Pulse Rate:  [70-85] 85 (08/06 0523) Cardiac Rhythm:  [-] Normal sinus rhythm;Heart block (08/06 0851) Resp:  [18] 18 (08/06 0523) BP: (118-145)/(48-64) 145/64 mmHg (08/06 1027) SpO2:  [94 %-97 %] 97 % (08/06 0523) Weight:  [158 lb 1.6 oz (71.714 kg)] 158 lb 1.6 oz (71.714 kg) (08/06 0523)  Intake/Output from previous day: 08/05 0701 - 08/06 0700 In: 20 [I.V.:20] Out: 1350 [Urine:1350]  General appearance: alert, cooperative and no distress Heart: regular rate and rhythm Lungs: clear to auscultation bilaterally Abdomen: soft, non-tender; bowel sounds normal; no masses,  no organomegaly Extremities: extremities normal, atraumatic, no cyanosis or edema Wound: + purulent drainage from neck wound, malodorous, no infection  Lab Results:  Recent Labs  06/03/15 0731  WBC 12.2*  HGB 10.4*  HCT 31.5*  PLT 480*   BMET:  Recent Labs  06/03/15 0731  NA 134*  K 4.5  CL 100*  CO2 24  GLUCOSE 205*  BUN 25*  CREATININE 0.97  CALCIUM 9.0    PT/INR: No results for input(s): LABPROT, INR in the last 72 hours. ABG    Component Value Date/Time   PHART 7.353 05/11/2015 0356   HCO3 20.8 05/11/2015 0356   TCO2 22 05/11/2015 0356   ACIDBASEDEF 4.0* 05/11/2015 0356   O2SAT 94.0 05/11/2015 0356   CBG (last 3)   Recent Labs  06/04/15 0001 06/04/15 0505 06/04/15 0729  GLUCAP 211* 140* 156*    Assessment/Plan: S/P Procedure(s) (LRB): EXPLORATION OF LEFT NECK (Left)  1.Cervical anastamotic leak- continues to drain 2. GI- tolerating ice chips and sips of water,  continue tube feeds 3. ID Day 13 zosyn, Diflucan 4. DM- cbgs controlled 5. dispo- continue current care   LOS: 25 days    BARRETT, ERIN 06/04/2015   Chart reviewed, patient examined, agree with above.

## 2015-06-05 LAB — GLUCOSE, CAPILLARY
Glucose-Capillary: 144 mg/dL — ABNORMAL HIGH (ref 65–99)
Glucose-Capillary: 158 mg/dL — ABNORMAL HIGH (ref 65–99)
Glucose-Capillary: 167 mg/dL — ABNORMAL HIGH (ref 65–99)
Glucose-Capillary: 173 mg/dL — ABNORMAL HIGH (ref 65–99)
Glucose-Capillary: 200 mg/dL — ABNORMAL HIGH (ref 65–99)
Glucose-Capillary: 210 mg/dL — ABNORMAL HIGH (ref 65–99)
Glucose-Capillary: 267 mg/dL — ABNORMAL HIGH (ref 65–99)

## 2015-06-05 MED ORDER — INSULIN DETEMIR 100 UNIT/ML ~~LOC~~ SOLN
25.0000 [IU] | Freq: Two times a day (BID) | SUBCUTANEOUS | Status: DC
  Administered 2015-06-05 – 2015-06-17 (×24): 25 [IU] via SUBCUTANEOUS
  Filled 2015-06-05 (×27): qty 0.25

## 2015-06-05 NOTE — Progress Notes (Signed)
      [address]       [place] [address]             [phone]      16 Days Post-Op Procedure(s) (LRB): EXPLORATION OF LEFT NECK (Left)   Subjective:  Without complaints.  Neck continues to drain.  Tolerating ice chips and sips of water  Objective: Vital signs in last 24 hours: Temp:  [98.3 F (36.8 C)-99 F (37.2 C)] 98.8 F (37.1 C) (08/07 0432) Pulse Rate:  [76-91] 76 (08/07 1020) Cardiac Rhythm:  [-] Normal sinus rhythm;Heart block (08/07 0930) Resp:  [16-18] 18 (08/07 0432) BP: (128-137)/(48-64) 128/64 mmHg (08/07 1020) SpO2:  [94 %-96 %] 96 % (08/07 0432) Weight:  [157 lb 3 oz (71.3 kg)] 157 lb 3 oz (71.3 kg) (08/07 0432)  Intake/Output from previous day: 08/06 0701 - 08/07 0700 In: 940 [NG/GT:840; IV Piggyback:100] Out: 1378 [Urine:1376; Stool:2]  General appearance: alert, cooperative and no distress Heart: regular rate and rhythm Lungs: clear to auscultation bilaterally Abdomen: soft, non-tender; bowel sounds normal; no masses,  no organomegaly Wound: neck wound continues to drain malodorous.... Abdominal wound is clean and dry  Lab Results:  Recent Labs  06/03/15 0731  WBC 12.2*  HGB 10.4*  HCT 31.5*  PLT 480*   BMET:  Recent Labs  06/03/15 0731  NA 134*  K 4.5  CL 100*  CO2 24  GLUCOSE 205*  BUN 25*  CREATININE 0.97  CALCIUM 9.0    PT/INR: No results for input(s): LABPROT, INR in the last 72 hours. ABG    Component Value Date/Time   PHART 7.353 05/11/2015 0356   HCO3 20.8 05/11/2015 0356   TCO2 22 05/11/2015 0356   ACIDBASEDEF 4.0* 05/11/2015 0356   O2SAT 94.0 05/11/2015 0356   CBG (last 3)   Recent Labs  06/05/15 0431 06/05/15 0821 06/05/15 1130  GLUCAP 158* 200* 267*    Assessment/Plan: S/P Procedure(s) (LRB): EXPLORATION OF LEFT NECK (Left)  1. Cervical Anastamotic Leak- continues to drain, continue dressing changes 2. GI- tolerating ice chips, sips of water, continue tube feeds 3. ID- Zosyn,  Diflucan Day 14, will d/c in AM 4. DM- cbgs are elevated in mid 200s, will increase Levemir to 25 unit BID 5. Dispo- continue current care   LOS: 26 days    Tyreek Clabo, Junie Panning 06/05/2015

## 2015-06-06 LAB — GLUCOSE, CAPILLARY
Glucose-Capillary: 125 mg/dL — ABNORMAL HIGH (ref 65–99)
Glucose-Capillary: 139 mg/dL — ABNORMAL HIGH (ref 65–99)
Glucose-Capillary: 154 mg/dL — ABNORMAL HIGH (ref 65–99)
Glucose-Capillary: 173 mg/dL — ABNORMAL HIGH (ref 65–99)
Glucose-Capillary: 183 mg/dL — ABNORMAL HIGH (ref 65–99)
Glucose-Capillary: 293 mg/dL — ABNORMAL HIGH (ref 65–99)

## 2015-06-06 NOTE — Progress Notes (Addendum)
      [address]       [place] [address]             [phone]      17 Days Post-Op Procedure(s) (LRB): EXPLORATION OF LEFT NECK (Left)   Subjective:  Ms. Fournier has no complaints.  Her neck wound continues to drain.  Objective: Vital signs in last 24 hours: Temp:  [98.4 F (36.9 C)-99.2 F (37.3 C)] 98.4 F (36.9 C) (08/08 0417) Pulse Rate:  [76-81] 81 (08/08 0417) Cardiac Rhythm:  [-] Normal sinus rhythm;Heart block (08/07 2015) Resp:  [16-18] 16 (08/08 0417) BP: (120-145)/(54-64) 120/54 mmHg (08/08 0417) SpO2:  [93 %-96 %] 93 % (08/08 0417) Weight:  [158 lb 1.6 oz (71.714 kg)] 158 lb 1.6 oz (71.714 kg) (08/08 0417)  Intake/Output from previous day: 08/07 0701 - 08/08 0700 In: 1830 [NG/GT:1680; IV Piggyback:150] Out: 1150 [Urine:1150] Intake/Output this shift: Total I/O In: -  Out: 300 [Urine:300]  General appearance: alert, cooperative and no distress Heart: regular rate and rhythm Lungs: clear to auscultation bilaterally Abdomen: soft, non-tender; bowel sounds normal; no masses,  no organomegaly Extremities: extremities normal, atraumatic, no cyanosis or edema Wound: neck wound open, mucousy drainage present, no frank evidence of infection  Lab Results: No results for input(s): WBC, HGB, HCT, PLT in the last 72 hours. BMET: No results for input(s): NA, K, CL, CO2, GLUCOSE, BUN, CREATININE, CALCIUM in the last 72 hours.  PT/INR: No results for input(s): LABPROT, INR in the last 72 hours. ABG    Component Value Date/Time   PHART 7.353 05/11/2015 0356   HCO3 20.8 05/11/2015 0356   TCO2 22 05/11/2015 0356   ACIDBASEDEF 4.0* 05/11/2015 0356   O2SAT 94.0 05/11/2015 0356   CBG (last 3)   Recent Labs  06/05/15 2355 06/06/15 0416 06/06/15 0806  GLUCAP 210* 154* 139*    Assessment/Plan: S/P Procedure(s) (LRB): EXPLORATION OF LEFT NECK (Left)  1. Cervical Anastomotic Leak- continues to drain, continue dressing changes 2. GI-  tolerating ice chips, sips- continue tube feeds 3. ID- has completed 14 day course of Zosyn, Diflucan will d/c ABX 4. DM- cbgs are somewhat better continue levemir for now can titrate further if necessary 5. Dispo- patient stable, neck continues to drain, continue tube feeds   LOS: 27 days    BARRETT, ERIN 06/06/2015  Drainage from  continues to decrease , site of penrose now completely healed tolerating tube feeding I have seen and examined Casilda Carls and agree with the above assessment  and plan.  Grace Isaac MD Beeper [phone] Office [phone] 06/06/2015 5:17 PM

## 2015-06-06 NOTE — Progress Notes (Signed)
Physical Therapy Treatment Patient Details Name: MONIKA CHESTANG MRN: [id number] DOB: [birth year] Today's Date: 06/06/2015    History of Present Illness Adm for surgical esophagectomy 7/12; return to OR 7/20 for cervical anastomosis leak; (pt s/p esophageal cancer and has completed chemotherapy/radiation. Her treatment course has been complicated with severe dehydration poor nutrition, requiring a PEG tube to be placed which became infected and had to be replaced several times. PMHx- DM, PE, CKD    PT Comments    Pt progressing towards physical therapy goals. Was able to perform transfers and ambulation with min guard progressing to supervision. Pt prefers the rollator, and does well managing ambulation with less stability. Good safety awareness when turning to sit on a locked rollator. Would benefit from having a rollator that will fit her better than what is available on the unit. States she wants one for home.  Follow Up Recommendations  Home health PT;Supervision/Assistance - 24 hour     Equipment Recommendations  Other (comment) (Rollator)    Recommendations for Other Services       Precautions / Restrictions Precautions Precautions: Fall Precaution Comments: abdominal incision Restrictions Weight Bearing Restrictions: No    Mobility  Bed Mobility               General bed mobility comments: Pt sitting up in chair upon PT arrival.   Transfers Overall transfer level: Needs assistance Equipment used: 4-wheeled walker Transfers: Sit to/from Stand Sit to Stand: Min guard         General transfer comment: Pt demonstrated proper hand placement and safety awareness during sit<>stand. Hands-on assist for safety.   Ambulation/Gait Ambulation/Gait assistance: Min guard;Supervision Ambulation Distance (Feet): 300 Feet Assistive device: Rolling walker (2 wheeled) Gait Pattern/deviations: Step-through pattern;Decreased stride length;Trunk flexed Gait velocity:  Decreased grossly; improved from last session Gait velocity interpretation: Below normal speed for age/gender General Gait Details: Grossly flexed posture. Attempts improving upright position with cueing however is not able to maintain. 2 seated rest breaks required throughout gait training.    Stairs            Wheelchair Mobility    Modified Rankin (Stroke Patients Only)       Balance Overall balance assessment: Needs assistance Sitting-balance support: Feet supported;No upper extremity supported Sitting balance-Leahy Scale: Fair     Standing balance support: Bilateral upper extremity supported Standing balance-Leahy Scale: Fair                      Cognition Arousal/Alertness: Awake/alert Behavior During Therapy: WFL for tasks assessed/performed Overall Cognitive Status: Within Functional Limits for tasks assessed                      Exercises General Exercises - Lower Extremity Quad Sets: 15 reps Long Arc Quad: 15 reps Heel Slides: 15 reps Hip ABduction/ADduction: 15 reps Straight Leg Raises: 10 reps    General Comments        Pertinent Vitals/Pain Pain Assessment: No/denies pain    Home Living                      Prior Function            PT Goals (current goals can now be found in the care plan section) Acute Rehab PT Goals Patient Stated Goal: Return to PLOF PT Goal Formulation: With patient Time For Goal Achievement: 06/08/15 Potential to Achieve Goals: Good Progress towards PT goals: Progressing toward goals  Frequency  Min 3X/week    PT Plan Current plan remains appropriate    Co-evaluation             End of Session Equipment Utilized During Treatment: Gait belt Activity Tolerance: Patient limited by fatigue Patient left: in chair;with call bell/phone within reach     Time: 1126-1204 PT Time Calculation (min) (ACUTE ONLY): 38 min  Charges:  $Gait Training: 8-22 mins $Therapeutic Activity:  23-37 mins                    G Codes:      Rolinda Roan 07-03-15, 1:17 PM   Rolinda Roan, PT, DPT Acute Rehabilitation Services Pager: [phone]

## 2015-06-07 LAB — GLUCOSE, CAPILLARY
Glucose-Capillary: 159 mg/dL — ABNORMAL HIGH (ref 65–99)
Glucose-Capillary: 163 mg/dL — ABNORMAL HIGH (ref 65–99)
Glucose-Capillary: 178 mg/dL — ABNORMAL HIGH (ref 65–99)
Glucose-Capillary: 186 mg/dL — ABNORMAL HIGH (ref 65–99)
Glucose-Capillary: 218 mg/dL — ABNORMAL HIGH (ref 65–99)
Glucose-Capillary: 227 mg/dL — ABNORMAL HIGH (ref 65–99)
Glucose-Capillary: 244 mg/dL — ABNORMAL HIGH (ref 65–99)

## 2015-06-07 NOTE — Progress Notes (Signed)
Utilization review completed.  

## 2015-06-07 NOTE — Care Management Note (Signed)
Case Management Note CM note started by Grenelefe  Patient Details  Name: Sharon Price MRN: [id number] Date of Birth: 1946/03/21  Subjective/Objective:       Pt admitted with esophageal cancer             Action/Plan:  Pt is independent from home.  Pt was active with [facility] earlier this year for PT.  Pt is unsure which agency provided dme and tube feeds.  CM will monitor for disposition needs   Expected Discharge Date:                  Expected Discharge Plan:  [place]  In-House Referral:     Discharge planning Services  CM Consult  Post Acute Care Choice:    Choice offered to:     DME Arranged:    DME Agency:     HH Arranged:    HH Agency:     Status of Service:  In process, will continue to follow  Medicare Important Message Given:    Date Medicare IM Given:    Medicare IM give by:    Date Additional Medicare IM Given:    Additional Medicare Important Message give by:     If discussed at [place] of Stay Meetings, dates discussed:  06/07/15  Additional Comments:  06/07/15- pt continues to have leak, on TF and sips/chips- NCM to continue to follow for d/c needs  CM assessed pt, pt stated she would like to use the same agency used previously [facility].  Agency verified with CM that pt was discharged from services in March 2016.  Agency stated that tube feeds or IV meds will need to be set up with [facility], [facility] can provide HH.  CM will continue to monitor for disposition needs.  Dawayne Patricia, RN 06/07/2015, 10:10 AM

## 2015-06-07 NOTE — Progress Notes (Addendum)
      [address]       [place] [address]             [phone]      18 Days Post-Op Procedure(s) (LRB): EXPLORATION OF LEFT NECK (Left)   Subjective:  Sharon Price has no complaints.   Neck continues to drain  Objective: Vital signs in last 24 hours: Temp:  [99.7 F (37.6 C)-99.8 F (37.7 C)] 99.8 F (37.7 C) (08/09 0507) Pulse Rate:  [89] 89 (08/09 0507) Cardiac Rhythm:  [-] Normal sinus rhythm;Heart block (08/08 2030) Resp:  [18] 18 (08/09 0507) BP: (117-124)/(56-57) 117/57 mmHg (08/09 0507) SpO2:  [93 %-96 %] 93 % (08/09 0507) Weight:  [152 lb 5.4 oz (69.1 kg)] 152 lb 5.4 oz (69.1 kg) (08/09 0507)  Intake/Output from previous day: 08/08 0701 - 08/09 0700 In: -  Out: 1100 [Urine:1100]  General appearance: alert, cooperative and no distress Heart: regular rate and rhythm Lungs: clear to auscultation bilaterally Abdomen: soft, non-tender; bowel sounds normal; no masses,  no organomegaly Extremities: extremities normal, atraumatic, no cyanosis or edema Wound: thick drainage from neck wound, penrose site healed  Lab Results: No results for input(s): WBC, HGB, HCT, PLT in the last 72 hours. BMET: No results for input(s): NA, K, CL, CO2, GLUCOSE, BUN, CREATININE, CALCIUM in the last 72 hours.  PT/INR: No results for input(s): LABPROT, INR in the last 72 hours. ABG    Component Value Date/Time   PHART 7.353 05/11/2015 0356   HCO3 20.8 05/11/2015 0356   TCO2 22 05/11/2015 0356   ACIDBASEDEF 4.0* 05/11/2015 0356   O2SAT 94.0 05/11/2015 0356   CBG (last 3)   Recent Labs  06/06/15 2346 06/07/15 0023 06/07/15 0444  GLUCAP 173* 159* 186*    Assessment/Plan: S/P Procedure(s) (LRB): EXPLORATION OF LEFT NECK (Left)  1. Cervical Anastomotic leak- continues to drain, continue dressing changes 2. GI- tolerating ice chips, sips 3. DM- cbgs controlled 4. Dispo- patient stable, neck continues to leak, continue current care   LOS: 28 days     BARRETT, ERIN 06/07/2015  I have seen and examined Sharon Price and agree with the above assessment  and plan.  Grace Isaac MD Beeper [phone] Office [phone] 06/07/2015 6:38 PM

## 2015-06-08 LAB — GLUCOSE, CAPILLARY
Glucose-Capillary: 143 mg/dL — ABNORMAL HIGH (ref 65–99)
Glucose-Capillary: 180 mg/dL — ABNORMAL HIGH (ref 65–99)
Glucose-Capillary: 218 mg/dL — ABNORMAL HIGH (ref 65–99)
Glucose-Capillary: 157 mg/dL — ABNORMAL HIGH (ref 65–99)
Glucose-Capillary: 140 mg/dL — ABNORMAL HIGH (ref 65–99)

## 2015-06-08 NOTE — Progress Notes (Signed)
Physical Therapy Treatment Patient Details Name: Sharon Price MRN: [id number] DOB: 22-Mar-1946 Today's Date: 06/08/2015    History of Present Illness Adm for surgical esophagectomy 7/12; return to OR 7/20 for cervical anastomosis leak; (pt s/p esophageal cancer and has completed chemotherapy/radiation. Her treatment course has been complicated with severe dehydration poor nutrition, requiring a PEG tube to be placed which became infected and had to be replaced several times. PMHx- DM, PE, CKD    PT Comments    Patient progressing well towards PT goals. Improved ambulation distance and only required 1 seated rest break during gait training today- improved from prior session. Continues to require cues for upright posture. Able to demonstrate safe use of rollator seat and brakes without cues. Will follow acutely to maximize independence and mobility prior to return home. Encouraged ambulation daily with nursing.   Follow Up Recommendations  Home health PT;Supervision/Assistance - 24 hour     Equipment Recommendations  Other (comment) (rollator)    Recommendations for Other Services       Precautions / Restrictions Precautions Precautions: Fall Precaution Comments: abdominal incision Restrictions Weight Bearing Restrictions: No    Mobility  Bed Mobility               General bed mobility comments: Pt sitting up in chair upon PT arrival.   Transfers Overall transfer level: Needs assistance Equipment used: 4-wheeled walker Transfers: Sit to/from Stand Sit to Stand: Min guard         General transfer comment: Pt demonstrated proper hand placement and safety awareness during sit<>stand. Required use of body momentum to stand and able to boost up on second attempt.  Ambulation/Gait Ambulation/Gait assistance: Supervision Ambulation Distance (Feet): 325 Feet Assistive device: 4-wheeled walker Gait Pattern/deviations: Step-through pattern;Decreased stride length;Trunk  flexed   Gait velocity interpretation: Below normal speed for age/gender General Gait Details: Grossly flexed posture. Verbal and manual cues for upright positioning; pt able to facilitate but not able to maintain. 1 seated rest break. HR ranged from 75-98 bpm.   Stairs            Wheelchair Mobility    Modified Rankin (Stroke Patients Only)       Balance Overall balance assessment: Needs assistance Sitting-balance support: Feet supported;No upper extremity supported Sitting balance-Leahy Scale: Fair     Standing balance support: During functional activity Standing balance-Leahy Scale: Fair                      Cognition Arousal/Alertness: Awake/alert Behavior During Therapy: WFL for tasks assessed/performed Overall Cognitive Status: Within Functional Limits for tasks assessed                      Exercises      General Comments        Pertinent Vitals/Pain Pain Assessment: No/denies pain    Home Living                      Prior Function            PT Goals (current goals can now be found in the care plan section) Progress towards PT goals: Progressing toward goals    Frequency  Min 3X/week    PT Plan Current plan remains appropriate    Co-evaluation             End of Session Equipment Utilized During Treatment: Gait belt Activity Tolerance: Patient tolerated treatment well Patient left: in chair;with  call bell/phone within reach     Time: 1430-1450 PT Time Calculation (min) (ACUTE ONLY): 20 min  Charges:  $Gait Training: 8-22 mins                    G Codes:      [place] 06/08/2015, 3:21 PM Sharon Price, Sharon Price, Sharon Price [phone]

## 2015-06-08 NOTE — Progress Notes (Addendum)
      [address]       [place] [address]             [phone]      19 Days Post-Op Procedure(s) (LRB): EXPLORATION OF LEFT NECK (Left)   Subjective:  Sharon Price continues to have no complaints.  She states they just changed her neck dressing  Objective: Vital signs in last 24 hours: Temp:  [98.5 F (36.9 C)-98.9 F (37.2 C)] 98.5 F (36.9 C) (08/10 0300) Pulse Rate:  [86-90] 90 (08/10 0300) Cardiac Rhythm:  [-] Normal sinus rhythm;Heart block (08/10 0300) Resp:  [16] 16 (08/10 0300) BP: (119-125)/(48-61) 125/61 mmHg (08/10 0300) SpO2:  [95 %-96 %] 95 % (08/10 0300) Weight:  [158 lb 8.2 oz (71.9 kg)] 158 lb 8.2 oz (71.9 kg) (08/10 0300)  Intake/Output from previous day: 08/09 0701 - 08/10 0700 In: -  Out: 1150 [Urine:1150]  General appearance: alert, cooperative and no distress Heart: regular rate and rhythm Lungs: clear to auscultation bilaterally Abdomen: soft, non-tender; bowel sounds normal; no masses,  no organomegaly Extremities: extremities normal, atraumatic, no cyanosis or edema Wound: neck drainage continues to decrease, wound is otherwise clean appears to be quite smaller  Lab Results: No results for input(s): WBC, HGB, HCT, PLT in the last 72 hours. BMET: No results for input(s): NA, K, CL, CO2, GLUCOSE, BUN, CREATININE, CALCIUM in the last 72 hours.  PT/INR: No results for input(s): LABPROT, INR in the last 72 hours. ABG    Component Value Date/Time   PHART 7.353 05/11/2015 0356   HCO3 20.8 05/11/2015 0356   TCO2 22 05/11/2015 0356   ACIDBASEDEF 4.0* 05/11/2015 0356   O2SAT 94.0 05/11/2015 0356   CBG (last 3)   Recent Labs  06/07/15 2030 06/07/15 2346 06/08/15 0415  GLUCAP 178* 227* 143*    Assessment/Plan: S/P Procedure(s) (LRB): EXPLORATION OF LEFT NECK (Left)  1. Cervical Anastomotic Leak- drainage is decreasing, per nursing changed dressing only once yesterday to due drainage 2. GI- continue tube feeds,  tolerating ice chips, sips 3. DM- some sugars over 200, otherwise controlled for the most part, will monitor can increase Levemir if necessary 4. Dispo- stable, continue current care   LOS: 29 days    BARRETT, ERIN 06/08/2015  Advance to clear liquid diet Drainage decreasing  I have seen and examined Casilda Carls and agree with the above assessment  and plan.  Grace Isaac MD Beeper [phone] Office [phone] 06/08/2015 5:55 PM

## 2015-06-09 LAB — GLUCOSE, CAPILLARY
Glucose-Capillary: 151 mg/dL — ABNORMAL HIGH (ref 65–99)
Glucose-Capillary: 155 mg/dL — ABNORMAL HIGH (ref 65–99)
Glucose-Capillary: 161 mg/dL — ABNORMAL HIGH (ref 65–99)
Glucose-Capillary: 184 mg/dL — ABNORMAL HIGH (ref 65–99)
Glucose-Capillary: 193 mg/dL — ABNORMAL HIGH (ref 65–99)
Glucose-Capillary: 281 mg/dL — ABNORMAL HIGH (ref 65–99)

## 2015-06-09 NOTE — Progress Notes (Addendum)
      [address]       [place] [address]             [phone]      20 Days Post-Op Procedure(s) (LRB): EXPLORATION OF LEFT NECK (Left)   Subjective:  Sharon Price has no new complaints.  Tolerating liquid diet.  No nausea or vomiting.  Objective: Vital signs in last 24 hours: Temp:  [98.5 F (36.9 C)-98.9 F (37.2 C)] 98.5 F (36.9 C) (08/11 0425) Pulse Rate:  [76-93] 81 (08/11 0425) Cardiac Rhythm:  [-] Normal sinus rhythm;Heart block (08/10 2000) Resp:  [17-18] 17 (08/11 0425) BP: (116-163)/(54-74) 133/72 mmHg (08/11 0425) SpO2:  [95 %-98 %] 96 % (08/11 0425) Weight:  [158 lb 3.2 oz (71.759 kg)] 158 lb 3.2 oz (71.759 kg) (08/11 0425)  Intake/Output from previous day: 08/10 0701 - 08/11 0700 In: 10 [I.V.:10] Out: 1650 [Urine:1450; Drains:200]  General appearance: alert, cooperative and no distress Heart: regular rate and rhythm Lungs: clear to auscultation bilaterally Abdomen: soft, non-tender; bowel sounds normal; no masses,  no organomegaly Wound: Abd clean and dry, neck continues to drain  Lab Results: No results for input(s): WBC, HGB, HCT, PLT in the last 72 hours. BMET: No results for input(s): NA, K, CL, CO2, GLUCOSE, BUN, CREATININE, CALCIUM in the last 72 hours.  PT/INR: No results for input(s): LABPROT, INR in the last 72 hours. ABG    Component Value Date/Time   PHART 7.353 05/11/2015 0356   HCO3 20.8 05/11/2015 0356   TCO2 22 05/11/2015 0356   ACIDBASEDEF 4.0* 05/11/2015 0356   O2SAT 94.0 05/11/2015 0356   CBG (last 3)   Recent Labs  06/08/15 2018 06/08/15 2359 06/09/15 0422  GLUCAP 140* 193* 151*    Assessment/Plan: S/P Procedure(s) (LRB): EXPLORATION OF LEFT NECK (Left)  1. Cervical anastomotic leak- continue dressing changes 2. GI- continue tube feeds, tolerating clears 3. CBGs- remain mostly controlled, continue Levemir 4. Dispo- on clears, tolerating without difficulty, continues to have leak, continue current  care   LOS: 30 days    Ahmed Prima, ERIN 06/09/2015  Drainage continues to decrease,  discuused options with patient poss snf vs home, she has been at [facility] before and was satisfied with care I have seen and examined Sharon Price and agree with the above assessment  and plan.  Grace Isaac MD Beeper [phone] Office [phone] 06/09/2015 6:57 PM

## 2015-06-09 NOTE — Progress Notes (Signed)
Nutrition Follow Up  DOCUMENTATION CODES:   Not applicable  INTERVENTION:    Continue Vital AF 1.2 formula at goal rate of 70 ml/hr   NEW NUTRITION DIAGNOSIS:   Increased nutrient needs related to wound healing as evidenced by estimated needs, ongoing  GOAL:   Patient will meet greater than or equal to 90% of their needs, met  MONITOR:   TF tolerance, PO intake, Labs, I & O's  ASSESSMENT:  69 y.o. 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 The patient has completed neoadjuvant chemotherapy radiation with weekly carboplatin and and paclltaxel in AUG 2015. Her treatment course has been complicated with severe dehydration poor nutrition, requiring a PEG tube to be placed which becameinfected and had to be replaced several times. The patient needed supplementary IV fluids through a PICC line.   Patient s/p procedures 7/12: VIDEO BRONCHOSCOPY  TRANSHIATAL TOTAL ESOPHAGEAL RESECTION FEEDING JEJUNOSTOMY   Patient s/p procedure 7/22: EXPLORATION OF LEFT NECK   Pt's diet advanced to Clear Liquids 8/10.  No N/V.  Vital AF 1.2 continues to infuse at goal rate of 70 ml/h providing 2016 kcals, 126 gm protein, 1361 ml of free water.  Tolerating well.    Diet Order:  Diet clear liquid Room service appropriate?: Yes; Fluid consistency:: Thin  Skin:  Wound (see comment) (Incision on abdomen, L neck, and wound on L knee)  Last BM:  8/8  Height:   Ht Readings from Last 1 Encounters:  05/24/15 5' 1"  (1.549 m)    Weight:   Wt Readings from Last 1 Encounters:  06/09/15 158 lb 3.2 oz (71.759 kg)    Ideal Body Weight:  50 kg  Wt Readings from Last 10 Encounters:  06/09/15 158 lb 3.2 oz (71.759 kg)  05/06/15 157 lb 11.2 oz (71.532 kg)  04/28/15 156 lb (70.761 kg)  04/04/15 156 lb (70.761 kg)  03/31/15 151 lb (68.493 kg)  03/30/15 151 lb (68.493 kg)  03/15/15 151 lb (68.493 kg)   11/30/14 157 lb 3 oz (71.3 kg)  09/16/14 151 lb (68.493 kg)  08/10/14 136 lb (61.689 kg)    BMI:  Body mass index is 29.91 kg/(m^2).  Estimated Nutritional Needs:   Kcal:  2000-2200  Protein:  110-120 gm  Fluid:  2.0-2.2 L  EDUCATION NEEDS:   No education needs identified at this time  Arthur Holms, RD, LDN Pager #: [phone] After-Hours Pager #: [phone]

## 2015-06-10 LAB — GLUCOSE, CAPILLARY
Glucose-Capillary: 140 mg/dL — ABNORMAL HIGH (ref 65–99)
Glucose-Capillary: 160 mg/dL — ABNORMAL HIGH (ref 65–99)
Glucose-Capillary: 176 mg/dL — ABNORMAL HIGH (ref 65–99)
Glucose-Capillary: 187 mg/dL — ABNORMAL HIGH (ref 65–99)
Glucose-Capillary: 195 mg/dL — ABNORMAL HIGH (ref 65–99)
Glucose-Capillary: 196 mg/dL — ABNORMAL HIGH (ref 65–99)
Glucose-Capillary: 260 mg/dL — ABNORMAL HIGH (ref 65–99)

## 2015-06-10 NOTE — Progress Notes (Signed)
Utilization review completed.  

## 2015-06-10 NOTE — Progress Notes (Addendum)
      [address]       [place] [address]             [phone]      21 Days Post-Op Procedure(s) (LRB): EXPLORATION OF LEFT NECK (Left)   Subjective:  Ms. Martone continues to have no complaints.  She is tolerating a clear liquid diet.    Objective: Vital signs in last 24 hours: Temp:  [98.3 F (36.8 C)-98.6 F (37 C)] 98.3 F (36.8 C) (08/12 0412) Pulse Rate:  [71-86] 76 (08/12 0412) Cardiac Rhythm:  [-] Normal sinus rhythm;Heart block (08/11 1930) Resp:  [20-22] 22 (08/12 0412) BP: (115-139)/(58-64) 115/63 mmHg (08/12 0412) SpO2:  [93 %-97 %] 93 % (08/12 0412) Weight:  [156 lb 8.4 oz (71 kg)] 156 lb 8.4 oz (71 kg) (08/12 0412)  Intake/Output from previous day: 08/11 0701 - 08/12 0700 In: 10 [I.V.:10] Out: 801 [Urine:800; Stool:1]  General appearance: alert, cooperative and no distress Heart: regular rate and rhythm Lungs: clear to auscultation bilaterally Abdomen: soft, non-tender; bowel sounds normal; no masses,  no organomegaly Wound: Neck wound there is drainage on dressing, however no active drainage present.... ABD wound healing well  Lab Results: No results for input(s): WBC, HGB, HCT, PLT in the last 72 hours. BMET: No results for input(s): NA, K, CL, CO2, GLUCOSE, BUN, CREATININE, CALCIUM in the last 72 hours.  PT/INR: No results for input(s): LABPROT, INR in the last 72 hours. ABG    Component Value Date/Time   PHART 7.353 05/11/2015 0356   HCO3 20.8 05/11/2015 0356   TCO2 22 05/11/2015 0356   ACIDBASEDEF 4.0* 05/11/2015 0356   O2SAT 94.0 05/11/2015 0356   CBG (last 3)   Recent Labs  06/09/15 2026 06/10/15 0007 06/10/15 0409  GLUCAP 155* 196* 140*    Assessment/Plan: S/P Procedure(s) (LRB): EXPLORATION OF LEFT NECK (Left)  1. Cervical Anastomotic Leak- may be almost resolved, no drainage from neck wound/some on dressing, will continue to monitor this weekend, if no further drainage may be able to repeat swallow study on  Monday 2. GI- continue tube feeds, tolerating clear diet 3. DM- sugars remain well controlled continue current insulin regimen 4. Dispo- hopefully leak is almost resolved, continue current care, if no further leak, possibly repeat swallow Monday   LOS: 31 days    Ellwood Handler 06/10/2015  Will plan repeat swallow before d/c I have seen and examined Casilda Carls and agree with the above assessment  and plan.  Grace Isaac MD Beeper [phone] Office [phone] 06/10/2015 9:39 AM

## 2015-06-10 NOTE — Care Management Note (Signed)
Case Management Note CM note started by [place]  Patient Details  Name: Sharon Price MRN: [id number] Date of Birth: [birth year]  Subjective/Objective:       Pt admitted with esophageal cancer             Action/Plan:  Pt is independent from home.  Pt was active with [facility] earlier this year for PT.  Pt is unsure which agency provided dme and tube feeds.  CM will monitor for disposition needs   Expected Discharge Date:                  Expected Discharge Plan:  [place]  In-House Referral:  Clinical Social Work  Discharge planning Services  CM Consult  Post Acute Care Choice:    Choice offered to:     DME Arranged:    DME Agency:     HH Arranged:    [place] Agency:     Status of Service:  In process, will continue to follow  Medicare Important Message Given:    Date Medicare IM Given:    Medicare IM give by:    Date Additional Medicare IM Given:    Additional Medicare Important Message give by:     If discussed at [place] of Stay Meetings, dates discussed:  06/07/15, 06/09/15  Additional Comments:  06/10/15- spoke with pt at bedside regarding d/c plans- per conversation pt states that MD had conversation with her that rehab might be that best plan for her at discharge- pt is agreeable to this plan as she will need TF at discharge along with wound care- pt reports that she has been at [facility] in [place] and [place] in past- CSW has been consulted for possible SNF placement- pt also states that she would like to have a rollator for home and would like to see if she can have it ordered here and delivered so that she will have it when she goes home from SNF- will request order from MD. Will continue to follow. Plan for repeat swallow study first of next week.   06/07/15- pt continues to have leak, on TF and sips/chips- NCM to continue to follow for d/c needs  CM assessed pt, pt stated she would like to use the  same agency used previously [facility].  Agency verified with CM that pt was discharged from services in March 2016.  Agency stated that tube feeds or IV meds will need to be set up with [facility], [place] can provide HH.  CM will continue to monitor for disposition needs.  Dawayne Patricia, RN 06/10/2015, 3:03 PM  Case Management Note  Patient Details  Name: Sharon Price MRN: [id number] Date of Birth: 17-Mar-1946  Subjective/Objective:                    Action/Plan:   Expected Discharge Date:                  Expected Discharge Plan:  Skilled Nursing Facility  In-House Referral:  Clinical Social Work  Discharge planning Services  CM Consult  Post Acute Care Choice:    Choice offered to:     DME Arranged:    DME Agency:     HH Arranged:    [place] Agency:     Status of Service:  In process, will continue to follow  Medicare Important Message Given:    Date Medicare IM Given:  Medicare IM give by:    Date Additional Medicare IM Given:    Additional Medicare Important Message give by:     If discussed at [place] of Stay Meetings, dates discussed:    Additional Comments:  Dawayne Patricia, RN 06/10/2015, 3:03 PM

## 2015-06-10 NOTE — Progress Notes (Signed)
Physical Therapy Treatment Patient Details Name: Sharon Price MRN: [id number] DOB: 04-17-1946 Today's Date: 06/10/2015    History of Present Illness Adm for surgical esophagectomy 7/12; return to OR 7/20 for cervical anastomosis leak; (pt s/p esophageal cancer and has completed chemotherapy/radiation. Her treatment course has been complicated with severe dehydration poor nutrition, requiring a PEG tube to be placed which became infected and had to be replaced several times. PMHx- DM, PE, CKD    PT Comments    Pt progressing towards physical therapy goals. Was able to perform transfers and ambulation with supervision and occasional min guard assist for safety with transfers/turns to sit on rollator. Will continue to follow and progress as able per POC.   Follow Up Recommendations  Home health PT;Supervision/Assistance - 24 hour     Equipment Recommendations  Other (comment) (Rollator)    Recommendations for Other Services       Precautions / Restrictions Precautions Precautions: Fall Precaution Comments: abdominal incision Restrictions Weight Bearing Restrictions: No    Mobility  Bed Mobility               General bed mobility comments: Pt sitting up in chair upon PT arrival.   Transfers Overall transfer level: Needs assistance Equipment used: 4-wheeled walker Transfers: Sit to/from [employer] Transfers Sit to Stand: Supervision Stand pivot transfers: Min guard       General transfer comment: Pt demonstrated proper hand placement and safety awareness during sit<>stand. Required use of body momentum to stand and able to boost up on second attempt.  Ambulation/Gait Ambulation/Gait assistance: Min guard;Supervision Ambulation Distance (Feet): 325 Feet Assistive device: 4-wheeled walker Gait Pattern/deviations: Step-through pattern;Decreased stride length;Trunk flexed Gait velocity: Decreased grossly, improved from previous session Gait velocity  interpretation: Below normal speed for age/gender General Gait Details: Grossly flexed posture. Verbal and manual cues for upright positioning; pt able to facilitate but not able to maintain. 2 seated rest breaks.    Stairs            Wheelchair Mobility    Modified Rankin (Stroke Patients Only)       Balance Overall balance assessment: Needs assistance Sitting-balance support: Feet supported;No upper extremity supported Sitting balance-Leahy Scale: Fair     Standing balance support: During functional activity Standing balance-Leahy Scale: Fair                      Cognition Arousal/Alertness: Awake/alert Behavior During Therapy: WFL for tasks assessed/performed Overall Cognitive Status: Within Functional Limits for tasks assessed                      Exercises      General Comments        Pertinent Vitals/Pain Pain Assessment: Faces Pain Score: 0-No pain    Home Living                      Prior Function            PT Goals (current goals can now be found in the care plan section) Acute Rehab PT Goals PT Goal Formulation: With patient Time For Goal Achievement: 06/17/15 Potential to Achieve Goals: Good Progress towards PT goals: Progressing toward goals    Frequency  Min 3X/week    PT Plan Current plan remains appropriate    Co-evaluation             End of Session Equipment Utilized During Treatment: Gait belt Activity Tolerance: Patient  tolerated treatment well Patient left: in chair;with call bell/phone within reach     Time: 1331-1403 PT Time Calculation (min) (ACUTE ONLY): 32 min  Charges:  $Gait Training: 23-37 mins                    G Codes:      Rolinda Roan 06-23-2015, 3:29 PM   Rolinda Roan, PT, DPT Acute Rehabilitation Services Pager: [phone]

## 2015-06-11 LAB — GLUCOSE, CAPILLARY
Glucose-Capillary: 169 mg/dL — ABNORMAL HIGH (ref 65–99)
Glucose-Capillary: 165 mg/dL — ABNORMAL HIGH (ref 65–99)
Glucose-Capillary: 201 mg/dL — ABNORMAL HIGH (ref 65–99)
Glucose-Capillary: 196 mg/dL — ABNORMAL HIGH (ref 65–99)
Glucose-Capillary: 165 mg/dL — ABNORMAL HIGH (ref 65–99)
Glucose-Capillary: 138 mg/dL — ABNORMAL HIGH (ref 65–99)

## 2015-06-11 LAB — CBC
HCT: 31.9 % — ABNORMAL LOW (ref 36.0–46.0)
Hemoglobin: 10.4 g/dL — ABNORMAL LOW (ref 12.0–15.0)
MCH: 28.7 pg (ref 26.0–34.0)
MCHC: 32.6 g/dL (ref 30.0–36.0)
MCV: 88.1 fL (ref 78.0–100.0)
Platelets: 358 10*3/uL (ref 150–400)
RBC: 3.62 MIL/uL — ABNORMAL LOW (ref 3.87–5.11)
RDW: 15 % (ref 11.5–15.5)
WBC: 9.9 10*3/uL (ref 4.0–10.5)

## 2015-06-11 LAB — COMPREHENSIVE METABOLIC PANEL
ALT: 57 U/L — ABNORMAL HIGH (ref 14–54)
AST: 60 U/L — ABNORMAL HIGH (ref 15–41)
Albumin: 2.2 g/dL — ABNORMAL LOW (ref 3.5–5.0)
Alkaline Phosphatase: 261 U/L — ABNORMAL HIGH (ref 38–126)
Anion gap: 10 (ref 5–15)
BUN: 25 mg/dL — ABNORMAL HIGH (ref 6–20)
CO2: 24 mmol/L (ref 22–32)
Calcium: 9.3 mg/dL (ref 8.9–10.3)
Chloride: 99 mmol/L — ABNORMAL LOW (ref 101–111)
Creatinine, Ser: 0.81 mg/dL (ref 0.44–1.00)
GFR calc Af Amer: >60 mL/min (ref >60)
GFR calc non Af Amer: >60 mL/min (ref >60)
Glucose, Bld: 173 mg/dL — ABNORMAL HIGH (ref 65–99)
Potassium: 4.4 mmol/L (ref 3.5–5.1)
Sodium: 133 mmol/L — ABNORMAL LOW (ref 135–145)
Total Bilirubin: 0.5 mg/dL (ref 0.3–1.2)
Total Protein: 6.2 g/dL — ABNORMAL LOW (ref 6.5–8.1)

## 2015-06-11 MED ORDER — OXYCODONE HCL 5 MG/5ML PO SOLN: 5.0000 mg | ORAL | Status: DC | PRN

## 2015-06-11 MED ORDER — PANTOPRAZOLE SODIUM 40 MG PO PACK
40.0000 mg | PACK | Freq: Two times a day (BID) | ORAL | Status: DC
  Filled 2015-06-11: qty 20

## 2015-06-11 MED ORDER — PANTOPRAZOLE SODIUM 40 MG PO PACK
40.0000 mg | PACK | Freq: Two times a day (BID) | ORAL | Status: DC
  Administered 2015-06-11 – 2015-06-17 (×13): 40 mg
  Filled 2015-06-11 (×17): qty 20

## 2015-06-11 MED ORDER — ACETAMINOPHEN 160 MG/5ML PO SOLN
325.0000 mg | ORAL | Status: DC | PRN
  Administered 2015-06-11: 325 mg via ORAL
  Filled 2015-06-11 (×2): qty 10.2

## 2015-06-11 NOTE — Progress Notes (Addendum)
      [address]       [place] [address]             [phone]      22 Days Post-Op Procedure(s) (LRB): EXPLORATION OF LEFT NECK (Left) Subjective: conts to slowly feel better  Objective: Vital signs in last 24 hours: Temp:  [97.8 F (36.6 C)-98.7 F (37.1 C)] 98 F (36.7 C) (08/13 0250) Pulse Rate:  [75-85] 85 (08/13 0250) Cardiac Rhythm:  [-] Normal sinus rhythm;Heart block (08/13 0820) Resp:  [18-24] 18 (08/13 0250) BP: (127-149)/(53-75) 145/75 mmHg (08/13 0250) SpO2:  [95 %-97 %] 95 % (08/13 0250) Weight:  [155 lb 13.8 oz (70.7 kg)] 155 lb 13.8 oz (70.7 kg) (08/13 0250)  Hemodynamic parameters for last 24 hours:    Intake/Output from previous day: 08/12 0701 - 08/13 0700 In: 1680 [P.O.:840; NG/GT:840] Out: 2475 [Urine:2475] Intake/Output this shift:    General appearance: alert, cooperative and no distress Heart: regular rate and rhythm and soft systoloc murmur Lungs: some scattered crackles Abdomen: soft Extremities: no edema Wound: less drainage from neck, abd incis healing well  Lab Results:  Recent Labs  06/11/15 0349  WBC 9.9  HGB 10.4*  HCT 31.9*  PLT 358   BMET:  Recent Labs  06/11/15 0349  NA 133*  K 4.4  CL 99*  CO2 24  GLUCOSE 173*  BUN 25*  CREATININE 0.81  CALCIUM 9.3    PT/INR: No results for input(s): LABPROT, INR in the last 72 hours. ABG    Component Value Date/Time   PHART 7.353 05/11/2015 0356   HCO3 20.8 05/11/2015 0356   TCO2 22 05/11/2015 0356   ACIDBASEDEF 4.0* 05/11/2015 0356   O2SAT 94.0 05/11/2015 0356   CBG (last 3)   Recent Labs  06/10/15 2336 06/11/15 0410 06/11/15 0738  GLUCAP 195* 169* 165*    Meds Scheduled Meds: . amiodarone  200 mg Oral BID  . antiseptic oral rinse  7 mL Mouth Rinse QID  . enoxaparin (LOVENOX) injection  30 mg Subcutaneous Q12H  . insulin aspart  0-24 Units Subcutaneous 6 times per day  . insulin detemir  25 Units Subcutaneous BID  . metoCLOPramide   10 mg Per Tube 4 times per day  . metoprolol tartrate  12.5 mg Per Tube BID  . pantoprazole (PROTONIX) IV  40 mg Intravenous Q12H  . sodium chloride  10 mL Intravenous Q12H   Continuous Infusions: . sodium chloride 10 mL/hr at 05/27/15 2200  . feeding supplement (VITAL AF 1.2 CAL) 1,000 mL (06/10/15 2347)  . lactated ringers 10 mL/hr at 05/23/15 0400   PRN Meds:.oxyCODONE **AND** acetaminophen, chlorpheniramine-HYDROcodone, diphenhydrAMINE, Daquane Aguilar's butt cream, hydrALAZINE, levalbuterol, lip balm, morphine injection, potassium chloride, sodium chloride  Xrays No results found.  Assessment/Plan: S/P Procedure(s) (LRB): EXPLORATION OF LEFT NECK (Left)  1 stable with current management for cervical anast leak, 2 conts current management   LOS: 32 days    GOLD,WAYNE E 06/11/2015  Taking liquids po small amount, likely will need to go to [facility] on tube feeding Convert to xarleto before d/c when sure can take po pill without difficulty I have seen and examined Casilda Carls and agree with the above assessment  and plan.  Grace Isaac MD Beeper [phone] Office [phone] 06/11/2015 10:42 AM

## 2015-06-11 NOTE — Care Management Note (Signed)
Case Management Note  Patient Details  Name: Sharon Price MRN: [id number] Date of Birth: 11-Mar-1946  Subjective/Objective:                  Pt admitted with esophageal cancer  Action/Plan: CM spoke to patient at the bedside who states that she is planning to go to [facility] if possible after discharge. Per MD note, patient is for SNF placement. CM called and spoke with Tywan with SW @ [phone] and advised of d/c plan to SNF. Consult accepted. CM available for further D/C needs. Pt states that she has her daughter and granddaughters at home to help once she is discharged home from SNF.   Expected Discharge Date:                 Expected Discharge Plan:  Skilled Nursing Facility  In-House Referral:  Clinical Social Work  Discharge planning Services  CM Consult  Post Acute Care Choice:    Choice offered to:     DME Arranged:    DME Agency:     HH Arranged:    [place] Agency:     Status of Service:  In process, will continue to follow  Medicare Important Message Given:    Date Medicare IM Given:    Medicare IM give by:    Date Additional Medicare IM Given:    Additional Medicare Important Message give by:     If discussed at [place] of Stay Meetings, dates discussed:    Additional Comments:  Guido Sander, RN 06/11/2015, 4:07 PM

## 2015-06-12 LAB — GLUCOSE, CAPILLARY
Glucose-Capillary: 176 mg/dL — ABNORMAL HIGH (ref 65–99)
Glucose-Capillary: 164 mg/dL — ABNORMAL HIGH (ref 65–99)
Glucose-Capillary: 239 mg/dL — ABNORMAL HIGH (ref 65–99)
Glucose-Capillary: 153 mg/dL — ABNORMAL HIGH (ref 65–99)
Glucose-Capillary: 189 mg/dL — ABNORMAL HIGH (ref 65–99)
Glucose-Capillary: 206 mg/dL — ABNORMAL HIGH (ref 65–99)

## 2015-06-12 NOTE — Care Management Note (Signed)
Case Management Note  Patient Details  Name: Sharon Price MRN: [id number] Date of Birth: 05-28-1946  Subjective/Objective:                  Esopageal cancer  Action/Plan: CM spoke to the patient at the bedside. Pt states that she now feels okay to go home with [facility] services and is feeling stronger today. Pt states that she has a shower chair and wheelchair but does need a rollator. CM offered choice for DME and [facility]. CM spoke to pt about hh services and pt chose [facility] as she has been active with them before. CM to call and arrange [facility] and DME on 06/13/15. Pt will need HH PT, RN, and aide per patient. Patient does not feel OT is necessary. She also says that she has a tube feeding pump but no supplies or Tube feeding. Pt states that she will have 24 hour assistance with her family in shifts but states that they are agreeable per the pt. Pt has 4 steps to get into house and then otherwise one level home. Pt states that she feels safe returning home.  Pt denies further needs at this time and CM will continue to monitor for discharge planning needs.    Expected Discharge Date:                  Expected Discharge Plan:  [place]  In-House Referral:  Clinical Social Work  Discharge planning Services  CM Consult  Post Acute Care Choice:  Home Health, Durable Medical Equipment Choice offered to:  Patient  DME Arranged:    DME Agency:  [place]:    [place]:  [place]  Status of Service:  In process, will continue to follow  Medicare Important Message Given:    Date Medicare IM Given:    Medicare IM give by:    Date Additional Medicare IM Given:    Additional Medicare Important Message give by:     If discussed at [place] of Stay Meetings, dates discussed:    Additional Comments:  Guido Sander, RN 06/12/2015, 5:34 PM

## 2015-06-12 NOTE — Progress Notes (Signed)
LCSW discussed case with care management Vicente Males with regards from note discussing SNF. Patient is walking 375ft reported by PT note on 8/12.  Due to insurance needing prior authorization, patient is not going to qualify as she is walking 325 feet.  CM called and notified of potential barrier and CM going to speak with patient and identify any other qualifying markers for SNF placement.  If patient does not qualify (supervision is not something that will qualify) patient will need HH arranged and family involvement for disposition.  SW will sign off for now, however in the event patient meets qualifiers for skilled therapy at [facility], LCSW will complete referrals and work patient up.  Lane Hacker, MSW Clinical Social Work: Emergency Room [phone]

## 2015-06-12 NOTE — Progress Notes (Addendum)
Per pharmacy Xarelto cannot be given as a suspension. Can be crushed in 50 mL of water for administration through J-tube. Per pt this is what was prescribed last time she had a J-tube. Alternative, per pharmacy, would be full dose Lovenox.  Fritz Pickerel, RN 06/12/2015 11:10 AM

## 2015-06-12 NOTE — Progress Notes (Addendum)
      [address]       [place] [address]             [phone]      23 Days Post-Op Procedure(s) (LRB): EXPLORATION OF LEFT NECK (Left) Subjective: Feels ok overall  Objective: Vital signs in last 24 hours: Temp:  [98.3 F (36.8 C)-98.7 F (37.1 C)] 98.6 F (37 C) (08/14 0341) Pulse Rate:  [82-88] 88 (08/14 0341) Cardiac Rhythm:  [-] Heart block;Normal sinus rhythm (08/14 0730) Resp:  [18-24] 18 (08/14 0341) BP: (132-143)/(54-79) 143/79 mmHg (08/14 0341) SpO2:  [93 %-94 %] 94 % (08/14 0341) Weight:  [156 lb 9.6 oz (71.033 kg)] 156 lb 9.6 oz (71.033 kg) (08/14 0341)  Hemodynamic parameters for last 24 hours:    Intake/Output from previous day: 08/13 0701 - 08/14 0700 In: 200 [P.O.:200] Out: 1400 [Urine:1400] Intake/Output this shift: Total I/O In: 120 [P.O.:120] Out: -   General appearance: alert, cooperative, fatigued and no distress Heart: regular rate and rhythm Lungs: some diffuse crackles Abdomen: soft, nontender, non distended Extremities: no edema Wound: neck still with mucous drainage, abd incis healing well  Lab Results:  Recent Labs  06/11/15 0349  WBC 9.9  HGB 10.4*  HCT 31.9*  PLT 358   BMET:  Recent Labs  06/11/15 0349  NA 133*  K 4.4  CL 99*  CO2 24  GLUCOSE 173*  BUN 25*  CREATININE 0.81  CALCIUM 9.3    PT/INR: No results for input(s): LABPROT, INR in the last 72 hours. ABG    Component Value Date/Time   PHART 7.353 05/11/2015 0356   HCO3 20.8 05/11/2015 0356   TCO2 22 05/11/2015 0356   ACIDBASEDEF 4.0* 05/11/2015 0356   O2SAT 94.0 05/11/2015 0356   CBG (last 3)   Recent Labs  06/11/15 2324 06/12/15 0349 06/12/15 0753  GLUCAP 138* 176* 164*    Meds Scheduled Meds: . amiodarone  200 mg Oral BID  . antiseptic oral rinse  7 mL Mouth Rinse QID  . enoxaparin (LOVENOX) injection  30 mg Subcutaneous Q12H  . insulin aspart  0-24 Units Subcutaneous 6 times per day  . insulin detemir  25 Units  Subcutaneous BID  . metoCLOPramide  10 mg Per Tube 4 times per day  . metoprolol tartrate  12.5 mg Per Tube BID  . pantoprazole sodium  40 mg Per Tube BID  . sodium chloride  10 mL Intravenous Q12H   Continuous Infusions: . sodium chloride 10 mL/hr at 05/27/15 2200  . feeding supplement (VITAL AF 1.2 CAL) 1,000 mL (06/12/15 0532)  . lactated ringers 10 mL/hr at 05/23/15 0400   PRN Meds:.acetaminophen (TYLENOL) oral liquid 160 mg/5 mL, chlorpheniramine-HYDROcodone, diphenhydrAMINE, Larinda Herter's butt cream, hydrALAZINE, levalbuterol, lip balm, morphine injection, oxyCODONE, potassium chloride, sodium chloride  Xrays No results found.  Assessment/Plan: S/P Procedure(s) (LRB): EXPLORATION OF LEFT NECK (Left)  1 doing ok, hemodyn stable 2 poss swallow study soon 3 sugars controlled   LOS: 33 days    GOLD,WAYNE E 06/12/2015  Taking full liquids ok Still small amount of drainage Will start xarelto per j tube  If pharamacy can supply as complete suspension and dc lovenox I have seen and examined Sharon Price and agree with the above assessment  and plan.  Grace Isaac MD Beeper [phone] Office [phone] 06/12/2015 10:43 AM

## 2015-06-13 LAB — GLUCOSE, CAPILLARY
Glucose-Capillary: 172 mg/dL — ABNORMAL HIGH (ref 65–99)
Glucose-Capillary: 219 mg/dL — ABNORMAL HIGH (ref 65–99)
Glucose-Capillary: 181 mg/dL — ABNORMAL HIGH (ref 65–99)
Glucose-Capillary: 177 mg/dL — ABNORMAL HIGH (ref 65–99)
Glucose-Capillary: 200 mg/dL — ABNORMAL HIGH (ref 65–99)

## 2015-06-13 MED ORDER — RIVAROXABAN 20 MG PO TABS
20.0000 mg | ORAL_TABLET | Freq: Every day | ORAL | Status: DC
  Filled 2015-06-13: qty 1

## 2015-06-13 NOTE — Clinical Social Work Placement (Signed)
   CLINICAL SOCIAL WORK PLACEMENT  NOTE  Date:  06/13/2015  Patient Details  Name: DINAH LUPA MRN: [id number] Date of Birth: July 06, 1946  Clinical Social Work is seeking post-discharge placement for this patient at the [place] level of care (*CSW will initial, date and re-position this form in  chart as items are completed):  Yes   Patient/family provided with [place] Work Department's list of facilities offering this level of care within the geographic area requested by the patient (or if unable, by the patient's family).  Yes   Patient/family informed of their freedom to choose among providers that offer the needed level of care, that participate in Medicare, Medicaid or managed care program needed by the patient, have an available bed and are willing to accept the patient.  Yes   Patient/family informed of [place]'s ownership interest in [facility] and [facility], as well as of the fact that they are under no obligation to receive care at these facilities.  PASRR submitted to EDS on 06/13/15     PASRR number received on 06/13/15     Existing PASRR number confirmed on       FL2 transmitted to all facilities in geographic area requested by pt/family on 06/13/15     FL2 transmitted to all facilities within larger geographic area on       Patient informed that his/her managed care company has contracts with or will negotiate with certain facilities, including the following:            Patient/family informed of bed offers received.  Patient chooses bed at       Physician recommends and patient chooses bed at      Patient to be transferred to   on  .  Patient to be transferred to facility by       Patient family notified on   of transfer.  Name of family member notified:        PHYSICIAN Please sign FL2     Additional Comment:    _______________________________________________ [place] Mon, LCSW 06/13/2015,  1:14 PM

## 2015-06-13 NOTE — Progress Notes (Signed)
ANTICOAGULATION CONSULT NOTE - Initial Consult  Pharmacy Consult for Xarelto Indication: history of pulmonary embolus   No Known Allergies  Patient Measurements: Height: 5\' 1"  (154.9 cm) Weight: 155 lb 11.2 oz (70.625 kg) IBW/kg (Calculated) : 47.8   Vital Signs: Temp: 98.7 F (37.1 C) (08/15 0437) Temp Source: Oral (08/15 0437) BP: 144/71 mmHg (08/15 0437) Pulse Rate: 87 (08/15 0437)  Labs:  Recent Labs  06/11/15 0349  HGB 10.4*  HCT 31.9*  PLT 358  CREATININE 0.81    Estimated Creatinine Clearance: 59.7 mL/min (by C-G formula based on Cr of 0.81).   Medical History: Past Medical History  Diagnosis Date  . Chronic kidney disease     renal..STAGE 2  . Hypertension   . Mechanical dysphagia     MOSTLY SOLIDS  . Pulmonary emboli 9/223/15 [place]    RIGHT LOWER LOBE PER CT CHEST   . Patient on combined chemotherapy and radiation     [facility]  . Cancer 03/30/14    GE JUNCTION  . Transfusion history     ?[facility].  . Family history of adverse reaction to anesthesia     Patients sister is very nauseated after anesthesia  . Pneumonia   . Diabetes mellitus without complication     Type 2  . Hyperlipemia   . GERD (gastroesophageal reflux disease)     Assessment: Sharon Price is a 69 y.o. female who has a history of PE on PTA Xarelto. Patient to transition back to Xarelto. Of note, patient has a J Tube per many notes and Xarelto CANNOT be administered through a J tube due to poor absorption. However, other notes report PEG tube with which Xarelto can be crushed and suspended in 50 mL of water for administration. Patient did this prior to admission with no known events. H&H 10.4/31/9, platelets 358. Current CrCl 59.7 ml/min.    Goal of Therapy:  - Prevention of clots   Plan:  - Restart Xarelto (rivaroxaban) 20 mg daily with supper. Please crush the pill and suspend in 50 mL water IF patient has a  PEG tube. If not, will have to continue Lovenox.  - Monitor for s/sx of bleeding  - Monitor renal function    Nicoletta Ba, PharmD, BCPS Clinical Pharmacist [phone]

## 2015-06-13 NOTE — Progress Notes (Signed)
ANTICOAGULATION CONSULT NOTE - Initial Consult  Pharmacy Consult for Xarelto Indication: history of pulmonary embolus   No Known Allergies  Patient Measurements: Height: 5\' 1"  (154.9 cm) Weight: 155 lb 11.2 oz (70.625 kg) IBW/kg (Calculated) : 47.8   Vital Signs: Temp: 98.7 F (37.1 C) (08/15 0437) Temp Source: Oral (08/15 0437) BP: 144/71 mmHg (08/15 0437) Pulse Rate: 87 (08/15 0437)  Labs:  Recent Labs  06/11/15 0349  HGB 10.4*  HCT 31.9*  PLT 358  CREATININE 0.81    Estimated Creatinine Clearance: 59.7 mL/min (by C-G formula based on Cr of 0.81).   Medical History: Past Medical History  Diagnosis Date  . Chronic kidney disease     renal..STAGE 2  . Hypertension   . Mechanical dysphagia     MOSTLY SOLIDS  . Pulmonary emboli 9/223/15 [place]    RIGHT LOWER LOBE PER CT CHEST   . Patient on combined chemotherapy and radiation     [facility]  . Cancer 03/30/14    GE JUNCTION  . Transfusion history     ?[facility].  . Family history of adverse reaction to anesthesia     Patients sister is very nauseated after anesthesia  . Pneumonia   . Diabetes mellitus without complication     Type 2  . Hyperlipemia   . GERD (gastroesophageal reflux disease)     Assessment: Sharon Price is a 69 y.o. female who has a history of PE on PTA Xarelto. Patient to transition back to Xarelto. Patient has a J Tube (confirmed) and Xarelto CANNOT be administered through a J tube due to poor absorption.  Nurse aware. H&H 10.4/31/9, platelets 358. Current CrCl 59.7 ml/min.    Goal of Therapy:  - Prevention of clots   Plan:  - Resume Lovenox (has already been ordered) - Monitor for s/sx of bleeding  - Monitor renal function    Nicoletta Ba, PharmD, BCPS Clinical Pharmacist [phone]

## 2015-06-13 NOTE — Clinical Social Work Note (Signed)
Clinical Social Work Assessment  Patient Details  Name: Sharon Price MRN: [id number] Date of Birth: 09-27-1946  Date of referral:  06/13/15               Reason for consult:  Facility Placement                Permission sought to share information with:  Chartered certified accountant granted to share information::  Yes, Verbal Permission Granted  Name::        Agency::  [facility]  Relationship::     Contact Information:     Housing/Transportation Living arrangements for the past 2 months:  Single Family Home Source of Information:  Patient Patient Interpreter Needed:  None Criminal Activity/Legal Involvement Pertinent to Current Situation/Hospitalization:  No - Comment as needed Significant Relationships:  Adult Children Lives with:    Do you feel safe going back to the place where you live?  Yes Need for family participation in patient care:  No (Coment)  Care giving concerns: pt lives alone and reports that she would not have 24 hours supervision as the doctor is recommending- would also have continued needs for wound care/ tube feedings   Social Worker assessment / plan:  CSW spoke with pt about Dr recommendation for SNF  Employment status:    Insurance information:  Information systems manager, Managed Care PT Recommendations:  Home with [place], 24 Hour Supervision Information / Referral to community resources:  [place]  Patient/Family's Response to care:  Pt agreeable to whatever the doctor feels as best- would prefer to go home  Patient/Family's Understanding of and Emotional Response to Diagnosis, Current Treatment, and Prognosis: Pt does not have any questions or concerns at this time- is hopeful she will be able to go home soon  Emotional Assessment Appearance:  Appears stated age Attitude/Demeanor/Rapport:    Affect (typically observed):  Accepting, Appropriate Orientation:  Oriented to Self, Oriented to Place, Oriented to  Time,  Oriented to Situation Alcohol / Substance use:  Not Applicable Psych involvement (Current and /or in the community):  No (Comment)  Discharge Needs  Concerns to be addressed:  Care Coordination Readmission within the last 30 days:  No Current discharge risk:  Lack of support system Barriers to Discharge:  Continued Medical Work up   [employer], [place] M, LCSW 06/13/2015, 1:10 PM

## 2015-06-13 NOTE — Progress Notes (Signed)
Physical Therapy Treatment Patient Details Name: Sharon Price MRN: [id number] DOB: 1946-06-03 Today's Date: 06/13/2015    History of Present Illness Adm for surgical esophagectomy 7/12; return to OR 7/20 for cervical anastomosis leak; (pt s/p esophageal cancer and has completed chemotherapy/radiation. Her treatment course has been complicated with severe dehydration poor nutrition, requiring a PEG tube to be placed which became infected and had to be replaced several times. PMHx- DM, PE, CKD    PT Comments    Patient at risk for falls and has limited activity tolerance.  Agree with plans for SNF rehab due to patient lives alone.  Progressing slowly towards goals.  Will continue to follow acutely.  Follow Up Recommendations  SNF     Equipment Recommendations  Other (comment) (TBA)    Recommendations for Other Services       Precautions / Restrictions Precautions Precautions: Fall Precaution Comments: abdominal incision    Mobility  Bed Mobility   Bed Mobility: Sit to Supine         Sit to sidelying: Supervision General bed mobility comments: initially sitting EOB with lunch tray; returned to supine after ambulation  Transfers Overall transfer level: Needs assistance Equipment used: 4-wheeled walker   Sit to Stand: Supervision         General transfer comment: increased time and UE support needed for sit to stand  Ambulation/Gait Ambulation/Gait assistance: Supervision Ambulation Distance (Feet): 350 Feet (with one seated rest break) Assistive device: 4-wheeled walker Gait Pattern/deviations: Step-through pattern;Trunk flexed;Decreased stride length     General Gait Details: one seated rest during ambulation; maintained flexed posture, decreased stride length and pt at risk for falls due to tripping   Stairs            Wheelchair Mobility    Modified Rankin (Stroke Patients Only)       Balance Overall balance assessment: Needs assistance    Sitting balance-Leahy Scale: Good     Standing balance support: Bilateral upper extremity supported Standing balance-Leahy Scale: Poor Standing balance comment: reliant on UE assist for balance                    Cognition Arousal/Alertness: Awake/alert Behavior During Therapy: WFL for tasks assessed/performed Overall Cognitive Status: Within Functional Limits for tasks assessed                      Exercises      General Comments        Pertinent Vitals/Pain Pain Assessment: Faces Faces Pain Scale: Hurts a little bit Pain Location: abdomen Pain Descriptors / Indicators: Operative site guarding Pain Intervention(s): Monitored during session    Home Living                      Prior Function            PT Goals (current goals can now be found in the care plan section) Progress towards PT goals: Progressing toward goals (slowly)    Frequency  Min 3X/week    PT Plan Discharge plan needs to be updated    Co-evaluation             End of Session Equipment Utilized During Treatment: Gait belt Activity Tolerance: Patient limited by fatigue Patient left: in bed;with call bell/phone within reach     Time: 1201-1222 PT Time Calculation (min) (ACUTE ONLY): 21 min  Charges:  $Gait Training: 8-22 mins  G Codes:      WYNN,CYNDI 06/13/2015, 1:25 PM  Magda Kiel, [place] 06/13/2015

## 2015-06-13 NOTE — Progress Notes (Addendum)
      [address]       [place] [address]             [phone]      24 Days Post-Op Procedure(s) (LRB): EXPLORATION OF LEFT NECK (Left)  Subjective:  Sharon Price has no new complaints this morning.  She is hoping she can stop Lovenox and restart Xarelto.    Objective: Vital signs in last 24 hours: Temp:  [98.4 F (36.9 C)-98.7 F (37.1 C)] 98.7 F (37.1 C) (08/15 0437) Pulse Rate:  [81-92] 87 (08/15 0437) Cardiac Rhythm:  [-]  Resp:  [18-19] 18 (08/15 0437) BP: (124-144)/(60-71) 144/71 mmHg (08/15 0437) SpO2:  [93 %-96 %] 96 % (08/15 0437) Weight:  [155 lb 11.2 oz (70.625 kg)] 155 lb 11.2 oz (70.625 kg) (08/15 0437)  Intake/Output from previous day: 08/14 0701 - 08/15 0700 In: 240 [P.O.:240] Out: 1301 [Urine:1300; Stool:1] Intake/Output this shift: Total I/O In: -  Out: 200 [Urine:200]  General appearance: alert, cooperative and no distress Heart: regular rate and rhythm Lungs: clear to auscultation bilaterally Abdomen: soft, non-tender; bowel sounds normal; no masses,  no organomegaly Wound: neck wound + drainage, abd clean and dyr  Lab Results:  Recent Labs  06/11/15 0349  WBC 9.9  HGB 10.4*  HCT 31.9*  PLT 358   BMET:  Recent Labs  06/11/15 0349  NA 133*  K 4.4  CL 99*  CO2 24  GLUCOSE 173*  BUN 25*  CREATININE 0.81  CALCIUM 9.3    PT/INR: No results for input(s): LABPROT, INR in the last 72 hours. ABG    Component Value Date/Time   PHART 7.353 05/11/2015 0356   HCO3 20.8 05/11/2015 0356   TCO2 22 05/11/2015 0356   ACIDBASEDEF 4.0* 05/11/2015 0356   O2SAT 94.0 05/11/2015 0356   CBG (last 3)   Recent Labs  06/12/15 2017 06/12/15 2307 06/13/15 0431  GLUCAP 189* 206* 172*    Assessment/Plan: S/P Procedure(s) (LRB): EXPLORATION OF LEFT NECK (Left)  1. Cervical Anastomotic Leak- drainage is improving, continue dressing changes 2. GI- continue tube feeds, tolerating clear diet 3. H/O PE- would like to resume  Xarelto, states she crushed pill before and it was administered through J-Tube with water, however, no solution is available will defer to Dr. Servando Snare 4. Dispo- patient stable, continue current care, swallow once drainage stops   LOS: 34 days    BARRETT, ERIN 06/13/2015 Neck drainage almost stopped  Convert to xarleto per tube Plan to go to [facility] for snf rehab and tube feeding via j tube I have seen and examined Sharon Price and agree with the above assessment  and plan.  Grace Isaac MD Beeper [phone] Office [phone] 06/13/2015 8:59 AM

## 2015-06-13 NOTE — Progress Notes (Signed)
CSW informed that pt will need SNF placement due to medical needs- weakness, wound care, etc- CSW spoke with pt who is agreeable- faxed to [facility].  CSW discussed insurance auth as potential barrier to placement with pt- pt would like to go home but wants to do what is best for her.  Full assessment to follow.  Domenica Reamer, [place] Social Worker [phone]

## 2015-06-14 LAB — GLUCOSE, CAPILLARY
Glucose-Capillary: 120 mg/dL — ABNORMAL HIGH (ref 65–99)
Glucose-Capillary: 178 mg/dL — ABNORMAL HIGH (ref 65–99)
Glucose-Capillary: 188 mg/dL — ABNORMAL HIGH (ref 65–99)
Glucose-Capillary: 207 mg/dL — ABNORMAL HIGH (ref 65–99)
Glucose-Capillary: 215 mg/dL — ABNORMAL HIGH (ref 65–99)
Glucose-Capillary: 237 mg/dL — ABNORMAL HIGH (ref 65–99)

## 2015-06-14 NOTE — Progress Notes (Signed)
Utilization review completed.  

## 2015-06-14 NOTE — Progress Notes (Signed)
Inpatient Diabetes Program Recommendations  AACE/ADA: New Consensus Statement on Inpatient Glycemic Control (2013)  Target Ranges:  Prepandial:   less than 140 mg/dL      Peak postprandial:   less than 180 mg/dL (1-2 hours)      Critically ill patients:  140 - 180 mg/dL   Results for JESSICE, MADILL (MRN [id number]) as of 06/14/2015 08:59  Ref. Range 06/13/2015 08:03 06/13/2015 11:47 06/13/2015 17:07 06/13/2015 20:22 06/14/2015 00:13 06/14/2015 04:40 06/14/2015 08:07  Glucose-Capillary Latest Ref Range: 65-99 mg/dL 219 (H) 181 (H) 177 (H) 200 (H) 215 (H) 120 (H) 207 (H)   Diabetes history: DM 2 Outpatient Diabetes medications: Lantus 20 QHS, Metformin 500 mg BID Current orders for Inpatient glycemic control: Lantus 25 units BID, Novolog 0-24 units Q4hrs  Inpatient Diabetes Program Recommendations Insulin - Basal: Gluocse still going into the 200's. Please consider increasing basal insulin to Lantus 28 units BID.  Thanks,  Tama Headings RN, MSN, [facility] Inpatient Diabetes Coordinator Team Pager [phone] (8am-5pm)

## 2015-06-14 NOTE — Progress Notes (Addendum)
      [address]       [place] [address]             [phone]      25 Days Post-Op Procedure(s) (LRB): EXPLORATION OF LEFT NECK (Left)   Subjective:  No new complaints.    Objective: Vital signs in last 24 hours: Temp:  [98.3 F (36.8 C)-99.1 F (37.3 C)] 98.3 F (36.8 C) (08/16 0442) Pulse Rate:  [79-85] 79 (08/16 0442) Cardiac Rhythm:  [-] Normal sinus rhythm (08/15 1950) Resp:  [17-18] 17 (08/16 0442) BP: (113-133)/(57-62) 113/59 mmHg (08/16 0442) SpO2:  [93 %-97 %] 93 % (08/16 0442) Weight:  [156 lb 15.5 oz (71.2 kg)] 156 lb 15.5 oz (71.2 kg) (08/16 0442)  Intake/Output from previous day: 08/15 0701 - 08/16 0700 In: 990 [P.O.:90; NG/GT:840] Out: 1300 [Urine:1300]  General appearance: alert, cooperative and no distress Heart: regular rate and rhythm Lungs: clear to auscultation bilaterally Abdomen: soft, non-tender; bowel sounds normal; no masses,  no organomegaly Wound: minimal drainage from neck wound, ABD clean and dry  Lab Results: No results for input(s): WBC, HGB, HCT, PLT in the last 72 hours. BMET: No results for input(s): NA, K, CL, CO2, GLUCOSE, BUN, CREATININE, CALCIUM in the last 72 hours.  PT/INR: No results for input(s): LABPROT, INR in the last 72 hours. ABG    Component Value Date/Time   PHART 7.353 05/11/2015 0356   HCO3 20.8 05/11/2015 0356   TCO2 22 05/11/2015 0356   ACIDBASEDEF 4.0* 05/11/2015 0356   O2SAT 94.0 05/11/2015 0356   CBG (last 3)   Recent Labs  06/13/15 2022 06/14/15 0013 06/14/15 0440  GLUCAP 200* 215* 120*    Assessment/Plan: S/P Procedure(s) (LRB): EXPLORATION OF LEFT NECK (Left)  1. Cervical Anastomotic Leak- neck drainage is minimal, hopefully can have swallow study soon 2. 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 GI- continue tube feeds, tolerating full liquid diet 4. Dispo- patient stable, on Lovenox for H/O PE, continue tube feeds, hopefully can  repeat swallow study soon   LOS: 35 days    Price, Sharon 06/14/2015  Almost no leakage now, eating soup wthout difficulty Poss d/c to home by Friday Swallow before leaving , Thursday Will investigate xarleto contraindication in J tube vs swallowing in stomach that does not make acid. I have seen and examined Sharon Price and agree with the above assessment  and plan.  Grace Isaac MD Beeper [phone] Office [phone] 06/14/2015 6:52 PM

## 2015-06-14 NOTE — Progress Notes (Signed)
CSW heard from [place] will not approve SNF day due to high level of mobility.  CSW informed RNCM.  CSW signing off  Domenica Reamer, [place] Social Worker [phone]

## 2015-06-15 LAB — GLUCOSE, CAPILLARY
Glucose-Capillary: 148 mg/dL — ABNORMAL HIGH (ref 65–99)
Glucose-Capillary: 153 mg/dL — ABNORMAL HIGH (ref 65–99)
Glucose-Capillary: 170 mg/dL — ABNORMAL HIGH (ref 65–99)
Glucose-Capillary: 191 mg/dL — ABNORMAL HIGH (ref 65–99)
Glucose-Capillary: 223 mg/dL — ABNORMAL HIGH (ref 65–99)
Glucose-Capillary: 242 mg/dL — ABNORMAL HIGH (ref 65–99)

## 2015-06-15 NOTE — Care Management Note (Signed)
Case Management Note CM note started by [place]  Patient Details  Name: Sharon Price MRN: [id number] Date of Birth: [birth year]  Subjective/Objective:       Pt admitted with esophageal cancer             Action/Plan:  Pt is independent from home.  Pt was active with [facility] earlier this year for PT.  Pt is unsure which agency provided dme and tube feeds.  CM will monitor for disposition needs   Expected Discharge Date:                  Expected Discharge Plan:  [place]  In-House Referral:  Clinical Social Work  Discharge planning Services  CM Consult  Post Acute Care Choice:  Home Health, Durable Medical Equipment Choice offered to:  Patient  DME Arranged:    DME Agency:  [place]:    [place]:  [place]  Status of Service:  In process, will continue to follow  Medicare Important Message Given:    Date Medicare IM Given:    Medicare IM give by:    Date Additional Medicare IM Given:    Additional Medicare Important Message give by:     If discussed at [place] of Stay Meetings, dates discussed:  06/07/15, 06/09/15, 06/14/15, 06/16/15  Additional Comments:  06/15/15- per CSW pt did not receive approval for SNF - will need to d/c home with [facility] at discharge- spoke with pt at bedside regarding this plan- pt is ok with discharge to home with [facility] and states that she will have plenty of help at home- pt reports that she still has TF pump with pole at home- will need orders for TF type and rate instructions for home- also will need orders for HH-RN/PT/OT/aide with F2F. Per pt plan is for possible repeat swallow study tomorrow. NCM to continue to follow to assist with d/c needs.    06/10/15- spoke with pt at bedside regarding d/c plans- per conversation pt states that MD had conversation with her that rehab might be that best plan for her at discharge- pt is agreeable to this plan as  she will need TF at discharge along with wound care- pt reports that she has been at [facility] in [place] and [place] in past- CSW has been consulted for possible SNF placement- pt also states that she would like to have a rollator for home and would like to see if she can have it ordered here and delivered so that she will have it when she goes home from SNF- will request order from MD. Will continue to follow. Plan for repeat swallow study first of next week.   06/07/15- pt continues to have leak, on TF and sips/chips- NCM to continue to follow for d/c needs  CM assessed pt, pt stated she would like to use the same agency used previously [facility].  Agency verified with CM that pt was discharged from services in March 2016.  Agency stated that tube feeds or IV meds will need to be set up with [facility], [place] can provide HH.  CM will continue to monitor for disposition needs.  Dawayne Patricia, RN 06/15/2015, 10:15 AM  Case Management Note  Patient Details  Name: Sharon Price MRN: [id number] Date of Birth: 1945/11/29  Subjective/Objective:  Action/Plan:   Expected Discharge Date:                  Expected Discharge Plan:  [place]  In-House Referral:  Clinical Social Work  Discharge planning Services  CM Consult  Post Acute Care Choice:  Home Health, Durable Medical Equipment Choice offered to:  Patient  DME Arranged:    DME Agency:  [place]:    [facility] Agency:  [place]  Status of Service:  In process, will continue to follow  Medicare Important Message Given:    Date Medicare IM Given:    Medicare IM give by:    Date Additional Medicare IM Given:    Additional Medicare Important Message give by:     If discussed at [place] of Stay Meetings, dates discussed:    Additional Comments:  Dawayne Patricia, RN 06/15/2015, 10:15 AM

## 2015-06-15 NOTE — Progress Notes (Signed)
Nutrition Follow Up  DOCUMENTATION CODES:   Not applicable  INTERVENTION:    Continue Vital AF 1.2 formula at goal rate of 70 ml/hr   NEW NUTRITION DIAGNOSIS:   Increased nutrient needs related to wound healing as evidenced by estimated needs, ongoing  GOAL:   Patient will meet greater than or equal to 90% of their needs, met  MONITOR:   TF tolerance, PO intake, Labs, I & O's  ASSESSMENT:  69 y.o. 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 The patient has completed neoadjuvant chemotherapy radiation with weekly carboplatin and and paclltaxel in AUG 2015. Her treatment course has been complicated with severe dehydration poor nutrition, requiring a PEG tube to be placed which becameinfected and had to be replaced several times. The patient needed supplementary IV fluids through a PICC line.   Patient s/p procedures 7/12: VIDEO BRONCHOSCOPY  TRANSHIATAL TOTAL ESOPHAGEAL RESECTION FEEDING JEJUNOSTOMY   Patient s/p procedure 7/22: EXPLORATION OF LEFT NECK   Pt's diet advanced to Full Liquids 8/13.  PO intake 10% per flowsheet records.  Vital AF 1.2 continues to infuse at goal rate of 70 ml/h providing 2016 kcals, 126 gm protein, 1361 ml of free water.  Tolerating well.    Repeat swallow in AM.  Possible discharge.  Diet Order:  Diet full liquid Room service appropriate?: Yes; Fluid consistency:: Thin  Skin:  Wound (see comment) (Incision on abdomen, L neck, and wound on L knee)  Last BM:  8/16  Height:   Ht Readings from Last 1 Encounters:  05/24/15 5' 1"  (1.549 m)    Weight:   Wt Readings from Last 1 Encounters:  06/15/15 157 lb 10.1 oz (71.5 kg)    Ideal Body Weight:  50 kg  Wt Readings from Last 10 Encounters:  06/15/15 157 lb 10.1 oz (71.5 kg)  05/06/15 157 lb 11.2 oz (71.532 kg)  04/28/15 156 lb (70.761 kg)  04/04/15 156 lb (70.761 kg)  03/31/15 151 lb  (68.493 kg)  03/30/15 151 lb (68.493 kg)  03/15/15 151 lb (68.493 kg)  11/30/14 157 lb 3 oz (71.3 kg)  09/16/14 151 lb (68.493 kg)  08/10/14 136 lb (61.689 kg)    BMI:  Body mass index is 29.8 kg/(m^2).  Estimated Nutritional Needs:   Kcal:  2000-2200  Protein:  110-120 gm  Fluid:  2.0-2.2 L  EDUCATION NEEDS:   No education needs identified at this time  Arthur Holms, RD, LDN Pager #: [phone] After-Hours Pager #: [phone]

## 2015-06-15 NOTE — Progress Notes (Signed)
Physical Therapy Treatment Patient Details Name: Sharon Price MRN: [id number] DOB: 02/19/1946 Today's Date: 06/15/2015    History of Present Illness Adm for surgical esophagectomy 7/12; return to OR 7/20 for cervical anastomosis leak; (pt s/p esophageal cancer and has completed chemotherapy/radiation. Her treatment course has been complicated with severe dehydration poor nutrition, requiring a PEG tube to be placed which became infected and had to be replaced several times. PMHx- DM, PE, CKD    PT Comments    Pt progressing towards physical therapy goals. Was able to perform transfers and ambulation with supervision for safety and management of lines, however pt was very mindful of tube feed line. She was able to negotiate 4 steps with hands-on guarding for safety. After stair training pt appeared very fatigued. Will continue to follow and progress as able per POC.   Follow Up Recommendations  Home health PT     Equipment Recommendations  Other (comment) (Rollator)    Recommendations for Other Services       Precautions / Restrictions Precautions Precautions: Fall Precaution Comments: abdominal incision Restrictions Weight Bearing Restrictions: No    Mobility  Bed Mobility               General bed mobility comments: Pt sitting up in the recliner upon PT arrival.   Transfers Overall transfer level: Needs assistance Equipment used: 4-wheeled walker Transfers: Sit to/from Stand Sit to Stand: Supervision         General transfer comment: Supervision for lines. Pt was able to transition to/from EOB without assist.   Ambulation/Gait Ambulation/Gait assistance: Supervision Ambulation Distance (Feet): 225 Feet Assistive device: 4-wheeled walker Gait Pattern/deviations: Step-through pattern;Decreased stride length;Trunk flexed Gait velocity: Decreased Gait velocity interpretation: Below normal speed for age/gender General Gait Details: 2 seated rest breaks  required. Posture appears improved but still grossly flexed.    Stairs Stairs: Yes Stairs assistance: Min guard Stair Management: Two rails;Step to pattern Number of Stairs: 4 (2 stairs x2) General stair comments: VC's for sequencing and safety awareness. Pt did not require assistance, however hands-on guarding was provided for safety.   Wheelchair Mobility    Modified Rankin (Stroke Patients Only)       Balance Overall balance assessment: Needs assistance Sitting-balance support: Feet supported;No upper extremity supported Sitting balance-Leahy Scale: Good     Standing balance support: During functional activity;No upper extremity supported Standing balance-Leahy Scale: Fair                      Cognition Arousal/Alertness: Awake/alert Behavior During Therapy: WFL for tasks assessed/performed Overall Cognitive Status: Within Functional Limits for tasks assessed                      Exercises      General Comments        Pertinent Vitals/Pain Pain Assessment: No/denies pain    Home Living                      Prior Function            PT Goals (current goals can now be found in the care plan section) Acute Rehab PT Goals Patient Stated Goal: Return to PLOF - home on friday (2 days) PT Goal Formulation: With patient Time For Goal Achievement: 06/17/15 Potential to Achieve Goals: Good Progress towards PT goals: Progressing toward goals    Frequency  Min 3X/week    PT Plan Discharge plan needs to  be updated    Co-evaluation             End of Session Equipment Utilized During Treatment: Gait belt Activity Tolerance: Patient tolerated treatment well Patient left: in chair;with call bell/phone within reach     Time: 0830-0849 PT Time Calculation (min) (ACUTE ONLY): 19 min  Charges:  $Gait Training: 8-22 mins                    G Codes:      Rolinda Roan Jul 11, 2015, 11:08 AM   Rolinda Roan, PT, DPT Acute  Rehabilitation Services Pager: [phone]

## 2015-06-15 NOTE — Progress Notes (Signed)
Pt ambulated in hallway with rollator approximately 225 feet and had one rest stop. Pt resting with call bell within reach.  Will continue to monitor. Payton Emerald, RN

## 2015-06-15 NOTE — Discharge Summary (Signed)
Physician Discharge Summary  Patient ID: Sharon Price MRN: [id number] DOB/AGE: 1946/05/17 69 y.o.  Admit date: 05/10/2015 Discharge date: 06/17/2015  Admission Diagnoses:  Patient Active Problem List   Diagnosis Date Noted  . Pressure ulcer 05/30/2015  . Anastomotic leak following esophagectomy   . Atrial flutter 05/13/2015  . Esophageal cancer 05/10/2015  . Acute esophagitis   . Esophageal ulcer without bleeding   . Atypical chest pain   . Odynophagia   . CAP (community acquired pneumonia) 11/30/2014  . Sepsis with hypotension 11/30/2014  . DM2 (diabetes mellitus, type 2) 11/30/2014  . Thrombocytopenia 11/30/2014  . PNA (pneumonia) 11/30/2014  . Dyslipidemia   . Chronic kidney disease   . Hypertension   . Mechanical dysphagia   . Pulmonary emboli   . Patient on combined chemotherapy and radiation   . Cancer 03/30/2014   Discharge Diagnoses:   Patient Active Problem List   Diagnosis Date Noted  . Pressure ulcer 05/30/2015  . Anastomotic leak following esophagectomy   . Atrial flutter 05/13/2015  . Esophageal cancer 05/10/2015  . Acute esophagitis   . Esophageal ulcer without bleeding   . Atypical chest pain   . Odynophagia   . CAP (community acquired pneumonia) 11/30/2014  . Sepsis with hypotension 11/30/2014  . DM2 (diabetes mellitus, type 2) 11/30/2014  . Thrombocytopenia 11/30/2014  . PNA (pneumonia) 11/30/2014  . Dyslipidemia   . Chronic kidney disease   . Hypertension   . Mechanical dysphagia   . Pulmonary emboli   . Patient on combined chemotherapy and radiation   . Cancer 03/30/2014   Discharged Condition: good  History of Present Illness:  Ms. Sharon Price is a 69 yo white female with known history of Stage IIIA T3 N1 MO esophageal cancer for an ulcerated adenocarcinoma.  She underwent neoadjuvant chemotherapy with radiation .  This was complicated by poor nutrition, dehydration requiring PEG tube.  This ultimately became infected and required  replacement several times.  She has markedly improved.  Her weight has increased and she is taking a full diet without difficulty.  She continued to smoke despite cancer diagnosis.  She was diagnosed with PE in September 2015 and she was placed on Xarelto.  Under routine follow up with Endoscopy confirms recurrent persistent carcinoma in the distal esophagus via biopsy.  Further repeat Endoscopy on 03/31/2015 showed a GE mass which was biopsy proven infiltrating poorly differentiated Adenocarcinoma.  She was evaluated by Dr. Servando Price, who considered surgical resection.  However she would require Cardiology clearance prior to proceeding.  She was evaluated by Dr. Johnsie Price at which time she was found to be in A. Flutter.  She was scheduled to undergo cardioversion, however on day of procedure the patient was in NSR.  She was again evaluated by Sharon Price who felt she would be a candidate for esophagectomy.  The risks and benefits were explained to the patient and she was agreeable to proceed.  Hospital Course:   Ms. Sharon Price presented to [facility] on 05/10/2015.  She underwent Transhiatal Total Esophagectomy, Pyloroplasty, and placement of a feeding Jejunostomy tube.  She tolerated the procedure well and was taken to the SICU in stable condition.  During her stay in the SICU the patient was extubated the morning after surgery.  She had issues with hypotension and required Neo Synephrine and Dopamine drip.  She was weaned off drips as tolerated.  Her urinary output was low and he was treated with Albumin.  On POD #2 the patient was started  on tube feeds at a trickle rate.  This was increased as tolerated.  She developed some post operative Atrial Fibrillation.  She was treated with IV Amiodarone with successful conversion to NSR.  The patient continued to make slow progress.  Her NG tube was removed on POD # 6.  She underwent swallow study on POD #8.  This did not show evidence of a leak.  However, her neck drain  developed increased drainage.  It was felt despite the results of the study there was still likely a leak in the cervical anastomotic region.  Due to this the patient was taken back to the operating room on POD #10 for exploration of the left neck.  Her neck was cleaned, packed and left open for adequate drainage of leak.  She became febrile and developed leukocytosis with foul smelling drainage from the left neck.  She was treatedwith IV Antibiotics for a 14 day course.  She continued to have issues with hypotension.  Her hemoglobin was low and she was transfused 1 unit.  Once chest tube drainage decreased her chest tubes were removed without difficulty.  She was tolerating tube feeds at the goal rate.  She was felt medically stable for transfer to the step down unit in stable condition on POD # 15.  The patient has continued to progress.  She has had a cervical anastomotic leak for the remaining portion of her hospital stay.  Once drainage stopped her Penrose drain was removed from the left neck.  Her neck wound has drained, but this has slowed significantly over the past few weeks.  She has been started on sips of clears which she tolerated and she is now tolerating a full liquid diet.  Her open wound on the anterior side of her neck is no longer draining. Repeat swallow study was completed of POD #37.  This showed the anastomotic leak had resolved.  She has been started on Eliquis which is only appropriate medicine except Lovenox.  This is for her history of PE, she has been receiving treatment for this over the past 11 months.  Will defer to Heme/Onc to evaluate need for further treatment as normal course of therapy is 6 months, however with recent surgery may benefit from treatment for a couple more months.  The patient is doing well.  She continues to tolerate a full clear diet with soft foods.  She will remain on tube feeds nightly from 7pm to 7am.  Home health arrangements have been made.  Should no  issues arise overnight will plan to discharge patient tomorrow 06/16/2015.           Treatments: antibiotics: Zosyn and Diflucan, Tube Feeds rate 70 ml continuous form 7pm to 7am  Surgery:   Video bronchoscopy, transhiatal total esophagectomy with cervical esophagogastrostomy, pyloroplasty, feeding jejunostomy.   Opening of left neck incision and drainage under MAC anesthesia.  Discharge medications:    Medication List    STOP taking these medications        acetaminophen 500 MG tablet  Commonly known as:  TYLENOL  Replaced by:  acetaminophen 160 MG/5ML solution     gabapentin 300 MG capsule  Commonly known as:  NEURONTIN     HYDROcodone-acetaminophen 5-325 MG per tablet  Commonly known as:  NORCO/VICODIN     LORazepam 0.5 MG tablet  Commonly known as:  ATIVAN     metoprolol tartrate 25 MG tablet  Commonly known as:  LOPRESSOR  Replaced by:  metoprolol tartrate 25  mg/10 mL Susp     pantoprazole 40 MG tablet  Commonly known as:  PROTONIX  Replaced by:  pantoprazole sodium 40 mg/20 mL Pack     rivaroxaban 20 MG Tabs tablet  Commonly known as:  XARELTO     sucralfate 1 GM/10ML suspension  Commonly known as:  CARAFATE      TAKE these medications        acetaminophen 160 MG/5ML solution  Commonly known as:  TYLENOL  Take 10.2 mLs (325 mg total) by mouth every 4 (four) hours as needed for moderate pain or headache.     amiodarone 5 mg/mL Susp  Commonly known as:  CORDARONE  Take 40 mLs (200 mg total) by mouth daily.     apixaban 5 MG Tabs tablet  Commonly known as:  ELIQUIS  Take 1 tablet (5 mg total) by mouth 2 (two) times daily.     chlorpheniramine-HYDROcodone 10-8 MG/5ML Suer  Commonly known as:  TUSSIONEX  Place 5 mLs into feeding tube every 12 (twelve) hours as needed for cough.     ergocalciferol 50000 UNITS capsule  Commonly known as:  VITAMIN D2  Take 50,000 Units by mouth every 30 (thirty) days. Takes around the 1st or 2nd of month.      Gerhardt's butt cream Crea  Apply 1 application topically as needed for irritation.     insulin glargine 100 UNIT/ML injection  Commonly known as:  LANTUS  Inject 0.1 mLs (10 Units total) into the skin at bedtime.     lisinopril 10 MG tablet  Commonly known as:  PRINIVIL,ZESTRIL  Take 1 tablet (10 mg total) by mouth daily.     metFORMIN 500 MG tablet  Commonly known as:  GLUCOPHAGE  Take 1 tablet (500 mg total) by mouth 2 (two) times daily with a meal.     metoCLOPramide 5 MG/5ML solution  Commonly known as:  REGLAN  Take 10 mLs (10 mg total) by mouth every 6 (six) hours.     metoprolol tartrate 25 mg/10 mL Susp  Commonly known as:  LOPRESSOR  Take 5 mLs (12.5 mg total) by mouth 2 (two) times daily.     oxyCODONE 5 MG/5ML solution  Commonly known as:  ROXICODONE  Take 5 mLs (5 mg total) by mouth every 4 (four) hours as needed for severe pain.     pantoprazole sodium 40 mg/20 mL Pack  Commonly known as:  PROTONIX  Take 20 mLs (40 mg total) by mouth 2 (two) times daily.     PROAIR RESPICLICK IN  Inhale 1 puff into the lungs as needed (for coughing/ shortness of breath).     sennosides 8.8 MG/5ML syrup  Commonly known as:  SENOKOT  Take 10 mLs by mouth 2 (two) times daily.     simvastatin 40 MG tablet  Commonly known as:  ZOCOR  Take 40 mg by mouth daily.       Follow-up Information    Follow up with Grace Isaac, MD On 06/23/2015.   Specialty:  Cardiothoracic Surgery   Why:  Appointment is at 11:oo, please get CXR at 1030 prior to appointment at [place] located on [address] [facility] information:   [place] [address] [phone]       Follow up with [facility] A., MD.   Specialty:  Oncology   Why:  Please contact to set up post operative visit   Contact information:  [address] [place] [address] [phone]       Signed: Ellwood Handler 06/17/2015, 8:08 AM

## 2015-06-15 NOTE — Progress Notes (Addendum)
      [address]       [place],[address] [address]             [phone]      26 Days Post-Op Procedure(s) (LRB): EXPLORATION OF LEFT NECK (Left)   Subjective:  Ms. Nichols has no new complaints.  Objective: Vital signs in last 24 hours: Temp:  [97.2 F (36.2 C)-98.6 F (37 C)] 98.5 F (36.9 C) (08/17 0323) Pulse Rate:  [87-95] 88 (08/17 0323) Cardiac Rhythm:  [-] Normal sinus rhythm (08/16 1930) Resp:  [18] 18 (08/17 0323) BP: (139-146)/(60-75) 146/68 mmHg (08/17 0323) SpO2:  [94 %-98 %] 95 % (08/17 0323) Weight:  [157 lb 10.1 oz (71.5 kg)] 157 lb 10.1 oz (71.5 kg) (08/17 0323)  Intake/Output from previous day: 08/16 0701 - 08/17 0700 In: 865 [P.O.:25; NG/GT:840] Out: 1500 [Urine:1500]  General appearance: alert, cooperative and no distress Heart: regular rate and rhythm Lungs: clear to auscultation bilaterally Abdomen: soft, non-tender; bowel sounds normal; no masses,  no organomegaly Wound: clean, no drainage in neck wound, ABD wound clean and dry  Lab Results: No results for input(s): WBC, HGB, HCT, PLT in the last 72 hours. BMET: No results for input(s): NA, K, CL, CO2, GLUCOSE, BUN, CREATININE, CALCIUM in the last 72 hours.  PT/INR: No results for input(s): LABPROT, INR in the last 72 hours. ABG    Component Value Date/Time   PHART 7.353 05/11/2015 0356   HCO3 20.8 05/11/2015 0356   TCO2 22 05/11/2015 0356   ACIDBASEDEF 4.0* 05/11/2015 0356   O2SAT 94.0 05/11/2015 0356   CBG (last 3)   Recent Labs  06/15/15 0026 06/15/15 0442 06/15/15 0818  GLUCAP 170* 148* 191*    Assessment/Plan: S/P Procedure(s) (LRB): EXPLORATION OF LEFT NECK (Left)  1. Cervical Anastomotic Leak- neck is no longer draining- will plan for swallow study for tomorrow 2. H/O PE, will continue Lovenox for now...will call pharmacy to speak about Xarelto via J Tube 3.  GI- continue tubes, feeds, tolerating clear liquid diet 4. DIspo- patient stable, will repeat  swallow in AM.... If okay can potentially discharge home   LOS: 36 days    BARRETT, ERIN 06/15/2015  I have seen and examined Casilda Carls and agree with the above assessment  and plan.  Grace Isaac MD [facility] [phone] Office [phone] 06/15/2015 2:30 PM

## 2015-06-15 NOTE — Plan of Care (Signed)
Problem: Phase III Progression Outcomes Goal: Discharge plan remains appropriate-arrangements made Outcome: Progressing Will discharge home but will need j-tube feeding. Needs rollator

## 2015-06-16 ENCOUNTER — Inpatient Hospital Stay (HOSPITAL_COMMUNITY): Payer: Commercial Managed Care - PPO

## 2015-06-16 LAB — CBC
HCT: 34 % — ABNORMAL LOW (ref 36.0–46.0)
Hemoglobin: 11.3 g/dL — ABNORMAL LOW (ref 12.0–15.0)
MCH: 29.7 pg (ref 26.0–34.0)
MCHC: 33.2 g/dL (ref 30.0–36.0)
MCV: 89.2 fL (ref 78.0–100.0)
Platelets: 417 10*3/uL — ABNORMAL HIGH (ref 150–400)
RBC: 3.81 MIL/uL — ABNORMAL LOW (ref 3.87–5.11)
RDW: 15.6 % — ABNORMAL HIGH (ref 11.5–15.5)
WBC: 12.3 10*3/uL — ABNORMAL HIGH (ref 4.0–10.5)

## 2015-06-16 LAB — GLUCOSE, CAPILLARY
Glucose-Capillary: 115 mg/dL — ABNORMAL HIGH (ref 65–99)
Glucose-Capillary: 166 mg/dL — ABNORMAL HIGH (ref 65–99)
Glucose-Capillary: 184 mg/dL — ABNORMAL HIGH (ref 65–99)
Glucose-Capillary: 189 mg/dL — ABNORMAL HIGH (ref 65–99)
Glucose-Capillary: 213 mg/dL — ABNORMAL HIGH (ref 65–99)
Glucose-Capillary: 220 mg/dL — ABNORMAL HIGH (ref 65–99)

## 2015-06-16 LAB — COMPREHENSIVE METABOLIC PANEL
ALT: 49 U/L (ref 14–54)
AST: 50 U/L — ABNORMAL HIGH (ref 15–41)
Albumin: 2.6 g/dL — ABNORMAL LOW (ref 3.5–5.0)
Alkaline Phosphatase: 228 U/L — ABNORMAL HIGH (ref 38–126)
Anion gap: 8 (ref 5–15)
BUN: 31 mg/dL — ABNORMAL HIGH (ref 6–20)
CO2: 29 mmol/L (ref 22–32)
Calcium: 9.7 mg/dL (ref 8.9–10.3)
Chloride: 100 mmol/L — ABNORMAL LOW (ref 101–111)
Creatinine, Ser: 0.87 mg/dL (ref 0.44–1.00)
GFR calc Af Amer: >60 mL/min (ref >60)
GFR calc non Af Amer: >60 mL/min (ref >60)
Glucose, Bld: 121 mg/dL — ABNORMAL HIGH (ref 65–99)
Potassium: 4.9 mmol/L (ref 3.5–5.1)
Sodium: 137 mmol/L (ref 135–145)
Total Bilirubin: 0.7 mg/dL (ref 0.3–1.2)
Total Protein: 7.4 g/dL (ref 6.5–8.1)

## 2015-06-16 MED ORDER — IOHEXOL 300 MG/ML  SOLN
150.0000 mL | Freq: Once | INTRAMUSCULAR | Status: DC | PRN
  Administered 2015-06-16: 50 mL via ORAL
  Filled 2015-06-16: qty 150

## 2015-06-16 MED ORDER — APIXABAN 5 MG PO TABS
5.0000 mg | ORAL_TABLET | Freq: Two times a day (BID) | ORAL | Status: DC
  Administered 2015-06-16 – 2015-06-17 (×2): 5 mg via JEJUNOSTOMY
  Filled 2015-06-16 (×4): qty 1

## 2015-06-16 NOTE — Discharge Instructions (Addendum)
°  Information on my medicine - ELIQUIS (apixaban)  This medication education was reviewed with me or my healthcare representative as part of my discharge preparation.  The pharmacist that spoke with me during my hospital stay was:  Wayland Salinas, [facility]  Why was Eliquis prescribed for you? Eliquis was prescribed to treat blood clots that may have been found in the veins of your legs (deep vein thrombosis) or in your lungs (pulmonary embolism) and to reduce the risk of them occurring again.  What do You need to know about Eliquis ? The dose is  ONE 5 mg tablet taken TWICE daily.  Eliquis may be taken with or without food.   Try to take the dose about the same time in the morning and in the evening. If you have difficulty swallowing the tablet whole please discuss with your pharmacist how to take the medication safely.  Take Eliquis exactly as prescribed and DO NOT stop taking Eliquis without talking to the doctor who prescribed the medication.  Stopping may increase your risk of developing a new blood clot.  Refill your prescription before you run out.  After discharge, you should have regular check-up appointments with your healthcare provider that is prescribing your Eliquis.    What do you do if you miss a dose? If a dose of ELIQUIS is not taken at the scheduled time, take it as soon as possible on the same day and twice-daily administration should be resumed. The dose should not be doubled to make up for a missed dose.  Important Safety Information A possible side effect of Eliquis is bleeding. You should call your healthcare provider right away if you experience any of the following: ? Bleeding from an injury or your nose that does not stop. ? Unusual colored urine (red or dark brown) or unusual colored stools (red or black). ? Unusual bruising for unknown reasons. ? A serious fall or if you hit your head (even if there is no bleeding).  Some medicines may  interact with Eliquis and might increase your risk of bleeding or clotting while on Eliquis. To help avoid this, consult your healthcare provider or pharmacist prior to using any new prescription or non-prescription medications, including herbals, vitamins, non-steroidal anti-inflammatory drugs (NSAIDs) and supplements.  This website has more information on Eliquis (apixaban): http://www.eliquis.com/eliquis/home   Diet:  Full Liquid with soft Foods  Tube Feeds:  Run nightly at 44ml/hr from 7pm-7am  May sponge bath, may shower if water occlusive dressing is over feeding tube, no tub bathing or swimming  No driving unless off all narcotic pain medication  May take pills by mouth, if makes stomach upset take with small food or may crush in applesauce

## 2015-06-16 NOTE — Progress Notes (Signed)
ANTICOAGULATION CONSULT NOTE - Initial Consult  Pharmacy Consult for Eliquis Indication: h/o PE  No Known Allergies  Patient Measurements: Height: 5\' 1"  (154.9 cm) Weight: 157 lb 10.1 oz (71.5 kg) IBW/kg (Calculated) : 47.8  Vital Signs: Temp: 98 F (36.7 C) (08/18 0416) Temp Source: Oral (08/18 0416) BP: 151/72 mmHg (08/18 0416) Pulse Rate: 84 (08/18 0416)  Labs:  Recent Labs  06/16/15 0408  HGB 11.3*  HCT 34.0*  PLT 417*  CREATININE 0.87    Estimated Creatinine Clearance: 56 mL/min (by C-G formula based on Cr of 0.87).   Medical History: Past Medical History  Diagnosis Date  . Chronic kidney disease     renal..STAGE 2  . Hypertension   . Mechanical dysphagia     MOSTLY SOLIDS  . Pulmonary emboli 9/223/15 [place]    RIGHT LOWER LOBE PER CT CHEST   . Patient on combined chemotherapy and radiation     [facility]  . Cancer 03/30/14    GE JUNCTION  . Transfusion history     ?[facility].  . Family history of adverse reaction to anesthesia     Patients sister is very nauseated after anesthesia  . Pneumonia   . Diabetes mellitus without complication     Type 2  . Hyperlipemia   . GERD (gastroesophageal reflux disease)     Medications:  Prescriptions prior to admission  Medication Sig Dispense Refill Last Dose  . acetaminophen (TYLENOL) 500 MG tablet Take 1,000 mg by mouth every 6 (six) hours as needed for moderate pain or headache.   05/09/2015 at Unknown time  . Albuterol Sulfate (PROAIR RESPICLICK IN) Inhale 1 puff into the lungs as needed (for coughing/ shortness of breath).    05/09/2015 at Unknown time  . ergocalciferol (VITAMIN D2) 50000 UNITS capsule Take 50,000 Units by mouth every 30 (thirty) days. Takes around the 1st or 2nd of month.   Past Month at Unknown time  . gabapentin (NEURONTIN) 300 MG capsule Take 300 mg by mouth 4 (four) times daily.   05/10/2015 at 0330  .  HYDROcodone-acetaminophen (NORCO/VICODIN) 5-325 MG per tablet Take 1 tablet by mouth every 4 (four) hours as needed for moderate pain. 65 tablet 0 Past Week at Unknown time  . insulin glargine (LANTUS) 100 UNIT/ML injection Inject 0.1 mLs (10 Units total) into the skin at bedtime. (Patient taking differently: Inject 20 Units into the skin at bedtime. ) 10 mL 5 05/09/2015 at Unknown time  . lisinopril (PRINIVIL,ZESTRIL) 10 MG tablet Take 1 tablet (10 mg total) by mouth daily. 90 tablet 3 05/09/2015 at Unknown time  . LORazepam (ATIVAN) 0.5 MG tablet Take 1 tablet (0.5 mg total) by mouth every 8 (eight) hours as needed for anxiety. 30 tablet 0 05/09/2015 at Unknown time  . metFORMIN (GLUCOPHAGE) 500 MG tablet Take 1 tablet (500 mg total) by mouth 2 (two) times daily with a meal.   05/09/2015 at Unknown time  . metoprolol tartrate (LOPRESSOR) 25 MG tablet Take 1 tablet (25 mg total) by mouth 2 (two) times daily. 60 tablet 11 05/10/2015 at 0330  . pantoprazole (PROTONIX) 40 MG tablet Take 1 tablet (40 mg total) by mouth 2 (two) times daily before a meal. 60 tablet 1 05/10/2015 at 0330  . rivaroxaban (XARELTO) 20 MG TABS tablet Take 20 mg by mouth daily with supper.   Past Week at Unknown time  . simvastatin (ZOCOR) 40 MG tablet Take 40 mg by mouth daily.  05/09/2015 at Unknown time  . sucralfate (CARAFATE) 1 GM/10ML suspension Take 10 mLs (1 g total) by mouth 4 (four) times daily -  before meals and at bedtime. 420 mL 5 05/09/2015 at Unknown time  . sennosides (SENOKOT) 8.8 MG/5ML syrup Take 10 mLs by mouth 2 (two) times daily. (Patient not taking: Reported on 05/05/2015) 240 mL 5 Taking    Assessment: Admitted for esophageal resection, h/o PE (Sep 2015), Xarelto on hold - not to be restarted as patient has a PEJ tube. Change to Eliquis instead.  **PE 9/15  **05/04/15: VATS/total esohpageal resection for esophageal cancer. Course Complicated by neck incision wound, anastomotic leak.   Anticoagulation: PE 9/16  on Xarelto PTA. S/p PEJ tube 8/15. CANNOT crush Xarelto or administer via J tube. Maintained on Lovenox now convert to Eliquis (discussed with CVTS PA on 8/17) which is absorbed throughout the GI tract with 55% in distal small intesine and ascending colon (past the jejunum). Can be crushed and suspended in liquid (package insert).  Another source in the journal Clinical Therapeutics: When apixaban is delivered directly to the distal small bowel and the ascending colon, apixaban exposure is 40% and 10% of that achieved after oral administration, indicating the upper GI tract (duodenum, jejunum, and ileum) as the primary absorption region. -Song. Doi:10.1016/j.clinthera.[email or web address]   Goal of Therapy:  05/03/2015 Monitor platelets by anticoagulation protocol: Yes   Plan:  - D/c Lovenox - Eliquis 5mg  BID via J tube. There are not studies to recommend increasing this dose to increase the amount of absorption. - Would heme/onc prefer to use Lovenox in this type of patient?  - What is her length of therapy (currently s/p 11 months treatment)?    Weda Baumgarner S. Alford Highland, PharmD, BCPS Clinical Staff Pharmacist Pager [phone]  Eilene Ghazi Stillinger 06/16/2015,8:55 AM

## 2015-06-16 NOTE — Care Management Note (Signed)
Case Management Note CM note started by [place]  Patient Details  Name: Sharon Price MRN: [id number] Date of Birth: [birth year]  Subjective/Objective:       Pt admitted with esophageal cancer             Action/Plan:  Pt is independent from home.  Pt was active with [facility] earlier this year for PT.  Pt is unsure which agency provided dme and tube feeds.  CM will monitor for disposition needs   Expected Discharge Date:                  Expected Discharge Plan:  [place]  In-House Referral:  Clinical Social Work  Discharge planning Services  CM Consult  Post Acute Care Choice:  Home Health, Durable Medical Equipment Choice offered to:  Patient  DME Arranged:    DME Agency:  [place]:    [place]:  [place]  Status of Service:  In process, will continue to follow  Medicare Important Message Given:    Date Medicare IM Given:    Medicare IM give by:    Date Additional Medicare IM Given:    Additional Medicare Important Message give by:     If discussed at [place] of Stay Meetings, dates discussed:  06/07/15, 06/09/15, 06/14/15, 06/16/15  Additional Comments:  06/16/15- orders for TF and HH received- with d/c planning for tomorrow if [facility] can be arranged for late evening- have spoken with Jermaine from [facility] regarding TF needs- also have called and spoke with Tillie Rung at [facility] services of [facility] regarding [facility] referral- faxed needed paperwork to [facility] for review- awaiting return call to see if [facility] services can start on Friday evening vs Sat. Will f/u once word has been received on available start date with [facility].   06/15/15- per CSW pt did not receive approval for SNF - will need to d/c home with [facility] at discharge- spoke with pt at bedside regarding this plan- pt is ok with discharge to home with [facility] and states that she will have plenty of help at home- pt reports  that she still has TF pump with pole at home- will need orders for TF type and rate instructions for home- also will need orders for HH-RN/PT/OT/aide with F2F. Per pt plan is for possible repeat swallow study tomorrow. NCM to continue to follow to assist with d/c needs.    06/10/15- spoke with pt at bedside regarding d/c plans- per conversation pt states that MD had conversation with her that rehab might be that best plan for her at discharge- pt is agreeable to this plan as she will need TF at discharge along with wound care- pt reports that she has been at [facility] in [place] and [place] in past- CSW has been consulted for possible SNF placement- pt also states that she would like to have a rollator for home and would like to see if she can have it ordered here and delivered so that she will have it when she goes home from SNF- will request order from MD. Will continue to follow. Plan for repeat swallow study first of next week.   06/07/15- pt continues to have leak, on TF and sips/chips- NCM to continue to follow for d/c needs  CM assessed pt, pt stated she would like to use the same agency used previously [facility].  Agency verified with CM that pt was discharged from services in March 2016.  Agency stated that tube feeds or IV meds will need to be set up with [facility], [facility] can provide HH.  CM will continue to monitor for disposition needs.  Dawayne Patricia, RN 06/16/2015, 10:14 PM

## 2015-06-17 LAB — GLUCOSE, CAPILLARY
Glucose-Capillary: 161 mg/dL — ABNORMAL HIGH (ref 65–99)
Glucose-Capillary: 171 mg/dL — ABNORMAL HIGH (ref 65–99)
Glucose-Capillary: 176 mg/dL — ABNORMAL HIGH (ref 65–99)
Glucose-Capillary: 185 mg/dL — ABNORMAL HIGH (ref 65–99)
Glucose-Capillary: 260 mg/dL — ABNORMAL HIGH (ref 65–99)

## 2015-06-17 MED ORDER — GERHARDT'S BUTT CREAM: 1.0000 "application " | TOPICAL_CREAM | CUTANEOUS | Status: DC | PRN

## 2015-06-17 MED ORDER — APIXABAN 5 MG PO TABS: 5.0000 mg | ORAL_TABLET | Freq: Two times a day (BID) | ORAL | Status: DC

## 2015-06-17 MED ORDER — METOPROLOL TARTRATE 25 MG/10 ML ORAL SUSPENSION: 12.5000 mg | Freq: Two times a day (BID) | ORAL | Status: DC

## 2015-06-17 MED ORDER — OXYCODONE HCL 5 MG/5ML PO SOLN: 5.0000 mg | ORAL | Status: DC | PRN

## 2015-06-17 MED ORDER — HYDROCOD POLST-CPM POLST ER 10-8 MG/5ML PO SUER: 5.0000 mL | Freq: Two times a day (BID) | ORAL | Status: DC | PRN

## 2015-06-17 MED ORDER — ACETAMINOPHEN 160 MG/5ML PO SOLN: 325.0000 mg | ORAL | Status: DC | PRN

## 2015-06-17 MED ORDER — PANTOPRAZOLE SODIUM 40 MG PO PACK: 40.0000 mg | PACK | Freq: Two times a day (BID) | ORAL | Status: AC

## 2015-06-17 MED ORDER — AMIODARONE PEDIATRIC ORAL SUSPENSION 5 MG/ML: 200.0000 mg | Freq: Every day | ORAL | Status: DC

## 2015-06-17 MED ORDER — METOCLOPRAMIDE HCL 5 MG/5ML PO SOLN: 10.0000 mg | Freq: Four times a day (QID) | ORAL | Status: DC

## 2015-06-17 NOTE — Progress Notes (Signed)
Physical Therapy Treatment Patient Details Name: Sharon Price MRN: [id number] DOB: June 07, 1946 Today's Date: 06/17/2015    History of Present Illness Adm for surgical esophagectomy 7/12; return to OR 7/20 for cervical anastomosis leak; (pt s/p esophageal cancer and has completed chemotherapy/radiation. Her treatment course has been complicated with severe dehydration poor nutrition, requiring a PEG tube to be placed which became infected and had to be replaced several times. PMHx- DM, PE, CKD    PT Comments    Pt progressing well with mobility. Met acute PT goals this session and is anticipating d/c home this afternoon. Will sign off at this time. If needs change, please re-consult.   Follow Up Recommendations  Home health PT     Equipment Recommendations  Other (comment) (Rollator)    Recommendations for Other Services OT consult     Precautions / Restrictions Precautions Precautions: Fall Precaution Comments: abdominal incision Restrictions Weight Bearing Restrictions: No    Mobility  Bed Mobility Overal bed mobility: Modified Independent             General bed mobility comments: No physical assistance required. Pt able to negotiate bed linen well.   Transfers   Equipment used: 4-wheeled walker Transfers: Sit to/from [employer] to Stand: Modified independent (Device/Increase time) Stand pivot transfers: Modified independent (Device/Increase time)       General transfer comment: No physical assist required. Good safety awareness.   Ambulation/Gait Ambulation/Gait assistance: Modified independent (Device/Increase time) Ambulation Distance (Feet): 400 Feet Assistive device: 4-wheeled walker Gait Pattern/deviations: Step-through pattern;Decreased stride length;Trunk flexed Gait velocity: Decreased Gait velocity interpretation: Below normal speed for age/gender General Gait Details: Seated rest breaks due to fatigue. Pt managed rollator  well.    Stairs            Wheelchair Mobility    Modified Rankin (Stroke Patients Only)       Balance Overall balance assessment: Needs assistance Sitting-balance support: Feet supported;No upper extremity supported Sitting balance-Leahy Scale: Good     Standing balance support: During functional activity;No upper extremity supported Standing balance-Leahy Scale: Fair                      Cognition Arousal/Alertness: Awake/alert Behavior During Therapy: WFL for tasks assessed/performed Overall Cognitive Status: Within Functional Limits for tasks assessed                      Exercises      General Comments        Pertinent Vitals/Pain Pain Assessment: No/denies pain    Home Living                      Prior Function            PT Goals (current goals can now be found in the care plan section) Acute Rehab PT Goals Patient Stated Goal: Home today PT Goal Formulation: With patient Time For Goal Achievement: 06/17/15 Potential to Achieve Goals: Good Progress towards PT goals: Progressing toward goals    Frequency  Min 3X/week    PT Plan Current plan remains appropriate    Co-evaluation             End of Session Equipment Utilized During Treatment: Gait belt Activity Tolerance: Patient tolerated treatment well Patient left: in chair;with call bell/phone within reach     Time: 1135-1207 PT Time Calculation (min) (ACUTE ONLY): 32 min  Charges:  $Gait Training: 8-22 mins $  Therapeutic Activity: 8-22 mins                    G Codes:      Rolinda Roan 06/29/15, 12:26 PM   Rolinda Roan, PT, DPT Acute Rehabilitation Services Pager: [phone]

## 2015-06-17 NOTE — Progress Notes (Signed)
Discharged to home with family office visits in place teaching done Discharged to home with family office visits in place teaching done  

## 2015-06-17 NOTE — Progress Notes (Signed)
      [address]       [place] [address]             [phone]      28 Days Post-Op Procedure(s) (LRB): EXPLORATION OF LEFT NECK (Left)   Subjective:  Ms. Morr has no complaints.  She is happy to be going home.  In regards to home health.  She states she already has a pump for her tube feedings and her daughter knows how to administer.  Objective: Vital signs in last 24 hours: Temp:  [98.2 F (36.8 C)-98.9 F (37.2 C)] 98.2 F (36.8 C) (08/19 0402) Pulse Rate:  [78-90] 78 (08/19 0402) Cardiac Rhythm:  [-] Heart block (08/19 0100) Resp:  [18-20] 18 (08/19 0402) BP: (121-150)/(56-67) 121/56 mmHg (08/19 0402) SpO2:  [93 %-96 %] 93 % (08/19 0402) Weight:  [157 lb 6.5 oz (71.4 kg)] 157 lb 6.5 oz (71.4 kg) (08/19 0402)  Intake/Output from previous day: 08/18 0701 - 08/19 0700 In: -  Out: 1250 [Urine:1250]  General appearance: alert, cooperative and no distress Heart: regular rate and rhythm Lungs: clear to auscultation bilaterally Abdomen: soft, non-tender; bowel sounds normal; no masses,  no organomegaly Wound: clean and dry, J tube remains stitched in place  Lab Results:  Recent Labs  06/16/15 0408  WBC 12.3*  HGB 11.3*  HCT 34.0*  PLT 417*   BMET:  Recent Labs  06/16/15 0408  NA 137  K 4.9  CL 100*  CO2 29  GLUCOSE 121*  BUN 31*  CREATININE 0.87  CALCIUM 9.7    PT/INR: No results for input(s): LABPROT, INR in the last 72 hours. ABG    Component Value Date/Time   PHART 7.353 05/11/2015 0356   HCO3 20.8 05/11/2015 0356   TCO2 22 05/11/2015 0356   ACIDBASEDEF 4.0* 05/11/2015 0356   O2SAT 94.0 05/11/2015 0356   CBG (last 3)   Recent Labs  06/16/15 1623 06/16/15 1947 06/17/15 0022  GLUCAP 189* 166* 161*    Assessment/Plan: S/P Procedure(s) (LRB): EXPLORATION OF LEFT NECK (Left)  1. S/P Esophagectomy- cervical anastomotic leak resolved- continue full liquid diet with soft foods 2. GI- tube feeds at goal, will  decrease to nightly administration 7p-7am at discharge 3. Dispo- patient stable, will d/c home today, will follow up to make sure all home health arrangements are made   LOS: 38 days    [place], Lucresha Dismuke 06/17/2015

## 2015-06-17 NOTE — Care Management Important Message (Signed)
Important Message  Patient Details  Name: Sharon Price MRN: [id number] Date of Birth: 1946-04-23   Medicare Important Message Given:  Yes-second notification given    Dawayne Patricia, RN 06/17/2015, 10:25 AM

## 2015-06-17 NOTE — Care Management Note (Addendum)
Case Management Note CM note started by [place]  Patient Details  Name: Sharon Price MRN: [id number] Date of Birth: 1946-07-21  Subjective/Objective:       Pt admitted with esophageal cancer             Action/Plan:  Pt is independent from home.  Pt was active with [facility] earlier this year for PT.  Pt is unsure which agency provided dme and tube feeds.  CM will monitor for disposition needs   Expected Discharge Date:      06/17/15            Expected Discharge Plan:  [place]  In-House Referral:  Clinical Social Work  Discharge planning Services  CM Consult  Post Acute Care Choice:  Home Health, Durable Medical Equipment Choice offered to:  Patient  DME Arranged:    TF for home with supplies DME Agency:  [place]:   RN/PT [place]:  [place]  Status of Service:  Completed, signed off Medicare Important Message Given:  Yes-second notification given Date Medicare IM Given:    Medicare IM give by:    Date Additional Medicare IM Given:    Additional Medicare Important Message give by:     If discussed at [place] of Stay Meetings, dates discussed:  06/07/15, 06/09/15, 06/14/15, 06/16/15  Additional Comments:  06/17/15- spoke with Tillie Rung at [facility] and have confirmed that [facility] RN will be able to start services this am - they are aware that pt will not be home till around 7 pm. Have also spoken with Colletta Maryland at [facility] and home TF have been arranged and will be delivered today for discharge she will also deliver rollator to pt's room this afternoon. Pt aware of these arrangements and has no other d/c needs. Erin Barrett PA with surgery also aware that [facility] arrangements have been confirmed.   06/16/15- orders for TF and HH received- with d/c planning for tomorrow if [facility] can be arranged for late evening- have spoken with Jermaine from [facility] regarding TF needs- also have called  and spoke with Tillie Rung at [facility] services of [facility] regarding [facility] referral- [facility]- awaiting return call to see if [facility] services can start on Friday evening vs Sat. Will f/u once word has been received on available start date with [facility].   06/15/15- per CSW pt did not receive approval for SNF - will need to d/c home with [facility] at discharge- spoke with pt at bedside regarding this plan- pt is ok with discharge to home with [facility] and states that she will have plenty of help at home- pt reports that she still has TF pump with pole at home- will need orders for TF type and rate instructions for home- also will need orders for HH-RN/PT/OT/aide with F2F. Per pt plan is for possible repeat swallow study tomorrow. NCM to continue to follow to assist with d/c needs.    06/10/15- spoke with pt at bedside regarding d/c plans- per conversation pt states that MD had conversation with her that rehab might be that best plan for her at discharge- pt is agreeable to this plan as she will need TF at discharge along with wound care- pt reports that she has been at [facility] in [place] and [place] in past- CSW has been consulted for possible SNF placement- pt also  states that she would like to have a rollator for home and would like to see if she can have it ordered here and delivered so that she will have it when she goes home from SNF- will request order from MD. Will continue to follow. Plan for repeat swallow study first of next week.   06/07/15- pt continues to have leak, on TF and sips/chips- NCM to continue to follow for d/c needs  CM assessed pt, pt stated she would like to use the same agency used previously [facility].  Agency verified with CM that pt was discharged from services in March 2016.  Agency stated that tube feeds or IV meds will need to be set up with [facility], [facility] can provide HH.  CM will continue to monitor for disposition  needs.  Dawayne Patricia, RN 06/17/2015, 1:28 PM

## 2015-06-21 ENCOUNTER — Other Ambulatory Visit: Payer: Self-pay | Admitting: Cardiothoracic Surgery

## 2015-06-21 DIAGNOSIS — C159 Malignant neoplasm of esophagus, unspecified: Secondary | ICD-10-CM

## 2015-06-23 ENCOUNTER — Ambulatory Visit (INDEPENDENT_AMBULATORY_CARE_PROVIDER_SITE_OTHER): Payer: Self-pay | Admitting: Cardiothoracic Surgery

## 2015-06-23 ENCOUNTER — Ambulatory Visit
Admission: RE | Admit: 2015-06-23 | Discharge: 2015-06-23 | Disposition: A | Discharge status: Home / Self Care | Payer: Medicare Other | Source: Ambulatory Visit | Attending: Cardiothoracic Surgery | Admitting: Cardiothoracic Surgery

## 2015-06-23 ENCOUNTER — Encounter: Payer: Self-pay | Admitting: Cardiothoracic Surgery

## 2015-06-23 VITALS — BP 122/61 | HR 56 | Resp 20 | Ht 61.0 in | Wt 158.0 lb

## 2015-06-23 DIAGNOSIS — Z923 Personal history of irradiation: Secondary | ICD-10-CM

## 2015-06-23 DIAGNOSIS — C159 Malignant neoplasm of esophagus, unspecified: Secondary | ICD-10-CM

## 2015-06-23 DIAGNOSIS — Z5189 Encounter for other specified aftercare: Secondary | ICD-10-CM

## 2015-06-23 DIAGNOSIS — Z9221 Personal history of antineoplastic chemotherapy: Secondary | ICD-10-CM

## 2015-06-23 NOTE — Progress Notes (Signed)
[address]       [place] [address]             [place] Record #[id number] Date of Birth: [birth year]  Referring: Marice Potter, MD Primary Care: Lillard Anes, MD  Chief Complaint:    Chief Complaint  Patient presents with  . Routine Post Op    1 week f/u with CXR s/p -  total esophagectomy, Opening of left neck incision and drainage    05/10/2015 DATE OF DISCHARGE:  OPERATIVE REPORT PREOPERATIVE DIAGNOSIS: Adenocarcinoma of the distal esophagus. POSTOPERATIVE DIAGNOSIS: Adenocarcinoma of the distal esophagus. PROCEDURE: Video bronchoscopy, transhiatal total esophagectomy with cervical esophagogastrostomy, pyloroplasty, feeding jejunostomy. SURGEON: Lanelle Bal, MD.  Esophageal cancer   Staging form: Esophagus - Adenocarcinoma, AJCC 7th Edition     Pathologic stage from 05/13/2015: Stage IIB (T3, N0, cM0, G3 - Poorly differentiated) - Signed by Grace Isaac, MD on 05/16/2015       History of Present Illness:    Sharon Price 69 y.o. female is seen in the office in follow-up after resection of her adenocarcinoma the distal esophagus. She underwent surgery on June 12, did develop a cervical anastomotic leak which gradually healed and has now closed. She is now at home on jejunal tube feedings at night and soft diet during the day. Since discharge she's had no fever chills and appears to be progressing well.   Originally she was diagnosed with clinical stage IIIa T3 N1 MO esophageal cancer for an ulcerated adenocarcinoma [place] pathology [id number]. The patient has completed neoadjuvant chemotherapy radiation with weekly carboplatin and and paclltaxel in AUG 2015. Her treatment course has been complicated with severe dehydration poor nutrition, requiring a PEG tube to be placed which  Became  infected and had to be replaced several times.   Patient is no longer  smoking.   Current Activity/ Functional Status:  Patient is independent with mobility/ambulation, transfers, ADL's, IADL's.   Zubrod Score: At the time of surgery this patient's most appropriate activity status/level should be described as: []     0    Normal activity, no symptoms [x]     1    Restricted in physical strenuous activity but ambulatory, able to do out light work []     2    Ambulatory and capable of self care, unable to do work activities, up and about               >50 % of waking hours                              []     3    Only limited self care, in bed greater than 50% of waking hours []     4    Completely disabled, no self care, confined to bed or chair []     5    Moribund   Past Medical History  Diagnosis Date  . Chronic kidney disease     renal..STAGE 2  . Hypertension   . Mechanical dysphagia     MOSTLY SOLIDS  . Pulmonary emboli 9/223/15 [place]    RIGHT LOWER LOBE PER CT CHEST   . Patient on combined chemotherapy and radiation     [facility]  .  Cancer 03/30/14    GE JUNCTION  . Transfusion history     ?[facility].  . Family history of adverse reaction to anesthesia     Patients sister is very nauseated after anesthesia  . Pneumonia   . Diabetes mellitus without complication     Type 2  . Hyperlipemia   . GERD (gastroesophageal reflux disease)     Past Surgical History  Procedure Laterality Date  . Back surgery    . Abdominal hysterectomy    . Tubal ligation    . Eus N/A 04/08/2014    Procedure: ESOPHAGEAL ENDOSCOPIC ULTRASOUND (EUS) RADIAL;  Surgeon: Milus Banister, MD;  Location: [facility];  Service: Endoscopy;  Laterality: N/A;  . Appendectomy    . Spine surgery    . Lumbar fusion    . Esophagogastroduodenoscopy (egd) with propofol N/A 12/02/2014    Procedure: ESOPHAGOGASTRODUODENOSCOPY (EGD) WITH PROPOFOL;  Surgeon: Jerene Bears, MD;  Location: [facility];  Service: Endoscopy;   Laterality: N/A;  . Eus N/A 03/31/2015    Procedure: UPPER ENDOSCOPIC ULTRASOUND (EUS) RADIAL;  Surgeon: Milus Banister, MD;  Location: [facility];  Service: Endoscopy;  Laterality: N/A;  . Cataract extraction Bilateral   . Video bronchoscopy N/A 05/10/2015    Procedure: VIDEO BRONCHOSCOPY;  Surgeon: Grace Isaac, MD;  Location: [place];  Service: Thoracic;  Laterality: N/A;  . Complete esophagectomy N/A 05/10/2015    Procedure: TRANSHIATAL TOTAL ESOPHAGEAL RESECTION;  Surgeon: Grace Isaac, MD;  Location: [place];  Service: Thoracic;  Laterality: N/A;  . Jejunostomy N/A 05/10/2015    Procedure: FEEDING JEJUNOSTOMY;  Surgeon: Grace Isaac, MD;  Location: [place];  Service: Thoracic;  Laterality: N/A;  . Mass excision Left 05/20/2015    Procedure: EXPLORATION OF LEFT NECK;  Surgeon: Grace Isaac, MD;  Location: [place];  Service: Thoracic;  Laterality: Left;    Family History  Problem Relation Age of Onset  . Diabetes Mother   . Hypertension Mother   . Stroke Mother   . Heart disease Mother   . Hypertension Father   . Diabetes Sister   . Cancer Sister     THYROID  . Cancer Brother     LUNG  . Diabetes Daughter   . Diabetes Son     Social History   Social History  . Marital Status: Widowed    Spouse Name: N/A  . Number of Children: N/A  . Years of Education: N/A   Occupational History  . Not on file.   Social History Main Topics  . Smoking status: Former Smoker -- 1.00 packs/day for 20 years    Quit date: 03/02/2015  . Smokeless tobacco: Not on file     Comment: 3 weeks- < 1ppd.  . Alcohol Use: No  . Drug Use: No  . Sexual Activity: Not on file   Other Topics Concern  . Not on file   Social History Narrative    History  Smoking status  . Former Smoker -- 1.00 packs/day for 20 years  . Quit date: 03/02/2015  Smokeless tobacco  . Not on file    Comment: 3 weeks- < 1ppd.    History  Alcohol Use No     No Known Allergies  Current Outpatient  Prescriptions  Medication Sig Dispense Refill  . acetaminophen (TYLENOL) 160 MG/5ML solution Take 10.2 mLs (325 mg total) by mouth every 4 (four) hours as needed for moderate pain or headache. 473 mL 1  .  Albuterol Sulfate (PROAIR RESPICLICK IN) Inhale 1 puff into the lungs as needed (for coughing/ shortness of breath).     Marland Kitchen amiodarone (CORDARONE) 5 mg/mL SUSP Take 40 mLs (200 mg total) by mouth daily. 250 mL 1  . apixaban (ELIQUIS) 5 MG TABS tablet Take 1 tablet (5 mg total) by mouth 2 (two) times daily. 60 tablet 1  . chlorpheniramine-HYDROcodone (TUSSIONEX) 10-8 MG/5ML SUER Place 5 mLs into feeding tube every 12 (twelve) hours as needed for cough. 140 mL 0  . ergocalciferol (VITAMIN D2) 50000 UNITS capsule Take 50,000 Units by mouth every 30 (thirty) days. Takes around the 1st or 2nd of month.    . Hydrocortisone (Deklin Bieler'S BUTT CREAM) CREA Apply 1 application topically as needed for irritation.    . insulin glargine (LANTUS) 100 UNIT/ML injection Inject 0.1 mLs (10 Units total) into the skin at bedtime. (Patient taking differently: Inject 20 Units into the skin at bedtime. ) 10 mL 5  . lisinopril (PRINIVIL,ZESTRIL) 10 MG tablet Take 1 tablet (10 mg total) by mouth daily. 90 tablet 3  . metFORMIN (GLUCOPHAGE) 500 MG tablet Take 1 tablet (500 mg total) by mouth 2 (two) times daily with a meal.    . metoCLOPramide (REGLAN) 5 MG/5ML solution Take 10 mLs (10 mg total) by mouth every 6 (six) hours. 473 mL 3  . metoprolol tartrate (LOPRESSOR) 25 mg/10 mL SUSP Take 5 mLs (12.5 mg total) by mouth 2 (two) times daily. 300 mL 3  . oxyCODONE (ROXICODONE) 5 MG/5ML solution Take 5 mLs (5 mg total) by mouth every 4 (four) hours as needed for severe pain. 473 mL 0  . pantoprazole sodium (PROTONIX) 40 mg/20 mL PACK Take 20 mLs (40 mg total) by mouth 2 (two) times daily. 60 each 3  . sennosides (SENOKOT) 8.8 MG/5ML syrup Take 10 mLs by mouth 2 (two) times daily. 240 mL 5  . simvastatin (ZOCOR) 40 MG tablet Take  40 mg by mouth daily.     No current facility-administered medications for this visit.     Review of Systems:     Cardiac Review of Systems: Y or N  Chest Pain [  y  ]  Resting SOB [ n  ] Exertional SOB  [ y ]  Orthopnea [  ]   Pedal Edema [   ]    Palpitations [  ] Syncope  [  ]   Presyncope [   ]  General Review of Systems: [Y] = yes [  ]=no Constitional: recent weight change [ gained wt ];  Wt loss over the last 3 months [ ]  anorexia [  ]; fatigue [ y ]; nausea [ y ]; night sweats [  ]; fever [  ]; or chills [  ];          Dental: poor dentition[ n ]; Last Dentist visit:   Eye : blurred vision [  ]; diplopia [   ]; vision changes [  ];  Amaurosis fugax[  ]; Resp: cough [  ];  wheezing[  ];  hemoptysis[ n ]; shortness of breath[ n ]; paroxysmal nocturnal dyspnea[  ]; dyspnea on exertion[ y ]; or orthopnea[  ];  GI:  gallstones[  ], vomiting[n  ];  dysphagia[y  ]; melena[  ];  hematochezia [ y ]; heartburn[ y ];   Hx of  Colonoscopy[  ]; GU: kidney stones [  ]; hematuria[  ];   dysuria [  ];  nocturia[  ];  history of     obstruction [  ]; urinary frequency [  ]             Skin: rash, swelling[  ];, hair loss[  ];  peripheral edema[  ];  or itching[  ]; Musculosketetal: myalgias[  ];  joint swelling[  ];  joint erythema[  ];  joint pain[  ];  back pain[  ];  Heme/Lymph: bruising[  ];  bleeding[n  ];  anemia[  ];  Neuro: TIA[  ];  headaches[  ];  stroke[  ];  vertigo[  ];  seizures[  ];   paresthesias[  ];  difficulty walking[ n ];  Psych:depression[  ]; anxiety[ y ];  Endocrine: diabetes[  ];  thyroid dysfunction[  ];  Immunizations: Flu up to date [ n ]; Pneumococcal up to date [facility]  ];  Other:  Physical Exam: BP 122/61 mmHg  Pulse 56  Resp 20  Ht 5\' 1"  (1.549 m)  Wt 158 lb (71.668 kg)  BMI 29.87 kg/m2  SpO2 91%   Wt Readings from Last 3 Encounters:  06/23/15 158 lb (71.668 kg)  06/17/15 157 lb 6.5 oz (71.4 kg)  05/06/15 157 lb 11.2 oz (71.532 kg)   weight up from  132  PHYSICAL EXAMINATION: BP 122/61 mmHg  Pulse 56  Resp 20  Ht 5\' 1"  (1.549 m)  Wt 158 lb (71.668 kg)  BMI 29.87 kg/m2  SpO2 91%   General appearance: alert, cooperative, appears older than stated age, cachectic and fatigued Neurologic: intact Heart: Appears to be in a regular rhythm today Lungs: diminished breath sounds bibasilar Abdomen: Abdomen mildly distended with intact G-tube in the left upper abdomen Extremities: extremities normal, atraumatic, no cyanosis or edema and Homans sign is negative, no sign of DVT Wound: There appears to be no infection around the I do not appreciate cervical or supraclavicular adenopathy  Her abdominal incision is well-healed, the left neck incisions healed without drainage on exam today, the jejunostomy tube is in place and secure   Diagnostic Studies & Laboratory data:     Recent Radiology Findings: Dg Chest 2 View  06/23/2015   CLINICAL DATA:  History of esophageal cancer, status post surgery 07/06/2015, status post radiation and chemotherapy  EXAM: CHEST  2 VIEW  COMPARISON:  06/15/2017  FINDINGS: Increased interstitial markings. No focal consolidation. Patchy left lower lobe opacity, likely atelectasis. Suspected small left pleural effusion.  Heart is normal in size.  Degenerative changes of the visualized thoracolumbar spine. Lumbar spine fixation hardware.  IMPRESSION: Patchy left lower lobe opacity, likely atelectasis. Suspected small left pleural effusion.   Electronically Signed   By: Julian Hy M.D.   On: 06/23/2015 10:52    07:34   Dg Esophagus W/water Sol Cm  06/16/2015   CLINICAL DATA:  69 year old female with history of esophageal cancer status post esophagectomy gastric pull-through, complicated by anastomotic leak. Followup study.  EXAM: ESOPHOGRAM/BARIUM SWALLOW  TECHNIQUE: Single contrast examination was performed using  water-soluble.  FLUOROSCOPY TIME:  If the device does not provide the exposure index:  Fluoroscopy  Time:  1 minutes and 6 seconds  Number of Acquired Images:  6 series  COMPARISON:  05/30/2015.  FINDINGS: Multiple swallows were observed, which again demonstrated postoperative anatomy of esophagectomy with gastric pull-through. The anastomosis immediately above the level of the aortic arch was more normal in appearance on today's examination. Specifically, no extravasation of contrast was noted at any point during today's study comment even during multiple  repeated swallows.  IMPRESSION: 1. Expected postoperative appearance of esophagectomy and gastric pull through, with no evidence of residual anastomotic leak.   Electronically Signed   By: Vinnie Langton M.D.   On: 06/16/2015 08:53   Nm Pet Image Restag (ps) Skull Base To Thigh  03/24/2015   CLINICAL DATA:  Subsequent treatment strategy for esophageal cancer.  EXAM: NUCLEAR MEDICINE PET SKULL BASE TO THIGH  TECHNIQUE: 7.4 mCi F-18 FDG was injected intravenously. Full-ring PET imaging was performed from the skull base to thigh after the radiotracer. CT data was obtained and used for attenuation correction and anatomic localization.  FASTING BLOOD GLUCOSE:  Value: 219 mg/dl  COMPARISON:  CTs of the neck, abdomen and pelvis 03/07/2015. Chest CT 11/29/2014.  FINDINGS: NECK  No hypermetabolic cervical lymph nodes are identified.There are no lesions of the pharyngeal mucosal space. There is low-level activity associated with the muscles of phonation, within physiologic limits.  CHEST  There are no hypermetabolic mediastinal, hilar or axillary lymph nodes. There is focal hypermetabolic activity at the gastroesophageal junction, extending over a short segment. This has an SUV max of 5.5. Underlying wall thickening in this region appears grossly stable without well-defined mass. There is no suspicious pulmonary activity or suspicious pulmonary nodule. Mild emphysema, basilar interstitial prominence and small pleural effusions are noted.  ABDOMEN/PELVIS  There is no  hypermetabolic activity within the liver, adrenal glands, spleen or pancreas. There is no hypermetabolic nodal activity. Pancreatic atrophy and aortoiliac atherosclerosis noted.  SKELETON  There is no hypermetabolic activity to suggest osseous metastatic disease. There are postsurgical changes within the lumbar spine.  IMPRESSION: 1. Nonspecific short segment hypermetabolic activity at the gastroesophageal junction may be related to treated tumor and/or radiation change. 2. No evidence of metastatic disease. There is no abnormal activity within the liver or adjacent lymph nodes.   Electronically Signed   By: Richardean Sale M.D.   On: 03/24/2015 10:20  I have independently reviewed the above radiology studies  and reviewed the findings with the patient.  Final Report  03/07/2015  CLINICAL DATA: Esophageal cancer. Chemotherapy and radiation therapy completed August 2015.  EXAM: CT ABDOMEN AND PELVIS WITH CONTRAST  TECHNIQUE: Multidetector CT imaging of the abdomen and pelvis was performed using the standard protocol following bolus administration of intravenous contrast.  CONTRAST: 100 mL Isovue  COMPARISON: PET-CT scan 04/21/2014, CT scan 11/16/2014  FINDINGS: Lower chest: Lung bases are clear.  Hepatobiliary: No focal hepatic lesion. The gallbladder is collapsed.  Pancreas: Pancreas is normal. No ductal dilatation. No pancreatic inflammation.  Spleen: Normal spleen  Adrenals/urinary tract: Mild nodularity of adrenal glands is unchanged. Kidneys are normal. The ureters and bladder normal.  Stomach/Bowel: There is thickening of the distal esophagus similar to prior. Stomach, small bowel, and colon are unremarkable.  Vascular/Lymphatic: Atherosclerotic calcification aorta. No periportal retroperitoneal lymphadenopathy. No gastrohepatic ligament lymphadenopathy. No pelvic adenopathy or inguinal adenopathy.  Reproductive: Post hysterectomy.  Musculoskeletal: No aggressive  osseous lesion. Posterior lumbar fusion  Other: No peritoneal metastasis.  IMPRESSION: 1. No evidence of esophageal cancer recurrence or metastasis in the abdomen pelvis. 2. Stable thickening distal esophagus likely relates radiation change. 3. Atherosclerotic calcification of the aorta.   Electronically Signed By: Suzy Bouchard M.D. On: 03/07/2015 16:43    CLINICAL DATA: Shortness of breath; history of esophageal malignancy, PEG tube placement, appendectomy, hysterectomy, and lumbar fusion.  EXAM: CT ANGIOGRAPHY CHEST  CT ABDOMEN AND PELVIS WITH CONTRAST  TECHNIQUE: Multidetector CT imaging of the chest was performed using  the standard protocol during bolus administration of intravenous contrast. Multiplanar CT image reconstructions and MIPs were obtained to evaluate the vascular anatomy. Multidetector CT imaging of the abdomen and pelvis was performed using the standard protocol during bolus administration of intravenous contrast.  CONTRAST: 100 cc of Isovue 370 intravenously. The patient also received oral contrast material through the PEG tube.  COMPARISON: Portable chest x-ray of today's date and PET-CT study of April 21, 2014 and CT scan of the chest and abdomen abdomen dated March 31, 2014  FINDINGS: CTA CHEST FINDINGS  There are small filling defects within peripheral pulmonary arterial branches to the right lower lobe. There are no filling defects elsewhere on the right nor on the left. The caliber of the thoracic aorta is normal. The cardiac chambers are normal in size. The RV LV ratio is approximately 1. There is no pericardial effusion. There is no lymphadenopathy. There is thickening of the wall of the lower 1/3 of the esophagus without visible discrete mass. There is no free mediastinal fluid or air.  At lung window settings there is no pulmonary parenchymal mass. There is increased density in the posterior medial aspect of the right costophrenic  gutter which may reflect pneumonia or pulmonary infarction new since the previous studies. There is no pleural effusion nor pulmonary parenchymal mass.  There are degenerative changes of the lower thoracic discs. There is no compression fracture. The sternum and observed ribs exhibit no acute abnormalities.  CT ABDOMEN and PELVIS FINDINGS  The liver, gallbladder, pancreas, spleen, right adrenal gland, and kidneys are normal. There is stable mild enlargement of the left adrenal gland. There is no bulky periaortic or pericaval lymphadenopathy. The abdominal aorta exhibits mural thrombus and calcified plaque, but no evidence of aneurysm. The stomach contains a PEG tube and is of partially distended with gas. The GE junction region remains prominent. No perigastric lymphadenopathy is demonstrated. The small bowel is normal. There is a moderate stool burden within the colon without evidence of obstruction. The rectum is mildly distended with gas and stool. The urinary bladder is unremarkable. The uterus is surgically absent. The patient has undergone previous posterior fusion at L3-4 and L4-5. The vertebral bodies are preserved in height. The bony pelvis is unremarkable.  Review of the MIP images confirms the above findings.  IMPRESSION: 1. There are small emboli within branches of the right lower lobe pulmonary artery posteriorly without evidence of significant right heart strain. There is small amount of adjacent presumed infarct versus pneumonia. 2. There is mild thickening of the wall of the distal third of the esophagus without evidence of a discrete mass or perforation. 3. There is no evidence of CHF nor pulmonary parenchymal masses. There is no pleural effusion. 4. No acute intra-abdominal abnormality is demonstrated. There are atherosclerotic changes of the abdominal aorta without evidence of dissection or aneurysm. There is stable mild enlargement of the left adrenal  gland. 5. These results were called by telephone at the time of interpretation on 07/21/2014 at 12:59 pm to Dr. Isla Pence MD, who verbally acknowledged these results.   Electronically Signed By: David Martinique On: 07/21/2014 12:59   CLINICAL DATA: Initial treatment strategy for esophageal carcinoma.  EXAM: NUCLEAR MEDICINE PET SKULL BASE TO THIGH  TECHNIQUE: 11.0 mCi F-18 FDG was injected intravenously. Full-ring PET imaging was performed from the skull base to thigh after the radiotracer. CT data was obtained and used for attenuation correction and anatomic localization.  FASTING BLOOD GLUCOSE: Value: 73 mg/dl  COMPARISON:  CT 03/31/2014  FINDINGS: NECK  No hypermetabolic lymph nodes in the neck.  CHEST  There is a long segment of intense hypermetabolic activity associated with the esophagus. This extends from relate just superior to the carina through the GE junction with SUV max equal 8.5. There is a hypermetabolic mass at the esophageal junction extending to the gastric cardia with SUV max equals 17.3. No hypermetabolic mediastinal lymph nodes. No suspicious pulmonary nodules.  ABDOMEN/PELVIS  Hypermetabolic mass in the gastric cardia described above. There is no hypermetabolic gastro hepatic ligament lymph nodes. No abnormal metabolic activity within the liver.  No hypermetabolic abdominal pelvic lymph nodes  SKELETON  No focal hypermetabolic activity to suggest skeletal metastasis.  IMPRESSION: 1. Hypermetabolic mass in the gastric cardia and esophageal junction consists with primary esophageal / gastric carcinoma. 2. Long segment intense metabolic activity associated with the mid and distal esophagus. Differential includes esophageal carcinoma versus esophagitis. 3. No evidence of hypermetabolic mediastinal or gastrohepatic ligament lymph nodes. 4. No evidence of liver metastasis. 5. No distant metastasis   Electronically Signed By: Suzy Bouchard M.D. On: 04/21/2014 13:57    CLINICAL DATA: Difficulty swallowing and epigastric pain. Esophageal disorder. Prior appendectomy and hysterectomy.  EXAM: CT CHEST AND ABDOMEN WITH CONTRAST  TECHNIQUE: Multidetector CT imaging of the chest and abdomen was performed following the standard protocol during bolus administration of intravenous contrast.  CONTRAST: 80 cc Isovue 370  COMPARISON: Abdominal pelvic CT 02/10/2014. Chest radiograph 04/07/2013. Chest CT 04/23/2008.  FINDINGS: CT CHEST FINDINGS  Lungs/Pleura: No nodules or airspace opacities. No pleural fluid.  Heart/Mediastinum: No supraclavicular adenopathy. Mildly age advanced aortic and branch vessel atherosclerosis. Normal heart size with lipomatous hypertrophy of the interatrial septum. Multivessel coronary artery atherosclerosis. No mediastinal or hilar adenopathy. Air contrast level in the thoracic esophagus on image 28 of series 2 and more superiorly on image 19/series 2.  CT ABDOMEN FINDINGS  Abdomen: Normal liver, spleen. Redemonstration of soft tissue fullness involving the gastroesophageal junction and proximal stomach. Example images 37-42 of series 2. Suspect fluid/gastric contents with soft tissue density in the gastric cardia/body junction on image 39/series 2.  Normal pancreas, gallbladder, biliary tract. Mild right adrenal thickening and left adrenal nodularity are grossly similar back to 2009. Suspect an underlying left adrenal adenoma at 1.3 cm. 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 Normal right kidney. Advanced aortic and branch vessel atherosclerosis. Ulcerative plaque within the infrarenal aorta, including on image 62/series 2. Beam hardening artifact from spinal hardware. No retroperitoneal or retrocrural adenopathy. Normal colon and terminal ileum. Normal abdominal small bowel without ascites. No evidence of omental or peritoneal  disease.  Bones/Musculoskeletal: L3-5 lumbar spine fixation. Lower thoracic degenerative disc disease.  IMPRESSION: CT CHEST IMPRESSION  1. No acute process in the chest. 2. Age advanced coronary artery atherosclerosis. Recommend assessment of coronary risk factors and consideration of medical therapy. 3. Mildly dilated thoracic esophagus with contrast within. This suggests a component of esophageal obstruction, dysmotility, or gastroesophageal reflux disease.  CT ABDOMEN AND PELVIS IMPRESSION  1. Redemonstration of soft tissue fullness at the distal esophagus and proximal stomach. Cannot exclude gastritis or gastroesophageal carcinoma. If not already performed, endoscopy is recommended. 2. Advanced abdominal aortic and branch vessel atherosclerosis.   Electronically Signed By: Abigail Miyamoto M.D. On: 03/31/2014 14:14   Pretreatment EUS: Endoscopic findings: 1. Malignant mass in distal esophagus. The mass was circumferential, patially obstructing but I was able to fairly easily advance echoendoscope through the strictured lumen. The proximal edge was  34 cm from the incisors and distal edge (just below the GE junction) was at 39 cm. The mass is 5cm long. EUS findings: 1. The mass above corresponded with a hypoechoic, heterogeneous mass that clearly passes into and through the muscularis propria layer of the esophageal wall (uT3). 2. There were two round, well demarcated, 5-38mm, hypoechoic paraesophageal lymphnodes that lay directly adjacent to the mass that are suspicious for malignant involvement (uN1). 3. No celiac adenopathy. Impression: uT3N1 (clinical stage IIIa) 5cm long, cirumferential GE junction adenocarcinoma with proximal edge at 34cm from incisors and distal edge just below the GE junction.   Post Treatment EUS: Endoscopic findings: 1. The previously noted (2015) GE junction malignancy was smaller now, but still clearly present. It was 2-3cm  long, non-circumferential, ulcerated, located at GE junction (37cm from incisors). EUS findings: 1. The mass above correlated with a hypoechoic, heterogeneous lesion that clearly passed into and through the muscularis propria layer of the distal esophagus, GE junction wall (uT3). 2. The was no paraesophageal, mediastinal, celiac adenopathy (uN0). ENDOSCOPIC IMPRESSION: uT3N0 (stage IIa) non-circumferential, 2-3cm long, ulcerated GE junction adenocarcinoma. This appears to have responded to neoadjuvant chemo (was previously staged IIIa) and so she may be a candidate for surgical resection   Recent Lab Findings: Lab Results  Component Value Date   WBC 12.3* 06/16/2015      Assessment / Plan:   Patient status post transhiatal total esophagectomy with cervical esophagogastrostomy, postoperatively complicated by anastomotic leak now healed She seems to be making progress with her current diet Final pathologic stage IIb.  I plan to see her back in 3-4 weeks She'll continue on the current nutrition plan taking a soft diet as tolerated with supplemental feedings at night.  Grace Isaac MD      [address] [place] [address] Office [phone]   Beeper [phone]  06/23/2015 1:07 PM

## 2015-06-24 ENCOUNTER — Ambulatory Visit (INDEPENDENT_AMBULATORY_CARE_PROVIDER_SITE_OTHER): Payer: Self-pay | Admitting: Cardiothoracic Surgery

## 2015-06-24 ENCOUNTER — Other Ambulatory Visit: Payer: Self-pay

## 2015-06-24 ENCOUNTER — Encounter: Payer: Self-pay | Admitting: Cardiothoracic Surgery

## 2015-06-24 VITALS — BP 138/72 | HR 49 | Resp 20 | Ht 61.0 in | Wt 158.0 lb

## 2015-06-24 DIAGNOSIS — C159 Malignant neoplasm of esophagus, unspecified: Secondary | ICD-10-CM

## 2015-06-24 NOTE — Progress Notes (Signed)
[address]       [place] [address]             [phone]      Sharon Price Sombrillo Medical Record #[id number] Date of Birth: 09-Mar-1946  Referring: Lillard Anes,* Primary Care: Lillard Anes, MD  Chief Complaint:   POST OP FOLLOW UP  History of Present Illness:     Feeding tube blocked      Past Medical History  Diagnosis Date  . Chronic kidney disease     renal..STAGE 2  . Hypertension   . Mechanical dysphagia     MOSTLY SOLIDS  . Pulmonary emboli 9/223/15 [place]    RIGHT LOWER LOBE PER CT CHEST   . Patient on combined chemotherapy and radiation     [facility]  . Cancer 03/30/14    GE JUNCTION  . Transfusion history     ?[facility].  . Family history of adverse reaction to anesthesia     Patients sister is very nauseated after anesthesia  . Pneumonia   . Diabetes mellitus without complication     Type 2  . Hyperlipemia   . GERD (gastroesophageal reflux disease)      History  Smoking status  . Former Smoker -- 1.00 packs/day for 20 years  . Quit date: 03/02/2015  Smokeless tobacco  . Not on file    Comment: 3 weeks- < 1ppd.    History  Alcohol Use No     No Known Allergies  Current Outpatient Prescriptions  Medication Sig Dispense Refill  . acetaminophen (TYLENOL) 160 MG/5ML solution Take 10.2 mLs (325 mg total) by mouth every 4 (four) hours as needed for moderate pain or headache. 473 mL 1  . Albuterol Sulfate (PROAIR RESPICLICK IN) Inhale 1 puff into the lungs as needed (for coughing/ shortness of breath).     Marland Kitchen amiodarone (CORDARONE) 5 mg/mL SUSP Take 40 mLs (200 mg total) by mouth daily. 250 mL 1  . apixaban (ELIQUIS) 5 MG TABS tablet Take 1 tablet (5 mg total) by mouth 2 (two) times daily. 60 tablet 1  . chlorpheniramine-HYDROcodone (TUSSIONEX) 10-8 MG/5ML SUER Place 5 mLs into feeding tube every 12 (twelve) hours as needed for cough.  140 mL 0  . ergocalciferol (VITAMIN D2) 50000 UNITS capsule Take 50,000 Units by mouth every 30 (thirty) days. Takes around the 1st or 2nd of month.    . Hydrocortisone (GERHARDT'S BUTT CREAM) CREA Apply 1 application topically as needed for irritation.    . insulin glargine (LANTUS) 100 UNIT/ML injection Inject 0.1 mLs (10 Units total) into the skin at bedtime. (Patient taking differently: Inject 20 Units into the skin at bedtime. ) 10 mL 5  . lisinopril (PRINIVIL,ZESTRIL) 10 MG tablet Take 1 tablet (10 mg total) by mouth daily. 90 tablet 3  . metFORMIN (GLUCOPHAGE) 500 MG tablet Take 1 tablet (500 mg total) by mouth 2 (two) times daily with a meal.    . metoCLOPramide (REGLAN) 5 MG/5ML solution Take 10 mLs (10 mg total) by mouth every 6 (six) hours. 473 mL 3  . metoprolol tartrate (LOPRESSOR) 25 mg/10 mL SUSP Take 5 mLs (12.5 mg total) by mouth 2 (two) times daily. 300 mL 3  . oxyCODONE (ROXICODONE) 5 MG/5ML solution Take 5 mLs (5 mg total) by mouth every 4 (four) hours as needed for severe pain. 473 mL 0  . pantoprazole sodium (PROTONIX) 40 mg/20 mL  PACK Take 20 mLs (40 mg total) by mouth 2 (two) times daily. 60 each 3  . sennosides (SENOKOT) 8.8 MG/5ML syrup Take 10 mLs by mouth 2 (two) times daily. 240 mL 5  . simvastatin (ZOCOR) 40 MG tablet Take 40 mg by mouth daily.     No current facility-administered medications for this visit.       Physical Exam: BP 138/72 mmHg  Pulse 49  Resp 20  Ht 5\' 1"  (1.549 m)  Wt 158 lb (71.668 kg)  BMI 29.87 kg/m2  SpO2 97%     Diagnostic Studies & Laboratory data:     Recent Radiology Findings:   Dg Chest 2 View  06/23/2015   CLINICAL DATA:  History of esophageal cancer, status post surgery 07/06/2015, status post radiation and chemotherapy  EXAM: CHEST  2 VIEW  COMPARISON:  06/15/2017  FINDINGS: Increased interstitial markings. No focal consolidation. Patchy left lower lobe opacity, likely atelectasis. Suspected small left pleural effusion.   Heart is normal in size.  Degenerative changes of the visualized thoracolumbar spine. Lumbar spine fixation hardware.  IMPRESSION: Patchy left lower lobe opacity, likely atelectasis. Suspected small left pleural effusion.   Electronically Signed   By: Julian Hy M.D.   On: 06/23/2015 10:52      Recent Lab Findings: Lab Results  Component Value Date   WBC 12.3* 06/16/2015   HGB 11.3* 06/16/2015   HCT 34.0* 06/16/2015   PLT 417* 06/16/2015   GLUCOSE 121* 06/16/2015   ALT 49 06/16/2015   AST 50* 06/16/2015   NA 137 06/16/2015   K 4.9 06/16/2015   CL 100* 06/16/2015   CREATININE 0.87 06/16/2015   BUN 31* 06/16/2015   CO2 29 06/16/2015   INR 1.37 05/12/2015   HGBA1C 8.0* 05/10/2015      Assessment / Plan:     Un Clogged j feeding tube, un clotted with flexible guide wire and instillation of coke. Flushes without difficlity asymptomatic bradycardia , decrease Cordarone to 100 mg day d/c later if no recurrent afib/flutter      Grace Isaac MD      [address] [place] [address] Office [phone]   Beeper [phone]  06/24/2015 1:25 PM

## 2015-07-21 ENCOUNTER — Ambulatory Visit (INDEPENDENT_AMBULATORY_CARE_PROVIDER_SITE_OTHER): Payer: Self-pay | Admitting: Cardiothoracic Surgery

## 2015-07-21 ENCOUNTER — Encounter: Payer: Self-pay | Admitting: Cardiothoracic Surgery

## 2015-07-21 VITALS — BP 143/68 | HR 76 | Resp 16 | Ht 61.0 in | Wt 163.0 lb

## 2015-07-21 DIAGNOSIS — C159 Malignant neoplasm of esophagus, unspecified: Secondary | ICD-10-CM

## 2015-07-21 MED ORDER — OXYCODONE HCL 5 MG/5ML PO SOLN: 5.0000 mg | ORAL | Status: AC | PRN

## 2015-07-21 MED ORDER — PROMETHAZINE HCL 12.5 MG PO TABS: 12.5000 mg | ORAL_TABLET | Freq: Three times a day (TID) | ORAL | Status: AC | PRN

## 2015-07-21 MED ORDER — AMIODARONE HCL 100 MG PO TABS: 100.0000 mg | ORAL_TABLET | Freq: Every day | ORAL | Status: AC

## 2015-07-21 NOTE — Progress Notes (Signed)
[address]       [place] [address]             [place] Record #[id number] Date of Birth: September 16, 1946  Referring: Marice Potter, MD Primary Care: Lillard Anes, MD  Chief Complaint:    Chief Complaint  Patient presents with  . Follow-up    4 wk f/u   05/10/2015 DATE OF DISCHARGE:  OPERATIVE REPORT PREOPERATIVE DIAGNOSIS: Adenocarcinoma of the distal esophagus. POSTOPERATIVE DIAGNOSIS: Adenocarcinoma of the distal esophagus. PROCEDURE: Video bronchoscopy, transhiatal total esophagectomy with cervical esophagogastrostomy, pyloroplasty, feeding jejunostomy. SURGEON: Lanelle Bal, MD.  Esophageal cancer   Staging form: Esophagus - Adenocarcinoma, AJCC 7th Edition     Pathologic stage from 05/13/2015: Stage IIB (T3, N0, cM0, G3 - Poorly differentiated) - Signed by Grace Isaac, MD on 05/16/2015       History of Present Illness:    Sharon Price 69 y.o. female is seen in the office in follow-up after resection of her adenocarcinoma the distal esophagus. She underwent surgery on June 12, did develop a cervical anastomotic leak which gradually healed and has now closed. She is now at home on jejunal tube feedings at night and soft diet during the day. Since discharge she's had no fever chills and appears to be progressing well. Recently she has been gaining weight.   Originally she was diagnosed with clinical stage IIIa T3 N1 MO esophageal cancer for an ulcerated adenocarcinoma [place] pathology [phone]. The patient has completed neoadjuvant chemotherapy radiation with weekly carboplatin and and paclltaxel in AUG 2015.   Patient is no longer smoking.  Patient continues on request for history of pulmonary embolus in the fall of 2015  Patient describes some bile reflux early in the morning when she first gets up.  Current Activity/ Functional Status:  Patient is  independent with mobility/ambulation, transfers, ADL's, IADL's.   Zubrod Score: At the time of surgery this patient's most appropriate activity status/level should be described as: []     0    Normal activity, no symptoms [x]     1    Restricted in physical strenuous activity but ambulatory, able to do out light work []     2    Ambulatory and capable of self care, unable to do work activities, up and about               >50 % of waking hours                              []     3    Only limited self care, in bed greater than 50% of waking hours []     4    Completely disabled, no self care, confined to bed or chair []     5    Moribund   Past Medical History  Diagnosis Date  . Chronic kidney disease     renal..STAGE 2  . Hypertension   . Mechanical dysphagia     MOSTLY SOLIDS  . Pulmonary emboli 9/223/15 [place]    RIGHT LOWER LOBE PER CT CHEST   . Patient on combined chemotherapy and radiation     [facility]  . Cancer 03/30/14    GE JUNCTION  . Transfusion  history     ?[facility].  . Family history of adverse reaction to anesthesia     Patients sister is very nauseated after anesthesia  . Pneumonia   . Diabetes mellitus without complication     Type 2  . Hyperlipemia   . GERD (gastroesophageal reflux disease)     Past Surgical History  Procedure Laterality Date  . Back surgery    . Abdominal hysterectomy    . Tubal ligation    . Eus N/A 04/08/2014    Procedure: ESOPHAGEAL ENDOSCOPIC ULTRASOUND (EUS) RADIAL;  Surgeon: Milus Banister, MD;  Location: [facility];  Service: Endoscopy;  Laterality: N/A;  . Appendectomy    . Spine surgery    . Lumbar fusion    . Esophagogastroduodenoscopy (egd) with propofol N/A 12/02/2014    Procedure: ESOPHAGOGASTRODUODENOSCOPY (EGD) WITH PROPOFOL;  Surgeon: Jerene Bears, MD;  Location: [facility];  Service: Endoscopy;  Laterality: N/A;  . Eus N/A 03/31/2015    Procedure: UPPER  ENDOSCOPIC ULTRASOUND (EUS) RADIAL;  Surgeon: Milus Banister, MD;  Location: [facility];  Service: Endoscopy;  Laterality: N/A;  . Cataract extraction Bilateral   . Video bronchoscopy N/A 05/10/2015    Procedure: VIDEO BRONCHOSCOPY;  Surgeon: Grace Isaac, MD;  Location: [place];  Service: Thoracic;  Laterality: N/A;  . Complete esophagectomy N/A 05/10/2015    Procedure: TRANSHIATAL TOTAL ESOPHAGEAL RESECTION;  Surgeon: Grace Isaac, MD;  Location: [place];  Service: Thoracic;  Laterality: N/A;  . Jejunostomy N/A 05/10/2015    Procedure: FEEDING JEJUNOSTOMY;  Surgeon: Grace Isaac, MD;  Location: [place];  Service: Thoracic;  Laterality: N/A;  . Mass excision Left 05/20/2015    Procedure: EXPLORATION OF LEFT NECK;  Surgeon: Grace Isaac, MD;  Location: [place];  Service: Thoracic;  Laterality: Left;    Family History  Problem Relation Age of Onset  . Diabetes Mother   . Hypertension Mother   . Stroke Mother   . Heart disease Mother   . Hypertension Father   . Diabetes Sister   . Cancer Sister     THYROID  . Cancer Brother     LUNG  . Diabetes Daughter   . Diabetes Son     Social History   Social History  . Marital Status: Widowed    Spouse Name: N/A  . Number of Children: N/A  . Years of Education: N/A   Occupational History  . Not on file.   Social History Main Topics  . Smoking status: Former Smoker -- 1.00 packs/day for 20 years    Quit date: 03/02/2015  . Smokeless tobacco: Not on file     Comment: 3 weeks- < 1ppd.  . Alcohol Use: No  . Drug Use: No  . Sexual Activity: Not on file   Other Topics Concern  . Not on file   Social History Narrative    History  Smoking status  . Former Smoker -- 1.00 packs/day for 20 years  . Quit date: 03/02/2015  Smokeless tobacco  . Not on file    Comment: 3 weeks- < 1ppd.    History  Alcohol Use No     No Known Allergies  Current Outpatient Prescriptions  Medication Sig Dispense Refill  .  acetaminophen (TYLENOL) 160 MG/5ML solution Take 10.2 mLs (325 mg total) by mouth every 4 (four) hours as needed for moderate pain or headache. 473 mL 1  . Albuterol Sulfate (PROAIR RESPICLICK IN) Inhale 1 puff into  the lungs as needed (for coughing/ shortness of breath).     Marland Kitchen amiodarone (CORDARONE) 5 mg/mL SUSP Take 100 mg by mouth daily.    Marland Kitchen amiodarone (PACERONE) 100 MG tablet Take 1 tablet (100 mg total) by mouth daily. 30 tablet 1  . apixaban (ELIQUIS) 5 MG TABS tablet Take 1 tablet (5 mg total) by mouth 2 (two) times daily. 60 tablet 1  . chlorpheniramine-HYDROcodone (TUSSIONEX) 10-8 MG/5ML SUER Place 5 mLs into feeding tube every 12 (twelve) hours as needed for cough. 140 mL 0  . ergocalciferol (VITAMIN D2) 50000 UNITS capsule Take 50,000 Units by mouth every 30 (thirty) days. Takes around the 1st or 2nd of month.    . Hydrocortisone (GERHARDT'S BUTT CREAM) CREA Apply 1 application topically as needed for irritation.    . insulin glargine (LANTUS) 100 UNIT/ML injection Inject 0.1 mLs (10 Units total) into the skin at bedtime. (Patient taking differently: Inject 20 Units into the skin at bedtime. ) 10 mL 5  . lisinopril (PRINIVIL,ZESTRIL) 10 MG tablet Take 1 tablet (10 mg total) by mouth daily. 90 tablet 3  . metFORMIN (GLUCOPHAGE) 500 MG tablet Take 1 tablet (500 mg total) by mouth 2 (two) times daily with a meal.    . metoCLOPramide (REGLAN) 5 MG/5ML solution Take 10 mLs (10 mg total) by mouth every 6 (six) hours. 473 mL 3  . metoprolol tartrate (LOPRESSOR) 25 mg/10 mL SUSP Take 5 mLs (12.5 mg total) by mouth 2 (two) times daily. 300 mL 3  . oxyCODONE (ROXICODONE) 5 MG/5ML solution Take 5 mLs (5 mg total) by mouth every 4 (four) hours as needed for severe pain. 473 mL 0  . pantoprazole sodium (PROTONIX) 40 mg/20 mL PACK Take 20 mLs (40 mg total) by mouth 2 (two) times daily. 60 each 3  . sennosides (SENOKOT) 8.8 MG/5ML syrup Take 10 mLs by mouth 2 (two) times daily. 240 mL 5  . simvastatin  (ZOCOR) 40 MG tablet Take 40 mg by mouth daily.    . promethazine (PHENERGAN) 12.5 MG tablet Take 1 tablet (12.5 mg total) by mouth every 8 (eight) hours as needed for nausea, vomiting or refractory nausea / vomiting. 30 tablet 0   No current facility-administered medications for this visit.     Review of Systems:     Cardiac Review of Systems: Y or N  Chest Pain [  y  ]  Resting SOB [ n  ] Exertional SOB  [ y ]  Orthopnea [  ]   Pedal Edema [   ]    Palpitations [  ] Syncope  [  ]   Presyncope [   ]  General Review of Systems: [Y] = yes [  ]=no Constitional: recent weight change [ gained wt ];  Wt loss over the last 3 months [ ]  anorexia [  ]; fatigue [ y ]; nausea [ y ]; night sweats [  ]; fever [  ]; or chills [  ];          Dental: poor dentition[ n ]; Last Dentist visit:   Eye : blurred vision [  ]; diplopia [   ]; vision changes [  ];  Amaurosis fugax[  ]; Resp: cough [  ];  wheezing[  ];  hemoptysis[ n ]; shortness of breath[ n ]; paroxysmal nocturnal dyspnea[  ]; dyspnea on exertion[ y ]; or orthopnea[  ];  GI:  gallstones[  ], vomiting[n  ];  dysphagia[y  ]; melena[  ];  hematochezia [ y ]; heartburn[ y ];   Hx of  Colonoscopy[  ]; GU: kidney stones [  ]; hematuria[  ];   dysuria [  ];  nocturia[  ];  history of     obstruction [  ]; urinary frequency [  ]             Skin: rash, swelling[  ];, hair loss[  ];  peripheral edema[  ];  or itching[  ]; Musculosketetal: myalgias[  ];  joint swelling[  ];  joint erythema[  ];  joint pain[  ];  back pain[  ];  Heme/Lymph: bruising[  ];  bleeding[n  ];  anemia[  ];  Neuro: TIA[  ];  headaches[  ];  stroke[  ];  vertigo[  ];  seizures[  ];   paresthesias[  ];  difficulty walking[ n ];  Psych:depression[  ]; anxiety[ y ];  Endocrine: diabetes[  ];  thyroid dysfunction[  ];  Immunizations: Flu up to date [ n ]; Pneumococcal up to date Fl[facility]ncio.Farrier  ];  Other:  Physical Exam: BP 143/68 mmHg  Pulse 76  Resp 16  Ht 5\' 1"  (1.549 m)  Wt 163 lb  (73.936 kg)  BMI 30.81 kg/m2  SpO2 97%   Wt Readings from Last 3 Encounters:  07/21/15 163 lb (73.936 kg)  06/24/15 158 lb (71.668 kg)  06/23/15 158 lb (71.668 kg)   weight up from 132  PHYSICAL EXAMINATION: BP 143/68 mmHg  Pulse 76  Resp 16  Ht 5\' 1"  (1.549 m)  Wt 163 lb (73.936 kg)  BMI 30.81 kg/m2  SpO2 97%   General appearance: alert, cooperative, appears older than stated age, cachectic and fatigued Neurologic: intact Heart: Appears to be in a regular rhythm today Lungs: diminished breath sounds bibasilar Abdomen: Abdomen mildly distended with intact G-tube in the left upper abdomen Extremities: extremities normal, atraumatic, no cyanosis or edema and Homans sign is negative, no sign of DVT Wound:Abdominal incision is well-healed , the left neck incision is also well-healed without evidence of drainage  I do not appreciate cervical or supraclavicular adenopathy  Her abdominal incision is well-healed, the left neck incisions healed without drainage on exam today, the jejunostomy tube is in place and secure   Diagnostic Studies & Laboratory data:     Recent Radiology Findings: Dg Chest 2 View  06/23/2015   CLINICAL DATA:  History of esophageal cancer, status post surgery 07/06/2015, status post radiation and chemotherapy  EXAM: CHEST  2 VIEW  COMPARISON:  06/15/2017  FINDINGS: Increased interstitial markings. No focal consolidation. Patchy left lower lobe opacity, likely atelectasis. Suspected small left pleural effusion.  Heart is normal in size.  Degenerative changes of the visualized thoracolumbar spine. Lumbar spine fixation hardware.  IMPRESSION: Patchy left lower lobe opacity, likely atelectasis. Suspected small left pleural effusion.   Electronically Signed   By: Julian Hy M.D.   On: 06/23/2015 10:52   Dg Chest 2 View  06/23/2015   CLINICAL DATA:  History of esophageal cancer, status post surgery 07/06/2015, status post radiation and chemotherapy  EXAM: CHEST   2 VIEW  COMPARISON:  06/15/2017  FINDINGS: Increased interstitial markings. No focal consolidation. Patchy left lower lobe opacity, likely atelectasis. Suspected small left pleural effusion.  Heart is normal in size.  Degenerative changes of the visualized thoracolumbar spine. Lumbar spine fixation hardware.  IMPRESSION: Patchy left lower lobe opacity, likely atelectasis. Suspected small left pleural effusion.   Electronically Signed   By: Bertis Ruddy  [place] Pink M.D.   On: 06/23/2015 10:52    07:34   Dg Esophagus W/water Sol Cm  06/16/2015   CLINICAL DATA:  69 year old female with history of esophageal cancer status post esophagectomy gastric pull-through, complicated by anastomotic leak. Followup study.  EXAM: ESOPHOGRAM/BARIUM SWALLOW  TECHNIQUE: Single contrast examination was performed using  water-soluble.  FLUOROSCOPY TIME:  If the device does not provide the exposure index:  Fluoroscopy Time:  1 minutes and 6 seconds  Number of Acquired Images:  6 series  COMPARISON:  05/30/2015.  FINDINGS: Multiple swallows were observed, which again demonstrated postoperative anatomy of esophagectomy with gastric pull-through. The anastomosis immediately above the level of the aortic arch was more normal in appearance on today's examination. Specifically, no extravasation of contrast was noted at any point during today's study comment even during multiple repeated swallows.  IMPRESSION: 1. Expected postoperative appearance of esophagectomy and gastric pull through, with no evidence of residual anastomotic leak.   Electronically Signed   By: Vinnie Langton M.D.   On: 06/16/2015 08:53   Nm Pet Image Restag (ps) Skull Base To Thigh  03/24/2015   CLINICAL DATA:  Subsequent treatment strategy for esophageal cancer.  EXAM: NUCLEAR MEDICINE PET SKULL BASE TO THIGH  TECHNIQUE: 7.4 mCi F-18 FDG was injected intravenously. Full-ring PET imaging was performed from the skull base to thigh after the radiotracer. CT data was  obtained and used for attenuation correction and anatomic localization.  FASTING BLOOD GLUCOSE:  Value: 219 mg/dl  COMPARISON:  CTs of the neck, abdomen and pelvis 03/07/2015. Chest CT 11/29/2014.  FINDINGS: NECK  No hypermetabolic cervical lymph nodes are identified.There are no lesions of the pharyngeal mucosal space. There is low-level activity associated with the muscles of phonation, within physiologic limits.  CHEST  There are no hypermetabolic mediastinal, hilar or axillary lymph nodes. There is focal hypermetabolic activity at the gastroesophageal junction, extending over a short segment. This has an SUV max of 5.5. Underlying wall thickening in this region appears grossly stable without well-defined mass. There is no suspicious pulmonary activity or suspicious pulmonary nodule. Mild emphysema, basilar interstitial prominence and small pleural effusions are noted.  ABDOMEN/PELVIS  There is no hypermetabolic activity within the liver, adrenal glands, spleen or pancreas. There is no hypermetabolic nodal activity. Pancreatic atrophy and aortoiliac atherosclerosis noted.  SKELETON  There is no hypermetabolic activity to suggest osseous metastatic disease. There are postsurgical changes within the lumbar spine.  IMPRESSION: 1. Nonspecific short segment hypermetabolic activity at the gastroesophageal junction may be related to treated tumor and/or radiation change. 2. No evidence of metastatic disease. There is no abnormal activity within the liver or adjacent lymph nodes.   Electronically Signed   By: Richardean Sale M.D.   On: 03/24/2015 10:20  I have independently reviewed the above radiology studies  and reviewed the findings with the patient.  Final Report  03/07/2015  CLINICAL DATA: Esophageal cancer. Chemotherapy and radiation therapy completed August 2015.  EXAM: CT ABDOMEN AND PELVIS WITH CONTRAST  TECHNIQUE: Multidetector CT imaging of the abdomen and pelvis was performed using the standard  protocol following bolus administration of intravenous contrast.  CONTRAST: 100 mL Isovue  COMPARISON: PET-CT scan 04/21/2014, CT scan 11/16/2014  FINDINGS: Lower chest: Lung bases are clear.  Hepatobiliary: No focal hepatic lesion. The gallbladder is collapsed.  Pancreas: Pancreas is normal. No ductal dilatation. No pancreatic inflammation.  Spleen: Normal spleen  Adrenals/urinary tract: Mild nodularity of adrenal glands is unchanged. Kidneys are normal. The ureters and  bladder normal.  Stomach/Bowel: There is thickening of the distal esophagus similar to prior. Stomach, small bowel, and colon are unremarkable.  Vascular/Lymphatic: Atherosclerotic calcification aorta. No periportal retroperitoneal lymphadenopathy. No gastrohepatic ligament lymphadenopathy. No pelvic adenopathy or inguinal adenopathy.  Reproductive: Post hysterectomy.  Musculoskeletal: No aggressive osseous lesion. Posterior lumbar fusion  Other: No peritoneal metastasis.  IMPRESSION: 1. No evidence of esophageal cancer recurrence or metastasis in the abdomen pelvis. 2. Stable thickening distal esophagus likely relates radiation change. 3. Atherosclerotic calcification of the aorta.   Electronically Signed By: Suzy Bouchard M.D. On: 03/07/2015 16:43    CLINICAL DATA: Shortness of breath; history of esophageal malignancy, PEG tube placement, appendectomy, hysterectomy, and lumbar fusion.  EXAM: CT ANGIOGRAPHY CHEST  CT ABDOMEN AND PELVIS WITH CONTRAST  TECHNIQUE: Multidetector CT imaging of the chest was performed using the standard protocol during bolus administration of intravenous contrast. Multiplanar CT image reconstructions and MIPs were obtained to evaluate the vascular anatomy. Multidetector CT imaging of the abdomen and pelvis was performed using the standard protocol during bolus administration of intravenous contrast.  CONTRAST: 100 cc of Isovue 370 intravenously. The  patient also received oral contrast material through the PEG tube.  COMPARISON: Portable chest x-ray of today's date and PET-CT study of April 21, 2014 and CT scan of the chest and abdomen abdomen dated March 31, 2014  FINDINGS: CTA CHEST FINDINGS  There are small filling defects within peripheral pulmonary arterial branches to the right lower lobe. There are no filling defects elsewhere on the right nor on the left. The caliber of the thoracic aorta is normal. The cardiac chambers are normal in size. The RV LV ratio is approximately 1. There is no pericardial effusion. There is no lymphadenopathy. There is thickening of the wall of the lower 1/3 of the esophagus without visible discrete mass. There is no free mediastinal fluid or air.  At lung window settings there is no pulmonary parenchymal mass. There is increased density in the posterior medial aspect of the right costophrenic gutter which may reflect pneumonia or pulmonary infarction new since the previous studies. There is no pleural effusion nor pulmonary parenchymal mass.  There are degenerative changes of the lower thoracic discs. There is no compression fracture. The sternum and observed ribs exhibit no acute abnormalities.  CT ABDOMEN and PELVIS FINDINGS  The liver, gallbladder, pancreas, spleen, right adrenal gland, and kidneys are normal. There is stable mild enlargement of the left adrenal gland. There is no bulky periaortic or pericaval lymphadenopathy. The abdominal aorta exhibits mural thrombus and calcified plaque, but no evidence of aneurysm. The stomach contains a PEG tube and is of partially distended with gas. The GE junction region remains prominent. No perigastric lymphadenopathy is demonstrated. The small bowel is normal. There is a moderate stool burden within the colon without evidence of obstruction. The rectum is mildly distended with gas and stool. The urinary bladder is unremarkable. The uterus is  surgically absent. The patient has undergone previous posterior fusion at L3-4 and L4-5. The vertebral bodies are preserved in height. The bony pelvis is unremarkable.  Review of the MIP images confirms the above findings.  IMPRESSION: 1. There are small emboli within branches of the right lower lobe pulmonary artery posteriorly without evidence of significant right heart strain. There is small amount of adjacent presumed infarct versus pneumonia. 2. There is mild thickening of the wall of the distal third of the esophagus without evidence of a discrete mass or perforation. 3. There is no evidence  of CHF nor pulmonary parenchymal masses. There is no pleural effusion. 4. No acute intra-abdominal abnormality is demonstrated. There are atherosclerotic changes of the abdominal aorta without evidence of dissection or aneurysm. There is stable mild enlargement of the left adrenal gland. 5. These results were called by telephone at the time of interpretation on 07/21/2014 at 12:59 pm to Dr. Isla Pence MD, who verbally acknowledged these results.   Electronically Signed By: David Martinique On: 07/21/2014 12:59   CLINICAL DATA: Initial treatment strategy for esophageal carcinoma.  EXAM: NUCLEAR MEDICINE PET SKULL BASE TO THIGH  TECHNIQUE: 11.0 mCi F-18 FDG was injected intravenously. Full-ring PET imaging was performed from the skull base to thigh after the radiotracer. CT data was obtained and used for attenuation correction and anatomic localization.  FASTING BLOOD GLUCOSE: Value: 73 mg/dl  COMPARISON: CT 03/31/2014  FINDINGS: NECK  No hypermetabolic lymph nodes in the neck.  CHEST  There is a long segment of intense hypermetabolic activity associated with the esophagus. This extends from relate just superior to the carina through the GE junction with SUV max equal 8.5. There is a hypermetabolic mass at the esophageal junction extending to the gastric cardia with SUV  max equals 17.3. No hypermetabolic mediastinal lymph nodes. No suspicious pulmonary nodules.  ABDOMEN/PELVIS  Hypermetabolic mass in the gastric cardia described above. There is no hypermetabolic gastro hepatic ligament lymph nodes. No abnormal metabolic activity within the liver.  No hypermetabolic abdominal pelvic lymph nodes  SKELETON  No focal hypermetabolic activity to suggest skeletal metastasis.  IMPRESSION: 1. Hypermetabolic mass in the gastric cardia and esophageal junction consists with primary esophageal / gastric carcinoma. 2. Long segment intense metabolic activity associated with the mid and distal esophagus. Differential includes esophageal carcinoma versus esophagitis. 3. No evidence of hypermetabolic mediastinal or gastrohepatic ligament lymph nodes. 4. No evidence of liver metastasis. 5. No distant metastasis   Electronically Signed By: Suzy Bouchard M.D. On: 04/21/2014 13:57    CLINICAL DATA: Difficulty swallowing and epigastric pain. Esophageal disorder. Prior appendectomy and hysterectomy.  EXAM: CT CHEST AND ABDOMEN WITH CONTRAST  TECHNIQUE: Multidetector CT imaging of the chest and abdomen was performed following the standard protocol during bolus administration of intravenous contrast.  CONTRAST: 80 cc Isovue 370  COMPARISON: Abdominal pelvic CT 02/10/2014. Chest radiograph 04/07/2013. Chest CT 04/23/2008.  FINDINGS: CT CHEST FINDINGS  Lungs/Pleura: No nodules or airspace opacities. No pleural fluid.  Heart/Mediastinum: No supraclavicular adenopathy. Mildly age advanced aortic and branch vessel atherosclerosis. Normal heart size with lipomatous hypertrophy of the interatrial septum. Multivessel coronary artery atherosclerosis. No mediastinal or hilar adenopathy. Air contrast level in the thoracic esophagus on image 28 of series 2 and more superiorly on image 19/series 2.  CT ABDOMEN FINDINGS  Abdomen: Normal liver, spleen.  Redemonstration of soft tissue fullness involving the gastroesophageal junction and proximal stomach. Example images 37-42 of series 2. Suspect fluid/gastric contents with soft tissue density in the gastric cardia/body junction on image 39/series 2.  Normal pancreas, gallbladder, biliary tract. Mild right adrenal thickening and left adrenal nodularity are grossly similar back to 2009. Suspect an underlying left adrenal adenoma at 1.3 cm. 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 Normal right kidney. Advanced aortic and branch vessel atherosclerosis. Ulcerative plaque within the infrarenal aorta, including on image 62/series 2. Beam hardening artifact from spinal hardware. No retroperitoneal or retrocrural adenopathy. Normal colon and terminal ileum. Normal abdominal small bowel without ascites. No evidence of omental or peritoneal disease.  Bones/Musculoskeletal: L3-5 lumbar spine fixation.  Lower thoracic degenerative disc disease.  IMPRESSION: CT CHEST IMPRESSION  1. No acute process in the chest. 2. Age advanced coronary artery atherosclerosis. Recommend assessment of coronary risk factors and consideration of medical therapy. 3. Mildly dilated thoracic esophagus with contrast within. This suggests a component of esophageal obstruction, dysmotility, or gastroesophageal reflux disease.  CT ABDOMEN AND PELVIS IMPRESSION  1. Redemonstration of soft tissue fullness at the distal esophagus and proximal stomach. Cannot exclude gastritis or gastroesophageal carcinoma. If not already performed, endoscopy is recommended. 2. Advanced abdominal aortic and branch vessel atherosclerosis.   Electronically Signed By: Abigail Miyamoto M.D. On: 03/31/2014 14:14   Pretreatment EUS: Endoscopic findings: 1. Malignant mass in distal esophagus. The mass was circumferential, patially obstructing but I was able to fairly easily advance echoendoscope through the strictured lumen.  The proximal edge was 34 cm from the incisors and distal edge (just below the GE junction) was at 39 cm. The mass is 5cm long. EUS findings: 1. The mass above corresponded with a hypoechoic, heterogeneous mass that clearly passes into and through the muscularis propria layer of the esophageal wall (uT3). 2. There were two round, well demarcated, 5-1mm, hypoechoic paraesophageal lymphnodes that lay directly adjacent to the mass that are suspicious for malignant involvement (uN1). 3. No celiac adenopathy. Impression: uT3N1 (clinical stage IIIa) 5cm long, cirumferential GE junction adenocarcinoma with proximal edge at 34cm from incisors and distal edge just below the GE junction.   Post Treatment EUS: Endoscopic findings: 1. The previously noted (2015) GE junction malignancy was smaller now, but still clearly present. It was 2-3cm long, non-circumferential, ulcerated, located at GE junction (37cm from incisors). EUS findings: 1. The mass above correlated with a hypoechoic, heterogeneous lesion that clearly passed into and through the muscularis propria layer of the distal esophagus, GE junction wall (uT3). 2. The was no paraesophageal, mediastinal, celiac adenopathy (uN0). ENDOSCOPIC IMPRESSION: uT3N0 (stage IIa) non-circumferential, 2-3cm long, ulcerated GE junction adenocarcinoma. This appears to have responded to neoadjuvant chemo (was previously staged IIIa) and so she may be a candidate for surgical resection   Recent Lab Findings: Lab Results  Component Value Date   WBC 12.3* 06/16/2015      Assessment / Plan:   Patient status post transhiatal total esophagectomy with cervical esophagogastrostomy, postoperatively complicated by anastomotic leak now healed She seems to be making progress with her current diet, I reviewed with her the anti-reflex treatment including elevating her bed at night to prevent bile reflux . Final pathologic stage IIb.  I plan to see her back in 4  weeks She'll continue on the current nutrition plan taking a soft diet as tolerated with supplemental feedings at night. She was given a prescription for Phenergan she notes that Zofran does not help the occasional nauseous she has. Her pain medication was renewed She continues on amiodarone 100 mg a day, this was decreased from 200 today when she was mildly bradycardic and was having significant nausea- this is improved on the current dose Patient reports she is to have a CT scan of the chest and abdomen in November.  Grace Isaac MD      [address] [place] [address] Office [phone]   Beeper [phone]  07/21/2015 11:04 AM

## 2015-08-05 ENCOUNTER — Other Ambulatory Visit: Payer: Self-pay | Admitting: Physician Assistant

## 2015-08-08 ENCOUNTER — Ambulatory Visit (INDEPENDENT_AMBULATORY_CARE_PROVIDER_SITE_OTHER): Payer: Commercial Managed Care - PPO | Admitting: Physician Assistant

## 2015-08-08 VITALS — BP 130/69 | HR 66 | Resp 16 | Ht 61.0 in | Wt 169.0 lb

## 2015-08-08 DIAGNOSIS — C159 Malignant neoplasm of esophagus, unspecified: Secondary | ICD-10-CM

## 2015-08-08 NOTE — Progress Notes (Signed)
HPI:  Sharon Price is S/P Esophagectomy on 7/12.  She has been doing well since hospital discharge.  She was last seen by Dr. Servando Snare on 07/21/2015.  She contacted our office with complaints of a fluid filled area along her abdominal incision.  She is accompanied by her grand daughter who also states her feeding tube is blocked.  The grand daughter pushed coke through the feeding tube last night.  The patient is otherwise doing well.  She has no new complaints.  She states she is eating fairly well.   Current Outpatient Prescriptions  Medication Sig Dispense Refill  . Albuterol Sulfate (PROAIR RESPICLICK IN) Inhale 1 puff into the lungs as needed (for coughing/ shortness of breath).     Marland Kitchen amiodarone (CORDARONE) 5 mg/mL SUSP Take 100 mg by mouth daily.    Marland Kitchen amiodarone (PACERONE) 100 MG tablet Take 1 tablet (100 mg total) by mouth daily. 30 tablet 1  . apixaban (ELIQUIS) 5 MG TABS tablet Take 1 tablet (5 mg total) by mouth 2 (two) times daily. 60 tablet 1  . chlorpheniramine-HYDROcodone (TUSSIONEX) 10-8 MG/5ML SUER Place 5 mLs into feeding tube every 12 (twelve) hours as needed for cough. 140 mL 0  . ergocalciferol (VITAMIN D2) 50000 UNITS capsule Take 50,000 Units by mouth every 30 (thirty) days. Takes around the 1st or 2nd of month.    . Hydrocortisone (GERHARDT'S BUTT CREAM) CREA Apply 1 application topically as needed for irritation.    . insulin glargine (LANTUS) 100 UNIT/ML injection Inject 0.1 mLs (10 Units total) into the skin at bedtime. (Patient taking differently: Inject 20 Units into the skin at bedtime. ) 10 mL 5  . lisinopril (PRINIVIL,ZESTRIL) 10 MG tablet Take 1 tablet (10 mg total) by mouth daily. 90 tablet 3  . metFORMIN (GLUCOPHAGE) 500 MG tablet Take 1 tablet (500 mg total) by mouth 2 (two) times daily with a meal.    . metoCLOPramide (REGLAN) 5 MG/5ML solution Take 10 mLs (10 mg total) by mouth every 6 (six) hours. 473 mL 3  . metoprolol tartrate (LOPRESSOR) 25 mg/10 mL SUSP Take  5 mLs (12.5 mg total) by mouth 2 (two) times daily. 300 mL 3  . oxyCODONE (ROXICODONE) 5 MG/5ML solution Take 5 mLs (5 mg total) by mouth every 4 (four) hours as needed for severe pain. 473 mL 0  . PAIN & FEVER CHILDRENS 160 MG/5ML solution TAKE 10.2ML BY MOUTH EVERY 4 HOURS AS NEEDED FOR MODERATE PAIN OR HEADACHE. 473 mL 0  . pantoprazole sodium (PROTONIX) 40 mg/20 mL PACK Take 20 mLs (40 mg total) by mouth 2 (two) times daily. 60 each 3  . promethazine (PHENERGAN) 12.5 MG tablet Take 1 tablet (12.5 mg total) by mouth every 8 (eight) hours as needed for nausea, vomiting or refractory nausea / vomiting. 30 tablet 0  . sennosides (SENOKOT) 8.8 MG/5ML syrup Take 10 mLs by mouth 2 (two) times daily. 240 mL 5  . simvastatin (ZOCOR) 40 MG tablet Take 40 mg by mouth daily.     No current facility-administered medications for this visit.    Physical Exam:  BP 130/69 mmHg  Pulse 66  Resp 16  Ht 5\' 1"  (1.549 m)  Wt 169 lb (76.658 kg)  BMI 31.95 kg/m2  SpO2 97%  Gen: no apparent distress Heart: RRR Abd: soft, tender around feeding tube Skin: abdominal incision well healed, there is a small 1 cm area that appears to be a blister.  There is no purulent drainage present, or  surrounding erythema  A/P:  1. Blister of Abdominal incision- patient instructed not to open blister.  She was instructed to keep wound clean and dry 2. Blocked feeding tube- J tube flushed with 120 cc of coke without difficulty.... Tube did not appear to be clogged... Will continue tube feeds as scheduled 3. RTC on 08/18/2015 as scheduled with Dr. Damita Dunnings, PA-C [facility] [phone]

## 2015-08-15 ENCOUNTER — Encounter (HOSPITAL_COMMUNITY): Payer: Self-pay | Admitting: Emergency Medicine

## 2015-08-15 DIAGNOSIS — Z794 Long term (current) use of insulin: Secondary | ICD-10-CM | POA: Insufficient documentation

## 2015-08-15 DIAGNOSIS — Z431 Encounter for attention to gastrostomy: Secondary | ICD-10-CM | POA: Insufficient documentation

## 2015-08-15 DIAGNOSIS — Z859 Personal history of malignant neoplasm, unspecified: Secondary | ICD-10-CM | POA: Insufficient documentation

## 2015-08-15 DIAGNOSIS — I129 Hypertensive chronic kidney disease with stage 1 through stage 4 chronic kidney disease, or unspecified chronic kidney disease: Secondary | ICD-10-CM | POA: Diagnosis not present

## 2015-08-15 DIAGNOSIS — E119 Type 2 diabetes mellitus without complications: Secondary | ICD-10-CM | POA: Insufficient documentation

## 2015-08-15 DIAGNOSIS — Z79899 Other long term (current) drug therapy: Secondary | ICD-10-CM | POA: Insufficient documentation

## 2015-08-15 DIAGNOSIS — Z87891 Personal history of nicotine dependence: Secondary | ICD-10-CM | POA: Insufficient documentation

## 2015-08-15 DIAGNOSIS — K219 Gastro-esophageal reflux disease without esophagitis: Secondary | ICD-10-CM | POA: Insufficient documentation

## 2015-08-15 DIAGNOSIS — Z8701 Personal history of pneumonia (recurrent): Secondary | ICD-10-CM | POA: Insufficient documentation

## 2015-08-15 DIAGNOSIS — N182 Chronic kidney disease, stage 2 (mild): Secondary | ICD-10-CM | POA: Insufficient documentation

## 2015-08-15 DIAGNOSIS — E785 Hyperlipidemia, unspecified: Secondary | ICD-10-CM | POA: Insufficient documentation

## 2015-08-15 DIAGNOSIS — Z86711 Personal history of pulmonary embolism: Secondary | ICD-10-CM | POA: Insufficient documentation

## 2015-08-15 NOTE — ED Notes (Signed)
Pt. noticed that her G-tube is longer/sutures loose  from insertion site this evening with mild pain , no bleeding .

## 2015-08-16 ENCOUNTER — Emergency Department (HOSPITAL_COMMUNITY): Payer: Commercial Managed Care - PPO

## 2015-08-16 ENCOUNTER — Emergency Department (HOSPITAL_COMMUNITY)
Admission: EM | Admit: 2015-08-16 | Discharge: 2015-08-16 | Disposition: A | Discharge status: Home / Self Care | Payer: Commercial Managed Care - PPO | Attending: Emergency Medicine | Admitting: Emergency Medicine

## 2015-08-16 DIAGNOSIS — Z0189 Encounter for other specified special examinations: Secondary | ICD-10-CM

## 2015-08-16 MED ORDER — IOHEXOL 300 MG/ML  SOLN
50.0000 mL | Freq: Once | INTRAMUSCULAR | Status: DC | PRN
  Administered 2015-08-16: 25 mL via ORAL
  Filled 2015-08-16: qty 50

## 2015-08-16 NOTE — ED Notes (Signed)
Pt's G tube secured with a foley catheter holder. Dressing changed.

## 2015-08-16 NOTE — ED Provider Notes (Signed)
CSN: [id number]     Arrival date & time 08/15/15  2157 History  By signing my name below, I, Rayna Sexton, attest that this documentation has been prepared under the direction and in the presence of Jola Schmidt, MD. Electronically Signed: Rayna Sexton, ED Scribe. 08/16/2015. 1:38 AM.   No chief complaint on file.  The history is provided by the patient. No language interpreter was used.    HPI Comments: Sharon Price is a 69 y.o. female who presents to the Emergency Department due jejunostomy tube complications with onset earlier today. Pt notes a suture popped and her j tube became partially removed about 5-6 cm. Pt notes a hx of esophogeal CA and underwent jejunostomy tube placement for additional nutrient needs.  No other significant complaints.  She is concerned about the possibility her jejunostomy tube can be dislodged  Past Medical History  Diagnosis Date  . Chronic kidney disease     renal..STAGE 2  . Hypertension   . Mechanical dysphagia     MOSTLY SOLIDS  . Pulmonary emboli ([place]) 9/223/15 [facility]    RIGHT LOWER LOBE PER CT CHEST   . Patient on combined chemotherapy and radiation     [facility]  . Cancer ([place]) 03/30/14    GE JUNCTION  . Transfusion history     ?[facility].  . Family history of adverse reaction to anesthesia     Patients sister is very nauseated after anesthesia  . Pneumonia   . Diabetes mellitus without complication (HCC)     Type 2  . Hyperlipemia   . GERD (gastroesophageal reflux disease)    Past Surgical History  Procedure Laterality Date  . Back surgery    . Abdominal hysterectomy    . Tubal ligation    . Eus N/A 04/08/2014    Procedure: ESOPHAGEAL ENDOSCOPIC ULTRASOUND (EUS) RADIAL;  Surgeon: Milus Banister, MD;  Location: [facility];  Service: Endoscopy;  Laterality: N/A;  . Appendectomy    . Spine surgery    . Lumbar fusion    . Esophagogastroduodenoscopy (egd) with  propofol N/A 12/02/2014    Procedure: ESOPHAGOGASTRODUODENOSCOPY (EGD) WITH PROPOFOL;  Surgeon: Jerene Bears, MD;  Location: [facility];  Service: Endoscopy;  Laterality: N/A;  . Eus N/A 03/31/2015    Procedure: UPPER ENDOSCOPIC ULTRASOUND (EUS) RADIAL;  Surgeon: Milus Banister, MD;  Location: [facility];  Service: Endoscopy;  Laterality: N/A;  . Cataract extraction Bilateral   . Video bronchoscopy N/A 05/10/2015    Procedure: VIDEO BRONCHOSCOPY;  Surgeon: Grace Isaac, MD;  Location: [place];  Service: Thoracic;  Laterality: N/A;  . Complete esophagectomy N/A 05/10/2015    Procedure: TRANSHIATAL TOTAL ESOPHAGEAL RESECTION;  Surgeon: Grace Isaac, MD;  Location: [place];  Service: Thoracic;  Laterality: N/A;  . Jejunostomy N/A 05/10/2015    Procedure: FEEDING JEJUNOSTOMY;  Surgeon: Grace Isaac, MD;  Location: [place];  Service: Thoracic;  Laterality: N/A;  . Mass excision Left 05/20/2015    Procedure: EXPLORATION OF LEFT NECK;  Surgeon: Grace Isaac, MD;  Location: [facility];  Service: Thoracic;  Laterality: Left;   Family History  Problem Relation Age of Onset  . Diabetes Mother   . Hypertension Mother   . Stroke Mother   . Heart disease Mother   . Hypertension Father   . Diabetes Sister   . Cancer Sister     THYROID  . Cancer Brother     LUNG  .  Diabetes Daughter   . Diabetes Son    Social History  Substance Use Topics  . Smoking status: Former Smoker -- 1.00 packs/day for 0 years    Quit date: 03/02/2015  . Smokeless tobacco: None     Comment: 3 weeks- < 1ppd.  . Alcohol Use: No   OB History    No data available     Review of Systems A complete 10 system review of systems was obtained and all systems are negative except as noted in the HPI and PMH.   Allergies  Review of patient's allergies indicates no known allergies.  Home Medications   Prior to Admission medications   Medication Sig Start Date End Date Taking? Authorizing Provider  Albuterol Sulfate  (PROAIR RESPICLICK IN) Inhale 1 puff into the lungs as needed (for coughing/ shortness of breath).     Historical Provider, MD  amiodarone (CORDARONE) 5 mg/mL SUSP Take 100 mg by mouth daily.    Historical Provider, MD  amiodarone (PACERONE) 100 MG tablet Take 1 tablet (100 mg total) by mouth daily. 07/21/15   Grace Isaac, MD  apixaban (ELIQUIS) 5 MG TABS tablet Take 1 tablet (5 mg total) by mouth 2 (two) times daily. 06/17/15   Erin R Barrett, PA-C  chlorpheniramine-HYDROcodone (TUSSIONEX) 10-8 MG/5ML SUER Place 5 mLs into feeding tube every 12 (twelve) hours as needed for cough. 06/17/15   Erin R Barrett, PA-C  ergocalciferol (VITAMIN D2) 50000 UNITS capsule Take 50,000 Units by mouth every 30 (thirty) days. Takes around the 1st or 2nd of month.    Historical Provider, MD  Hydrocortisone (GERHARDT'S BUTT CREAM) CREA Apply 1 application topically as needed for irritation. 06/17/15   Erin R Barrett, PA-C  insulin glargine (LANTUS) 100 UNIT/ML injection Inject 0.1 mLs (10 Units total) into the skin at bedtime. Patient taking differently: Inject 20 Units into the skin at bedtime.  12/07/14   Theodis Blaze, MD  lisinopril (PRINIVIL,ZESTRIL) 10 MG tablet Take 1 tablet (10 mg total) by mouth daily. 03/31/15   Isaiah Serge, NP  metFORMIN (GLUCOPHAGE) 500 MG tablet Take 1 tablet (500 mg total) by mouth 2 (two) times daily with a meal. 03/30/15   Josue Hector, MD  metoCLOPramide (REGLAN) 5 MG/5ML solution Take 10 mLs (10 mg total) by mouth every 6 (six) hours. 06/17/15   Erin R Barrett, PA-C  metoprolol tartrate (LOPRESSOR) 25 mg/10 mL SUSP Take 5 mLs (12.5 mg total) by mouth 2 (two) times daily. 06/17/15   Erin R Barrett, PA-C  oxyCODONE (ROXICODONE) 5 MG/5ML solution Take 5 mLs (5 mg total) by mouth every 4 (four) hours as needed for severe pain. 07/21/15   Grace Isaac, MD  PAIN & FEVER CHILDRENS 160 MG/5ML solution TAKE 10.2ML BY MOUTH EVERY 4 HOURS AS NEEDED FOR MODERATE PAIN OR HEADACHE. 08/05/15   Erin  R Barrett, PA-C  pantoprazole sodium (PROTONIX) 40 mg/20 mL PACK Take 20 mLs (40 mg total) by mouth 2 (two) times daily. 06/17/15   Erin R Barrett, PA-C  promethazine (PHENERGAN) 12.5 MG tablet Take 1 tablet (12.5 mg total) by mouth every 8 (eight) hours as needed for nausea, vomiting or refractory nausea / vomiting. 07/21/15   Grace Isaac, MD  sennosides (SENOKOT) 8.8 MG/5ML syrup Take 10 mLs by mouth 2 (two) times daily. 12/07/14   Theodis Blaze, MD  simvastatin (ZOCOR) 40 MG tablet Take 40 mg by mouth daily.    Historical Provider, MD   BP 135/77  mmHg  Pulse 89  Temp(Src) 98.1 F (36.7 C) (Oral)  Resp 14  SpO2 97% Physical Exam  Constitutional: She is oriented to person, place, and time. She appears well-developed and well-nourished.  HENT:  Head: Normocephalic.  Eyes: EOM are normal.  Neck: Normal range of motion.  Pulmonary/Chest: Effort normal.  Abdominal: She exhibits no distension.  Jejunostomy tube in place without surrounding signs of infection  Musculoskeletal: Normal range of motion.  Neurological: She is alert and oriented to person, place, and time.  Psychiatric: She has a normal mood and affect.  Nursing note and vitals reviewed.  ED Course  Procedures  DIAGNOSTIC STUDIES: Oxygen Saturation is 97% on RA, normal by my interpretation.    COORDINATION OF CARE: 1:37 AM Discussed treatment plan with pt at bedside and pt agreed to plan.  Labs Review Labs Reviewed - No data to display  Imaging Review Dg Abd 1 View  08/16/2015  CLINICAL DATA:  Assess J-tube repositioning.  Initial encounter. EXAM: ABDOMEN - 1 VIEW COMPARISON:  Abdominal radiograph performed 05/18/2015 FINDINGS: The patient's J-tube is noted ending overlying the left mid abdomen, with injected contrast filling the jejunum. The visualized bowel gas pattern is grossly unremarkable. No free intra-abdominal air is seen, though evaluation for free air is limited on a single supine view. No acute osseous  abnormalities are identified. The patient is status post lumbar spinal fusion at L3-L5. IMPRESSION: Injection of contrast into the J-tube fills the jejunum, as expected. Electronically Signed   By: Garald Balding M.D.   On: 08/16/2015 02:24  I personally reviewed the imaging tests through PACS system I reviewed available ER/hospitalization records through the EMR    EKG Interpretation None      MDM   Final diagnoses:  None    Jejunostomy appears to be in adequate position.  No signs Of cases.  Primary care and CT surgery follow-up  I, Caiya Bettes M, personally performed the services described in this documentation. All medical record entries made by the scribe were at my direction and in my presence.  I have reviewed the chart and discharge instructions and agree that the record reflects my personal performance and is accurate and complete. Loys Hoselton M.  08/16/2015. 5:30 AM.        Jola Schmidt, MD 08/16/15 0530

## 2015-08-17 ENCOUNTER — Other Ambulatory Visit: Payer: Self-pay | Admitting: Physician Assistant

## 2015-08-17 ENCOUNTER — Other Ambulatory Visit: Payer: Self-pay | Admitting: Cardiovascular Disease

## 2015-08-18 ENCOUNTER — Encounter: Payer: Self-pay | Admitting: Cardiothoracic Surgery

## 2015-08-18 ENCOUNTER — Ambulatory Visit (INDEPENDENT_AMBULATORY_CARE_PROVIDER_SITE_OTHER): Payer: Self-pay | Admitting: Cardiothoracic Surgery

## 2015-08-18 VITALS — BP 120/69 | HR 72 | Resp 20 | Ht 61.0 in | Wt 164.0 lb

## 2015-08-18 DIAGNOSIS — C155 Malignant neoplasm of lower third of esophagus: Secondary | ICD-10-CM

## 2015-08-18 NOTE — Progress Notes (Signed)
[address]       [place] [address]             [place] Record #[id number] Date of Birth: [birth year]  Referring: Marice Potter, MD Primary Care: Lillard Anes, MD  Chief Complaint:    Chief Complaint  Patient presents with  . Esophageal Cancer    4 week f/u   05/10/2015 DATE OF DISCHARGE:  OPERATIVE REPORT PREOPERATIVE DIAGNOSIS: Adenocarcinoma of the distal esophagus. POSTOPERATIVE DIAGNOSIS: Adenocarcinoma of the distal esophagus. PROCEDURE: Video bronchoscopy, transhiatal total esophagectomy with cervical esophagogastrostomy, pyloroplasty, feeding jejunostomy. SURGEON: Lanelle Bal, MD.  Esophageal cancer   Staging form: Esophagus - Adenocarcinoma, AJCC 7th Edition     Pathologic stage from 05/13/2015: Stage IIB (T3, N0, cM0, G3 - Poorly differentiated) - Signed by Grace Isaac, MD on 05/16/2015       History of Present Illness:    Sharon Price 69 y.o. female is seen in the office in follow-up after resection of her adenocarcinoma the distal esophagus. She underwent surgery on June 12, did develop a cervical anastomotic leak which gradually healed and has now closed. She is now at home on jejunal tube feedings at night and soft diet during the day. Since discharge she's had no fever chills and appears to be progressing well. Recently she has been gaining weight.   Originally she was diagnosed with clinical stage IIIa T3 N1 MO esophageal cancer for an ulcerated adenocarcinoma [place] pathology [phone]. The patient has completed neoadjuvant chemotherapy radiation with weekly carboplatin and and paclltaxel in AUG 2015.   Patient is no longer smoking.  Patient continues on request for history of pulmonary embolus in the fall of 2015  Patient describes some bile reflux early in the morning when she first gets up.  Current Activity/ Functional Status:  Patient  is independent with mobility/ambulation, transfers, ADL's, IADL's.   Zubrod Score: At the time of surgery this patient's most appropriate activity status/level should be described as: []     0    Normal activity, no symptoms [x]     1    Restricted in physical strenuous activity but ambulatory, able to do out light work []     2    Ambulatory and capable of self care, unable to do work activities, up and about               >50 % of waking hours                              []     3    Only limited self care, in bed greater than 50% of waking hours []     4    Completely disabled, no self care, confined to bed or chair []     5    Moribund   Past Medical History  Diagnosis Date  . Chronic kidney disease     renal..STAGE 2  . Hypertension   . Mechanical dysphagia     MOSTLY SOLIDS  . Pulmonary emboli ([place]) 9/223/15 [facility]    RIGHT LOWER LOBE PER CT CHEST   . Patient on combined chemotherapy and radiation     [facility]  . Cancer ([place]) 03/30/14    GE JUNCTION  .  Transfusion history     ?[facility].  . Family history of adverse reaction to anesthesia     Patients sister is very nauseated after anesthesia  . Pneumonia   . Diabetes mellitus without complication (HCC)     Type 2  . Hyperlipemia   . GERD (gastroesophageal reflux disease)     Past Surgical History  Procedure Laterality Date  . Back surgery    . Abdominal hysterectomy    . Tubal ligation    . Eus N/A 04/08/2014    Procedure: ESOPHAGEAL ENDOSCOPIC ULTRASOUND (EUS) RADIAL;  Surgeon: Milus Banister, MD;  Location: [facility];  Service: Endoscopy;  Laterality: N/A;  . Appendectomy    . Spine surgery    . Lumbar fusion    . Esophagogastroduodenoscopy (egd) with propofol N/A 12/02/2014    Procedure: ESOPHAGOGASTRODUODENOSCOPY (EGD) WITH PROPOFOL;  Surgeon: Jerene Bears, MD;  Location: [facility];  Service: Endoscopy;  Laterality: N/A;  . Eus N/A 03/31/2015     Procedure: UPPER ENDOSCOPIC ULTRASOUND (EUS) RADIAL;  Surgeon: Milus Banister, MD;  Location: [facility];  Service: Endoscopy;  Laterality: N/A;  . Cataract extraction Bilateral   . Video bronchoscopy N/A 05/10/2015    Procedure: VIDEO BRONCHOSCOPY;  Surgeon: Grace Isaac, MD;  Location: [place];  Service: Thoracic;  Laterality: N/A;  . Complete esophagectomy N/A 05/10/2015    Procedure: TRANSHIATAL TOTAL ESOPHAGEAL RESECTION;  Surgeon: Grace Isaac, MD;  Location: [place];  Service: Thoracic;  Laterality: N/A;  . Jejunostomy N/A 05/10/2015    Procedure: FEEDING JEJUNOSTOMY;  Surgeon: Grace Isaac, MD;  Location: [place];  Service: Thoracic;  Laterality: N/A;  . Mass excision Left 05/20/2015    Procedure: EXPLORATION OF LEFT NECK;  Surgeon: Grace Isaac, MD;  Location: [place];  Service: Thoracic;  Laterality: Left;    Family History  Problem Relation Age of Onset  . Diabetes Mother   . Hypertension Mother   . Stroke Mother   . Heart disease Mother   . Hypertension Father   . Diabetes Sister   . Cancer Sister     THYROID  . Cancer Brother     LUNG  . Diabetes Daughter   . Diabetes Son     Social History   Social History  . Marital Status: Widowed    Spouse Name: N/A  . Number of Children: N/A  . Years of Education: N/A   Occupational History  . Not on file.   Social History Main Topics  . Smoking status: Former Smoker -- 1.00 packs/day for 0 years    Quit date: 03/02/2015  . Smokeless tobacco: Not on file     Comment: 3 weeks- < 1ppd.  . Alcohol Use: No  . Drug Use: No  . Sexual Activity: Not on file   Other Topics Concern  . Not on file   Social History Narrative    History  Smoking status  . Former Smoker -- 1.00 packs/day for 0 years  . Quit date: 03/02/2015  Smokeless tobacco  . Not on file    Comment: 3 weeks- < 1ppd.    History  Alcohol Use No     No Known Allergies  Current Outpatient Prescriptions  Medication Sig Dispense Refill   . Albuterol Sulfate (PROAIR RESPICLICK IN) Inhale 1 puff into the lungs as needed (for coughing/ shortness of breath).     Marland Kitchen amiodarone (CORDARONE) 5 mg/mL SUSP Take 100 mg by mouth daily.    Marland Kitchen  amiodarone (PACERONE) 100 MG tablet Take 1 tablet (100 mg total) by mouth daily. 30 tablet 1  . chlorpheniramine-HYDROcodone (TUSSIONEX) 10-8 MG/5ML SUER Place 5 mLs into feeding tube every 12 (twelve) hours as needed for cough. 140 mL 0  . ELIQUIS 5 MG TABS tablet TAKE 1 TABLET BY MOUTH TWICE DAILY. 60 tablet 11  . ergocalciferol (VITAMIN D2) 50000 UNITS capsule Take 50,000 Units by mouth every 30 (thirty) days. Takes around the 1st or 2nd of month.    . Hydrocortisone (Amaka Gluth'S BUTT CREAM) CREA Apply 1 application topically as needed for irritation.    . insulin glargine (LANTUS) 100 UNIT/ML injection Inject 0.1 mLs (10 Units total) into the skin at bedtime. (Patient taking differently: Inject 20 Units into the skin at bedtime. ) 10 mL 5  . lisinopril (PRINIVIL,ZESTRIL) 10 MG tablet Take 1 tablet (10 mg total) by mouth daily. 90 tablet 3  . metFORMIN (GLUCOPHAGE) 500 MG tablet Take 1 tablet (500 mg total) by mouth 2 (two) times daily with a meal.    . metoCLOPramide (REGLAN) 5 MG/5ML solution Take 10 mLs (10 mg total) by mouth every 6 (six) hours. 473 mL 3  . metoprolol tartrate (LOPRESSOR) 25 mg/10 mL SUSP Take 5 mLs (12.5 mg total) by mouth 2 (two) times daily. 300 mL 3  . oxyCODONE (ROXICODONE) 5 MG/5ML solution Take 5 mLs (5 mg total) by mouth every 4 (four) hours as needed for severe pain. 473 mL 0  . PAIN & FEVER CHILDRENS 160 MG/5ML solution TAKE 10.2ML BY MOUTH EVERY 4 HOURS AS NEEDED FOR MODERATE PAIN OR HEADACHE. 473 mL 0  . pantoprazole sodium (PROTONIX) 40 mg/20 mL PACK Take 20 mLs (40 mg total) by mouth 2 (two) times daily. 60 each 3  . promethazine (PHENERGAN) 12.5 MG tablet Take 1 tablet (12.5 mg total) by mouth every 8 (eight) hours as needed for nausea, vomiting or refractory nausea /  vomiting. 30 tablet 0  . sennosides (SENOKOT) 8.8 MG/5ML syrup Take 10 mLs by mouth 2 (two) times daily. 240 mL 5  . simvastatin (ZOCOR) 40 MG tablet Take 40 mg by mouth daily.     No current facility-administered medications for this visit.     Review of Systems:     Cardiac Review of Systems: Y or N  Chest Pain [  y  ]  Resting SOB [ n  ] Exertional SOB  [ y ]  Orthopnea [  ]   Pedal Edema [   ]    Palpitations [  ] Syncope  [  ]   Presyncope [   ]  General Review of Systems: [Y] = yes [  ]=no Constitional: recent weight change [ gained wt ];  Wt loss over the last 3 months [ ]  anorexia [  ]; fatigue [ y ]; nausea [ y ]; night sweats [  ]; fever [  ]; or chills [  ];          Dental: poor dentition[ n ]; Last Dentist visit:   Eye : blurred vision [  ]; diplopia [   ]; vision changes [  ];  Amaurosis fugax[  ]; Resp: cough [  ];  wheezing[  ];  hemoptysis[ n ]; shortness of breath[ n ]; paroxysmal nocturnal dyspnea[  ]; dyspnea on exertion[ y ]; or orthopnea[  ];  GI:  gallstones[  ], vomiting[n  ];  dysphagia[y  ]; melena[  ];  hematochezia [ y ]; heartburn[ y ];  Hx of  Colonoscopy[  ]; GU: kidney stones [  ]; hematuria[  ];   dysuria [  ];  nocturia[  ];  history of     obstruction [  ]; urinary frequency [  ]             Skin: rash, swelling[  ];, hair loss[  ];  peripheral edema[  ];  or itching[  ]; Musculosketetal: myalgias[  ];  joint swelling[  ];  joint erythema[  ];  joint pain[  ];  back pain[  ];  Heme/Lymph: bruising[  ];  bleeding[n  ];  anemia[  ];  Neuro: TIA[  ];  headaches[  ];  stroke[  ];  vertigo[  ];  seizures[  ];   paresthesias[  ];  difficulty walking[ n ];  Psych:depression[  ]; anxiety[ y ];  Endocrine: diabetes[  ];  thyroid dysfunction[  ];  Immunizations: Flu up to date [ n ]; Pneumococcal up to date [facility]  ];  Other:  Physical Exam: BP 120/69 mmHg  Pulse 72  Resp 20  Ht 5\' 1"  (1.549 m)  Wt 164 lb (74.39 kg)  BMI 31.00 kg/m2  SpO2 96%   Wt Readings  from Last 3 Encounters:  08/18/15 164 lb (74.39 kg)  08/08/15 169 lb (76.658 kg)  07/21/15 163 lb (73.936 kg)   weight up from 132  PHYSICAL EXAMINATION: BP 120/69 mmHg  Pulse 72  Resp 20  Ht 5\' 1"  (1.549 m)  Wt 164 lb (74.39 kg)  BMI 31.00 kg/m2  SpO2 96%   General appearance: alert, cooperative, appears older than stated age, cachectic and fatigued Neurologic: intact Heart: Appears to be in a regular rhythm today Lungs: diminished breath sounds bibasilar Abdomen: Abdomen mildly distended with intact G-tube in the left upper abdomen Extremities: extremities normal, atraumatic, no cyanosis or edema and Homans sign is negative, no sign of DVT Wound:Abdominal incision is well-healed , the left neck incision is also well-healed without evidence of drainage  I do not appreciate cervical or supraclavicular adenopathy  Her abdominal incision is well-healed, the left neck incisions healed without drainage on exam today, the jejunostomy tube is in place but suture were pulled out.  In office lidocaine infiltrated around j tube and resutured in place.   Diagnostic Studies & Laboratory data:     Recent Radiology Findings: Dg Abd 1 View  08/16/2015  CLINICAL DATA:  Assess J-tube repositioning.  Initial encounter. EXAM: ABDOMEN - 1 VIEW COMPARISON:  Abdominal radiograph performed 05/18/2015 FINDINGS: The patient's J-tube is noted ending overlying the left mid abdomen, with injected contrast filling the jejunum. The visualized bowel gas pattern is grossly unremarkable. No free intra-abdominal air is seen, though evaluation for free air is limited on a single supine view. No acute osseous abnormalities are identified. The patient is status post lumbar spinal fusion at L3-L5. IMPRESSION: Injection of contrast into the J-tube fills the jejunum, as expected. Electronically Signed   By: Garald Balding M.D.   On: 08/16/2015 02:24   Dg Chest 2 View  06/23/2015   CLINICAL DATA:  History of esophageal  cancer, status post surgery 07/06/2015, status post radiation and chemotherapy  EXAM: CHEST  2 VIEW  COMPARISON:  06/15/2017  FINDINGS: Increased interstitial markings. No focal consolidation. Patchy left lower lobe opacity, likely atelectasis. Suspected small left pleural effusion.  Heart is normal in size.  Degenerative changes of the visualized thoracolumbar spine. Lumbar spine fixation hardware.  IMPRESSION: Patchy left lower  lobe opacity, likely atelectasis. Suspected small left pleural effusion.   Electronically Signed   By: Julian Hy M.D.   On: 06/23/2015 10:52    07:34   Dg Esophagus W/water Sol Cm  06/16/2015   CLINICAL DATA:  69 year old female with history of esophageal cancer status post esophagectomy gastric pull-through, complicated by anastomotic leak. Followup study.  EXAM: ESOPHOGRAM/BARIUM SWALLOW  TECHNIQUE: Single contrast examination was performed using  water-soluble.  FLUOROSCOPY TIME:  If the device does not provide the exposure index:  Fluoroscopy Time:  1 minutes and 6 seconds  Number of Acquired Images:  6 series  COMPARISON:  05/30/2015.  FINDINGS: Multiple swallows were observed, which again demonstrated postoperative anatomy of esophagectomy with gastric pull-through. The anastomosis immediately above the level of the aortic arch was more normal in appearance on today's examination. Specifically, no extravasation of contrast was noted at any point during today's study comment even during multiple repeated swallows.  IMPRESSION: 1. Expected postoperative appearance of esophagectomy and gastric pull through, with no evidence of residual anastomotic leak.   Electronically Signed   By: Vinnie Langton M.D.   On: 06/16/2015 08:53   Nm Pet Image Restag (ps) Skull Base To Thigh  03/24/2015   CLINICAL DATA:  Subsequent treatment strategy for esophageal cancer.  EXAM: NUCLEAR MEDICINE PET SKULL BASE TO THIGH  TECHNIQUE: 7.4 mCi F-18 FDG was injected intravenously. Full-ring PET  imaging was performed from the skull base to thigh after the radiotracer. CT data was obtained and used for attenuation correction and anatomic localization.  FASTING BLOOD GLUCOSE:  Value: 219 mg/dl  COMPARISON:  CTs of the neck, abdomen and pelvis 03/07/2015. Chest CT 11/29/2014.  FINDINGS: NECK  No hypermetabolic cervical lymph nodes are identified.There are no lesions of the pharyngeal mucosal space. There is low-level activity associated with the muscles of phonation, within physiologic limits.  CHEST  There are no hypermetabolic mediastinal, hilar or axillary lymph nodes. There is focal hypermetabolic activity at the gastroesophageal junction, extending over a short segment. This has an SUV max of 5.5. Underlying wall thickening in this region appears grossly stable without well-defined mass. There is no suspicious pulmonary activity or suspicious pulmonary nodule. Mild emphysema, basilar interstitial prominence and small pleural effusions are noted.  ABDOMEN/PELVIS  There is no hypermetabolic activity within the liver, adrenal glands, spleen or pancreas. There is no hypermetabolic nodal activity. Pancreatic atrophy and aortoiliac atherosclerosis noted.  SKELETON  There is no hypermetabolic activity to suggest osseous metastatic disease. There are postsurgical changes within the lumbar spine.  IMPRESSION: 1. Nonspecific short segment hypermetabolic activity at the gastroesophageal junction may be related to treated tumor and/or radiation change. 2. No evidence of metastatic disease. There is no abnormal activity within the liver or adjacent lymph nodes.   Electronically Signed   By: Richardean Sale M.D.   On: 03/24/2015 10:20  I have independently reviewed the above radiology studies  and reviewed the findings with the patient.  Final Report  03/07/2015  CLINICAL DATA: Esophageal cancer. Chemotherapy and radiation therapy completed August 2015.  EXAM: CT ABDOMEN AND PELVIS WITH  CONTRAST  TECHNIQUE: Multidetector CT imaging of the abdomen and pelvis was performed using the standard protocol following bolus administration of intravenous contrast.  CONTRAST: 100 mL Isovue  COMPARISON: PET-CT scan 04/21/2014, CT scan 11/16/2014  FINDINGS: Lower chest: Lung bases are clear.  Hepatobiliary: No focal hepatic lesion. The gallbladder is collapsed.  Pancreas: Pancreas is normal. No ductal dilatation. No pancreatic inflammation.  Spleen:  Normal spleen  Adrenals/urinary tract: Mild nodularity of adrenal glands is unchanged. Kidneys are normal. The ureters and bladder normal.  Stomach/Bowel: There is thickening of the distal esophagus similar to prior. Stomach, small bowel, and colon are unremarkable.  Vascular/Lymphatic: Atherosclerotic calcification aorta. No periportal retroperitoneal lymphadenopathy. No gastrohepatic ligament lymphadenopathy. No pelvic adenopathy or inguinal adenopathy.  Reproductive: Post hysterectomy.  Musculoskeletal: No aggressive osseous lesion. Posterior lumbar fusion  Other: No peritoneal metastasis.  IMPRESSION: 1. No evidence of esophageal cancer recurrence or metastasis in the abdomen pelvis. 2. Stable thickening distal esophagus likely relates radiation change. 3. Atherosclerotic calcification of the aorta.   Electronically Signed By: Suzy Bouchard M.D. On: 03/07/2015 16:43    CLINICAL DATA: Shortness of breath; history of esophageal malignancy, PEG tube placement, appendectomy, hysterectomy, and lumbar fusion.  EXAM: CT ANGIOGRAPHY CHEST  CT ABDOMEN AND PELVIS WITH CONTRAST  TECHNIQUE: Multidetector CT imaging of the chest was performed using the standard protocol during bolus administration of intravenous contrast. Multiplanar CT image reconstructions and MIPs were obtained to evaluate the vascular anatomy. Multidetector CT imaging of the abdomen and pelvis was performed using the standard  protocol during bolus administration of intravenous contrast.  CONTRAST: 100 cc of Isovue 370 intravenously. The patient also received oral contrast material through the PEG tube.  COMPARISON: Portable chest x-ray of today's date and PET-CT study of April 21, 2014 and CT scan of the chest and abdomen abdomen dated March 31, 2014  FINDINGS: CTA CHEST FINDINGS  There are small filling defects within peripheral pulmonary arterial branches to the right lower lobe. There are no filling defects elsewhere on the right nor on the left. The caliber of the thoracic aorta is normal. The cardiac chambers are normal in size. The RV LV ratio is approximately 1. There is no pericardial effusion. There is no lymphadenopathy. There is thickening of the wall of the lower 1/3 of the esophagus without visible discrete mass. There is no free mediastinal fluid or air.  At lung window settings there is no pulmonary parenchymal mass. There is increased density in the posterior medial aspect of the right costophrenic gutter which may reflect pneumonia or pulmonary infarction new since the previous studies. There is no pleural effusion nor pulmonary parenchymal mass.  There are degenerative changes of the lower thoracic discs. There is no compression fracture. The sternum and observed ribs exhibit no acute abnormalities.  CT ABDOMEN and PELVIS FINDINGS  The liver, gallbladder, pancreas, spleen, right adrenal gland, and kidneys are normal. There is stable mild enlargement of the left adrenal gland. There is no bulky periaortic or pericaval lymphadenopathy. The abdominal aorta exhibits mural thrombus and calcified plaque, but no evidence of aneurysm. The stomach contains a PEG tube and is of partially distended with gas. The GE junction region remains prominent. No perigastric lymphadenopathy is demonstrated. The small bowel is normal. There is a moderate stool burden within the colon without evidence of  obstruction. The rectum is mildly distended with gas and stool. The urinary bladder is unremarkable. The uterus is surgically absent. The patient has undergone previous posterior fusion at L3-4 and L4-5. The vertebral bodies are preserved in height. The bony pelvis is unremarkable.  Review of the MIP images confirms the above findings.  IMPRESSION: 1. There are small emboli within branches of the right lower lobe pulmonary artery posteriorly without evidence of significant right heart strain. There is small amount of adjacent presumed infarct versus pneumonia. 2. There is mild thickening of the wall of the  distal third of the esophagus without evidence of a discrete mass or perforation. 3. There is no evidence of CHF nor pulmonary parenchymal masses. There is no pleural effusion. 4. No acute intra-abdominal abnormality is demonstrated. There are atherosclerotic changes of the abdominal aorta without evidence of dissection or aneurysm. There is stable mild enlargement of the left adrenal gland. 5. These results were called by telephone at the time of interpretation on 07/21/2014 at 12:59 pm to Dr. Isla Pence MD, who verbally acknowledged these results.   Electronically Signed By: David Martinique On: 07/21/2014 12:59   CLINICAL DATA: Initial treatment strategy for esophageal carcinoma.  EXAM: NUCLEAR MEDICINE PET SKULL BASE TO THIGH  TECHNIQUE: 11.0 mCi F-18 FDG was injected intravenously. Full-ring PET imaging was performed from the skull base to thigh after the radiotracer. CT data was obtained and used for attenuation correction and anatomic localization.  FASTING BLOOD GLUCOSE: Value: 73 mg/dl  COMPARISON: CT 03/31/2014  FINDINGS: NECK  No hypermetabolic lymph nodes in the neck.  CHEST  There is a long segment of intense hypermetabolic activity associated with the esophagus. This extends from relate just superior to the carina through the GE junction with SUV  max equal 8.5. There is a hypermetabolic mass at the esophageal junction extending to the gastric cardia with SUV max equals 17.3. No hypermetabolic mediastinal lymph nodes. No suspicious pulmonary nodules.  ABDOMEN/PELVIS  Hypermetabolic mass in the gastric cardia described above. There is no hypermetabolic gastro hepatic ligament lymph nodes. No abnormal metabolic activity within the liver.  No hypermetabolic abdominal pelvic lymph nodes  SKELETON  No focal hypermetabolic activity to suggest skeletal metastasis.  IMPRESSION: 1. Hypermetabolic mass in the gastric cardia and esophageal junction consists with primary esophageal / gastric carcinoma. 2. Long segment intense metabolic activity associated with the mid and distal esophagus. Differential includes esophageal carcinoma versus esophagitis. 3. No evidence of hypermetabolic mediastinal or gastrohepatic ligament lymph nodes. 4. No evidence of liver metastasis. 5. No distant metastasis   Electronically Signed By: Suzy Bouchard M.D. On: 04/21/2014 13:57    CLINICAL DATA: Difficulty swallowing and epigastric pain. Esophageal disorder. Prior appendectomy and hysterectomy.  EXAM: CT CHEST AND ABDOMEN WITH CONTRAST  TECHNIQUE: Multidetector CT imaging of the chest and abdomen was performed following the standard protocol during bolus administration of intravenous contrast.  CONTRAST: 80 cc Isovue 370  COMPARISON: Abdominal pelvic CT 02/10/2014. Chest radiograph 04/07/2013. Chest CT 04/23/2008.  FINDINGS: CT CHEST FINDINGS  Lungs/Pleura: No nodules or airspace opacities. No pleural fluid.  Heart/Mediastinum: No supraclavicular adenopathy. Mildly age advanced aortic and branch vessel atherosclerosis. Normal heart size with lipomatous hypertrophy of the interatrial septum. Multivessel coronary artery atherosclerosis. No mediastinal or hilar adenopathy. Air contrast level in the thoracic esophagus on image 28  of series 2 and more superiorly on image 19/series 2.  CT ABDOMEN FINDINGS  Abdomen: Normal liver, spleen. Redemonstration of soft tissue fullness involving the gastroesophageal junction and proximal stomach. Example images 37-42 of series 2. Suspect fluid/gastric contents with soft tissue density in the gastric cardia/body junction on image 39/series 2.  Normal pancreas, gallbladder, biliary tract. Mild right adrenal thickening and left adrenal nodularity are grossly similar back to 2009. Suspect an underlying left adrenal adenoma at 1.3 cm. 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 Normal right kidney. Advanced aortic and branch vessel atherosclerosis. Ulcerative plaque within the infrarenal aorta, including on image 62/series 2. Beam hardening artifact from spinal hardware. No retroperitoneal or retrocrural adenopathy. Normal colon and terminal ileum. Normal  abdominal small bowel without ascites. No evidence of omental or peritoneal disease.  Bones/Musculoskeletal: L3-5 lumbar spine fixation. Lower thoracic degenerative disc disease.  IMPRESSION: CT CHEST IMPRESSION  1. No acute process in the chest. 2. Age advanced coronary artery atherosclerosis. Recommend assessment of coronary risk factors and consideration of medical therapy. 3. Mildly dilated thoracic esophagus with contrast within. This suggests a component of esophageal obstruction, dysmotility, or gastroesophageal reflux disease.  CT ABDOMEN AND PELVIS IMPRESSION  1. Redemonstration of soft tissue fullness at the distal esophagus and proximal stomach. Cannot exclude gastritis or gastroesophageal carcinoma. If not already performed, endoscopy is recommended. 2. Advanced abdominal aortic and branch vessel atherosclerosis.   Electronically Signed By: Abigail Miyamoto M.D. On: 03/31/2014 14:14   Pretreatment EUS: Endoscopic findings: 1. Malignant mass in distal esophagus. The mass was  circumferential, patially obstructing but I was able to fairly easily advance echoendoscope through the strictured lumen. The proximal edge was 34 cm from the incisors and distal edge (just below the GE junction) was at 39 cm. The mass is 5cm long. EUS findings: 1. The mass above corresponded with a hypoechoic, heterogeneous mass that clearly passes into and through the muscularis propria layer of the esophageal wall (uT3). 2. There were two round, well demarcated, 5-22mm, hypoechoic paraesophageal lymphnodes that lay directly adjacent to the mass that are suspicious for malignant involvement (uN1). 3. No celiac adenopathy. Impression: uT3N1 (clinical stage IIIa) 5cm long, cirumferential GE junction adenocarcinoma with proximal edge at 34cm from incisors and distal edge just below the GE junction.   Post Treatment EUS: Endoscopic findings: 1. The previously noted (2015) GE junction malignancy was smaller now, but still clearly present. It was 2-3cm long, non-circumferential, ulcerated, located at GE junction (37cm from incisors). EUS findings: 1. The mass above correlated with a hypoechoic, heterogeneous lesion that clearly passed into and through the muscularis propria layer of the distal esophagus, GE junction wall (uT3). 2. The was no paraesophageal, mediastinal, celiac adenopathy (uN0). ENDOSCOPIC IMPRESSION: uT3N0 (stage IIa) non-circumferential, 2-3cm long, ulcerated GE junction adenocarcinoma. This appears to have responded to neoadjuvant chemo (was previously staged IIIa) and so she may be a candidate for surgical resection   Recent Lab Findings: Lab Results  Component Value Date   WBC 12.3* 06/16/2015      Assessment / Plan:   Patient status post transhiatal total esophagectomy with cervical esophagogastrostomy, postoperatively complicated by anastomotic leak now healed  Final pathologic stage IIb. Stop tube feeding and see how does with po intake only I plan to see  her back in 4 weeks Patient reports she is to have a CT scan of the chest and abdomen in November.  Grace Isaac MD      [address] [place] [address] Office [phone]   Beeper [phone]  08/18/2015 4:42 PM

## 2015-08-18 NOTE — Telephone Encounter (Signed)
Patient was switched from [place] to eliquis by Ellwood Handler PA/Dr Servando Snare. Ok to refill under Dr Johnsie Cancel? Please advise. Thanks, MI

## 2015-08-23 IMAGING — RF DG ESOPHAGUS
6 series · 14 of 15 positions shown · non-contrast
Comparison: 05/30/2015.

CLINICAL DATA: 68-year-old female with history of esophageal cancer
status post esophagectomy gastric pull-through, complicated by
anastomotic leak. Followup study.

EXAM:
ESOPHOGRAM/BARIUM SWALLOW
TECHNIQUE: Single contrast examination was performed using  water-soluble.
FLUOROSCOPY TIME:  If the device does not provide the exposure
index:
Fluoroscopy Time:  1 minutes and 6 seconds
Number of Acquired Images:  6 series

[Series 30: cp_standard · 0.34mm/px · 4 of 39 frames shown (1 of 3)]
[frame 6/39]
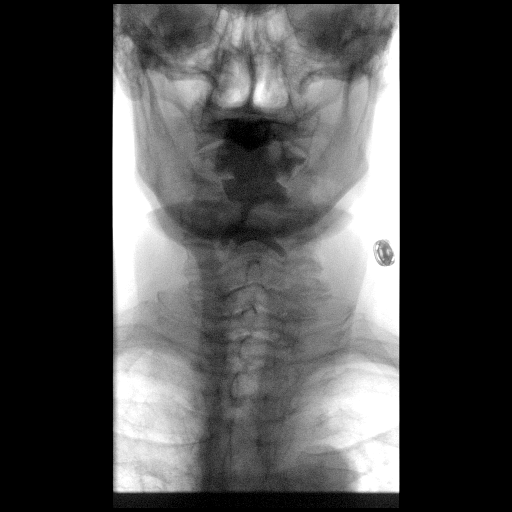
[frame 20/39]
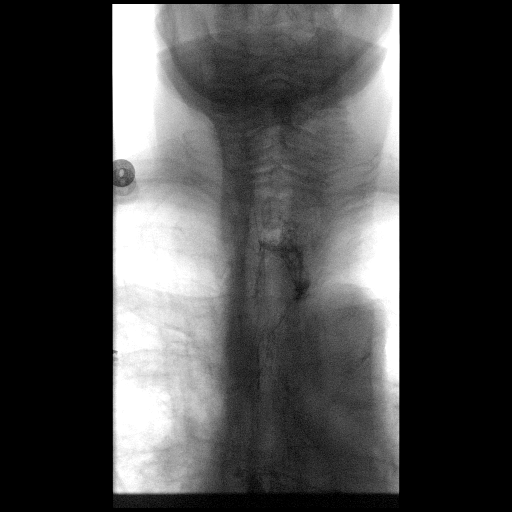
[frame 30/39]
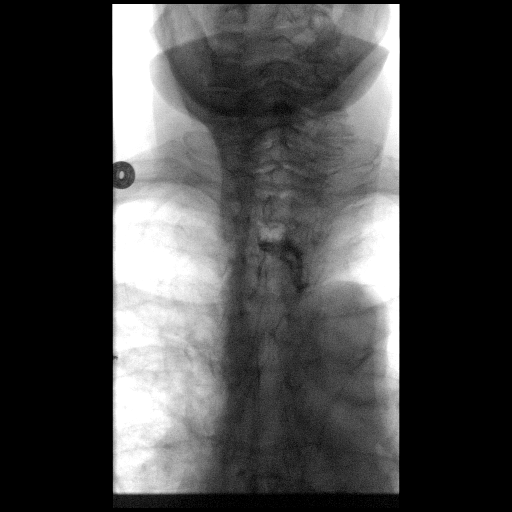
[frame 34/39]
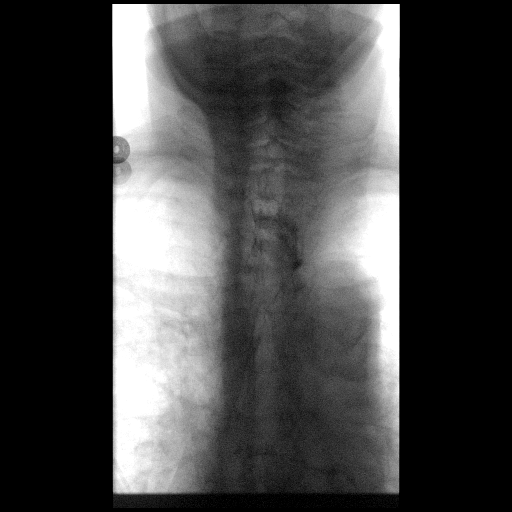

[Series 31: cp_standard · 0.34mm/px · 3 of 103 frames shown (2 of 3)]
[frame 1/103]
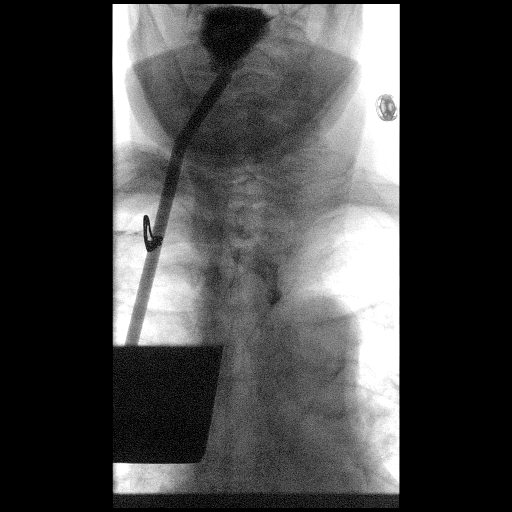
[frame 16/103]
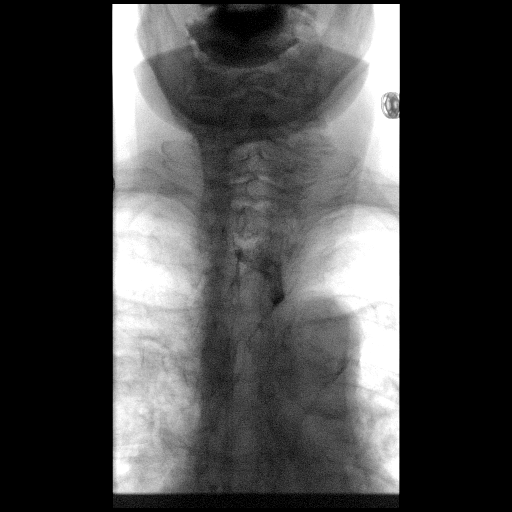
[frame 52/103]
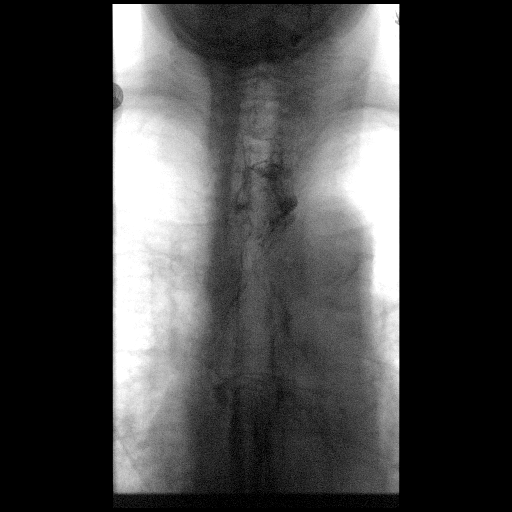

[Series 32: fluoro_barium 2fps_bw · 0.17mm/px · 1 of 1 slices shown (1 of 3)]
[im 1/1]
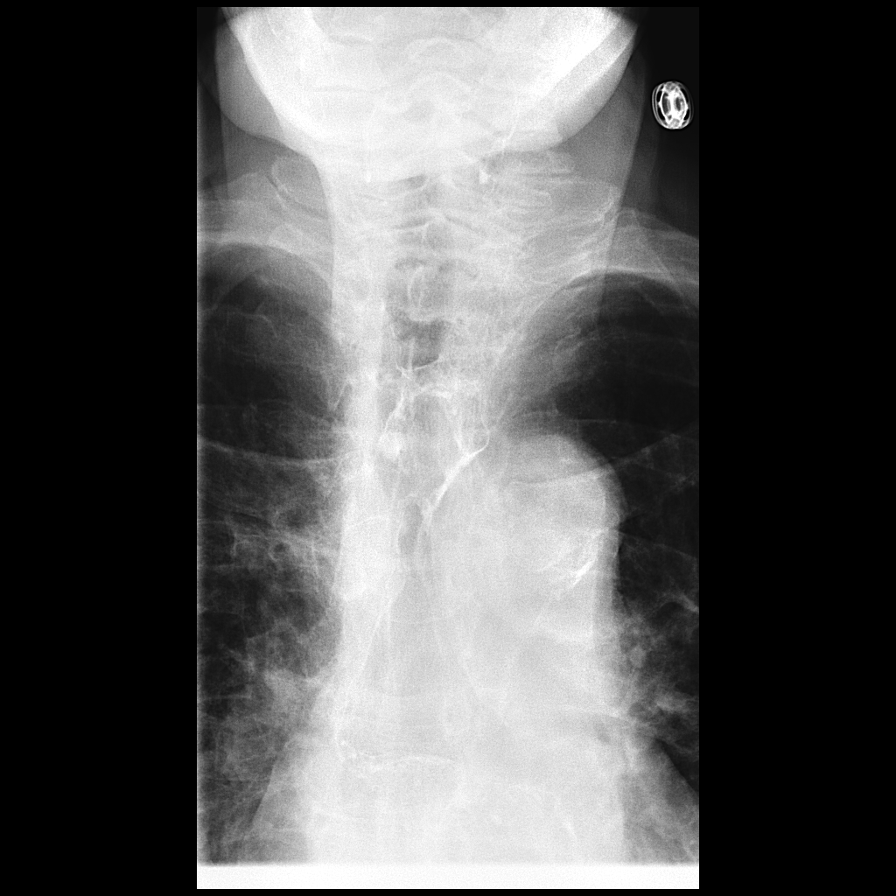

[Series 33: fluoro_barium 2fps_bw · 0.17mm/px · 1 of 1 slices shown (2 of 3)]
[im 1/1]
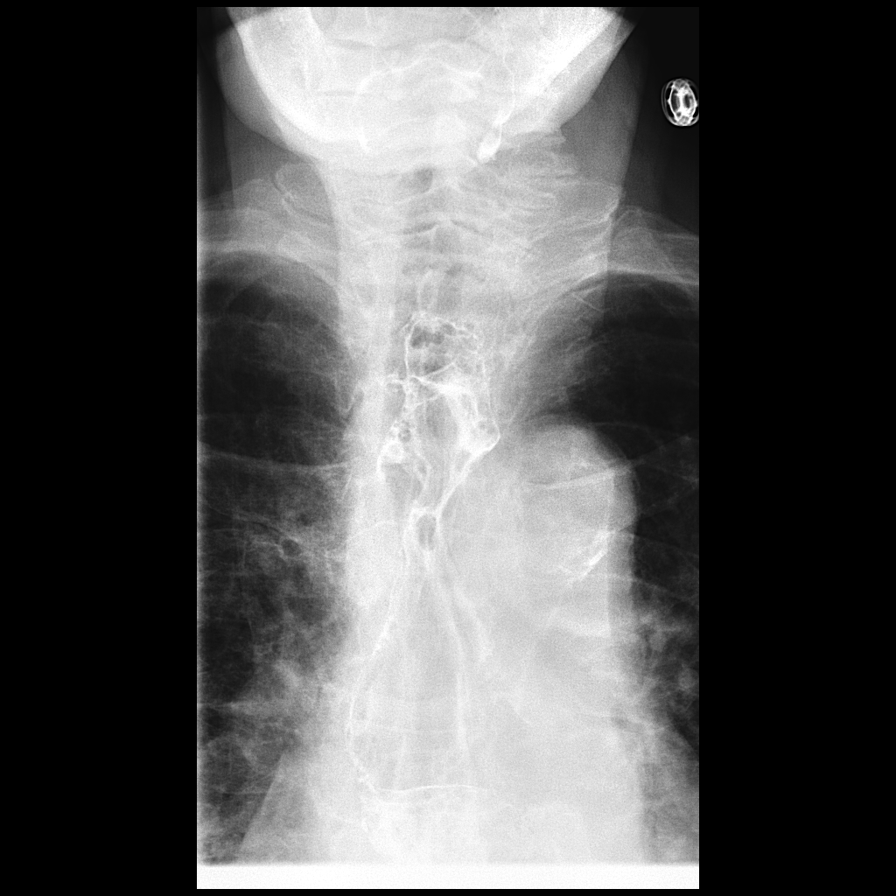

[Series 34: fluoro_barium 2fps_bw · 0.17mm/px · 1 of 1 slices shown (3 of 3)]
[im 1/1]
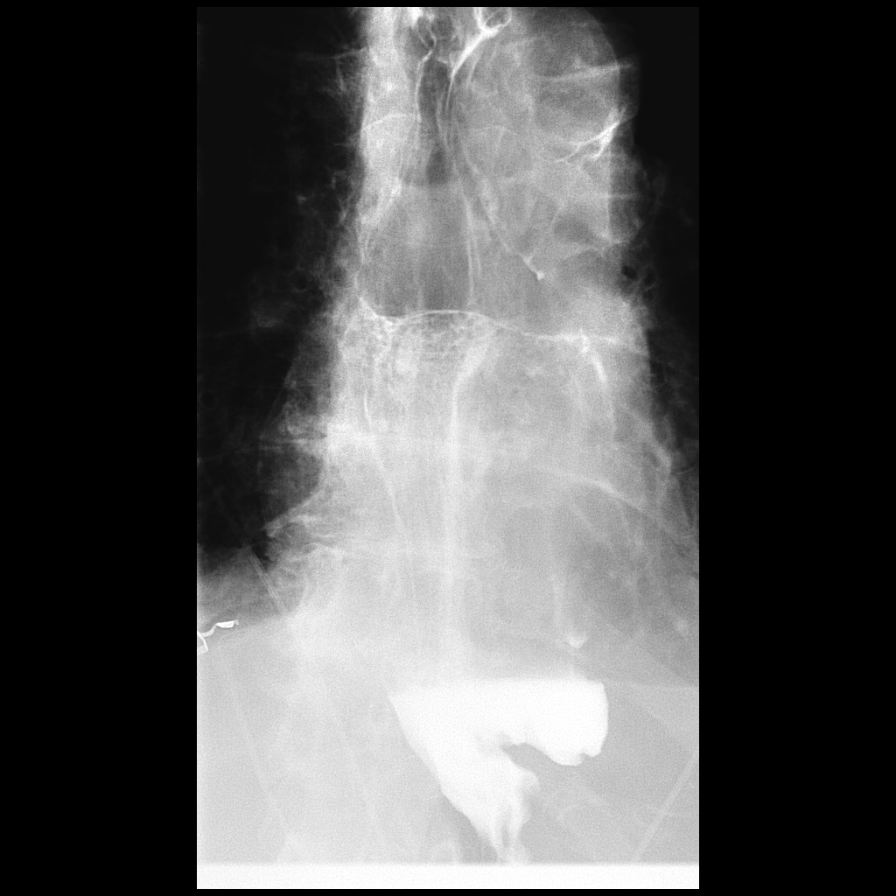

[Series 35: cp_standard · 0.34mm/px · 4 of 83 frames shown (3 of 3)]
[frame 3/83]
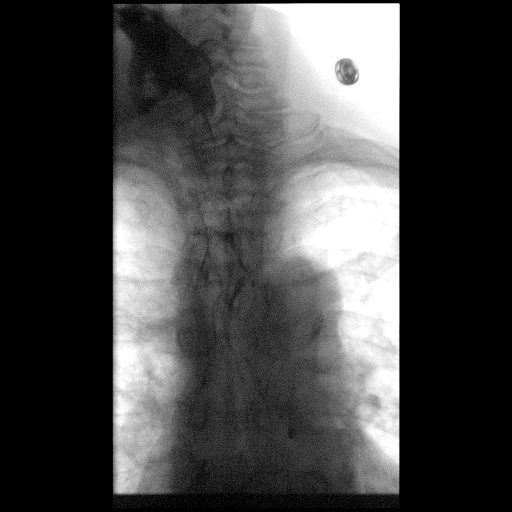
[frame 13/83]
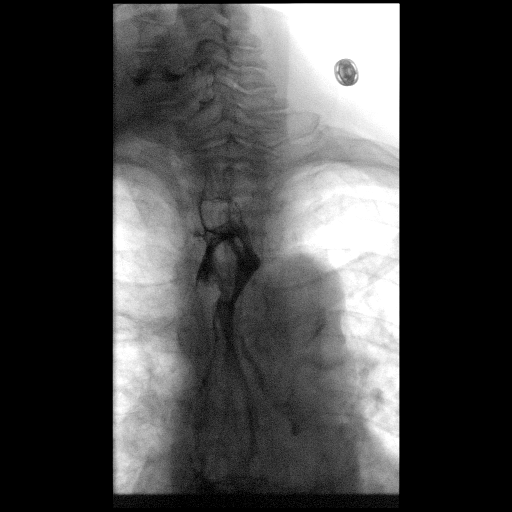
[frame 42/83]
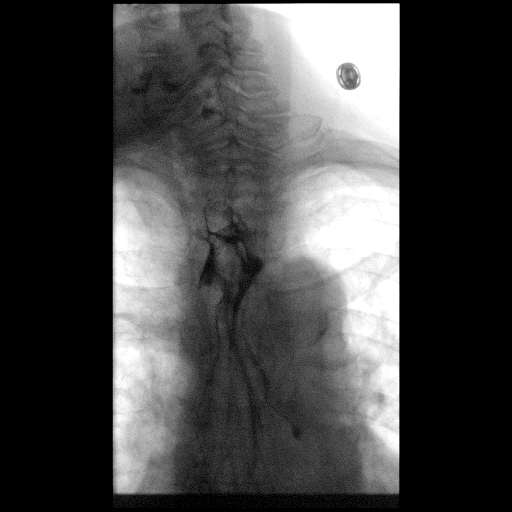
[frame 71/83]
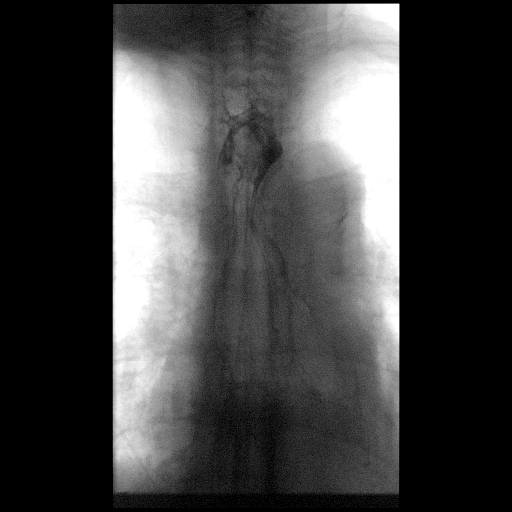

[14 of 15 positions shown; findings below may reference images not displayed]

FINDINGS: Multiple swallows were observed, which again demonstrated
postoperative anatomy of esophagectomy with gastric pull-through.
The anastomosis immediately above the level of the aortic arch was
more normal in appearance on today's examination. Specifically, no
extravasation of contrast was noted at any point during today's
study comment even during multiple repeated swallows.
IMPRESSION: 1. Expected postoperative appearance of esophagectomy and gastric
pull through, with no evidence of residual anastomotic leak.

## 2015-08-29 ENCOUNTER — Encounter: Payer: Self-pay | Admitting: Cardiothoracic Surgery

## 2015-08-29 ENCOUNTER — Ambulatory Visit (INDEPENDENT_AMBULATORY_CARE_PROVIDER_SITE_OTHER): Payer: Commercial Managed Care - PPO | Admitting: Cardiothoracic Surgery

## 2015-08-29 VITALS — BP 125/70 | HR 70 | Resp 20 | Ht 61.0 in | Wt 167.0 lb

## 2015-08-29 DIAGNOSIS — Z923 Personal history of irradiation: Secondary | ICD-10-CM

## 2015-08-29 DIAGNOSIS — C155 Malignant neoplasm of lower third of esophagus: Secondary | ICD-10-CM

## 2015-08-29 DIAGNOSIS — Z9221 Personal history of antineoplastic chemotherapy: Secondary | ICD-10-CM

## 2015-08-29 DIAGNOSIS — C159 Malignant neoplasm of esophagus, unspecified: Secondary | ICD-10-CM | POA: Diagnosis not present

## 2015-08-29 DIAGNOSIS — Z5189 Encounter for other specified aftercare: Secondary | ICD-10-CM | POA: Diagnosis not present

## 2015-08-29 NOTE — Progress Notes (Signed)
[address]       [place] [address]             [facility] Record #[id number] Date of Birth: 08/02/1946  Referring: Marice Potter, MD Primary Care: Lillard Anes, MD  Chief Complaint:    Chief Complaint  Patient presents with  . Routine Post Op    3 week f/u   05/10/2015 DATE OF DISCHARGE:  OPERATIVE REPORT PREOPERATIVE DIAGNOSIS: Adenocarcinoma of the distal esophagus. POSTOPERATIVE DIAGNOSIS: Adenocarcinoma of the distal esophagus. PROCEDURE: Video bronchoscopy, transhiatal total esophagectomy with cervical esophagogastrostomy, pyloroplasty, feeding jejunostomy. SURGEON: Lanelle Bal, MD.  Esophageal cancer   Staging form: Esophagus - Adenocarcinoma, AJCC 7th Edition     Pathologic stage from 05/13/2015: Stage IIB (T3, N0, cM0, G3 - Poorly differentiated) - Signed by Grace Isaac, MD on 05/16/2015       History of Present Illness:    Sharon Price 69 y.o. female is seen in the office in follow-up after resection of her adenocarcinoma the distal esophagus. She underwent surgery on June 12, did develop a cervical anastomotic leak which gradually healed and has now closed. She is now at home on jejunal tube feedings at night and soft diet during the day. Since discharge she's had no fever chills and appears to be progressing well. Recently she has been gaining weight.   Originally she was diagnosed with clinical stage IIIa T3 N1 MO esophageal cancer for an ulcerated adenocarcinoma [place] [facility] [phone]. The patient has completed neoadjuvant chemotherapy radiation with weekly carboplatin and and paclltaxel in AUG 2015.   Patient is no longer smoking.  Patient continues on request for history of pulmonary embolus in the fall of 2015  Patient describes some bile reflux early in the morning when she first gets up.  Eating well since stopping tube feeding  Current  Activity/ Functional Status:  Patient is independent with mobility/ambulation, transfers, ADL's, IADL's.   Zubrod Score: At the time of surgery this patient's most appropriate activity status/level should be described as: []     0    Normal activity, no symptoms [x]     1    Restricted in physical strenuous activity but ambulatory, able to do out light work []     2    Ambulatory and capable of self care, unable to do work activities, up and about               >50 % of waking hours                              []     3    Only limited self care, in bed greater than 50% of waking hours []     4    Completely disabled, no self care, confined to bed or chair []     5    Moribund   Past Medical History  Diagnosis Date  . Chronic kidney disease     renal..STAGE 2  . Hypertension   . Mechanical dysphagia     MOSTLY SOLIDS  . Pulmonary emboli ([place]) 9/223/15 [facility]    RIGHT LOWER LOBE PER CT CHEST   . Patient on combined chemotherapy and radiation     [facility]  .  Cancer ([place]) 03/30/14    GE JUNCTION  . Transfusion history     ?[facility].  . Family history of adverse reaction to anesthesia     Patients sister is very nauseated after anesthesia  . Pneumonia   . Diabetes mellitus without complication (HCC)     Type 2  . Hyperlipemia   . GERD (gastroesophageal reflux disease)     Past Surgical History  Procedure Laterality Date  . Back surgery    . Abdominal hysterectomy    . Tubal ligation    . Eus N/A 04/08/2014    Procedure: ESOPHAGEAL ENDOSCOPIC ULTRASOUND (EUS) RADIAL;  Surgeon: Milus Banister, MD;  Location: [facility];  Service: Endoscopy;  Laterality: N/A;  . Appendectomy    . Spine surgery    . Lumbar fusion    . Esophagogastroduodenoscopy (egd) with propofol N/A 12/02/2014    Procedure: ESOPHAGOGASTRODUODENOSCOPY (EGD) WITH PROPOFOL;  Surgeon: Jerene Bears, MD;  Location: [facility];  Service: Endoscopy;   Laterality: N/A;  . Eus N/A 03/31/2015    Procedure: UPPER ENDOSCOPIC ULTRASOUND (EUS) RADIAL;  Surgeon: Milus Banister, MD;  Location: [facility];  Service: Endoscopy;  Laterality: N/A;  . Cataract extraction Bilateral   . Video bronchoscopy N/A 05/10/2015    Procedure: VIDEO BRONCHOSCOPY;  Surgeon: Grace Isaac, MD;  Location: [place];  Service: Thoracic;  Laterality: N/A;  . Complete esophagectomy N/A 05/10/2015    Procedure: TRANSHIATAL TOTAL ESOPHAGEAL RESECTION;  Surgeon: Grace Isaac, MD;  Location: [place];  Service: Thoracic;  Laterality: N/A;  . Jejunostomy N/A 05/10/2015    Procedure: FEEDING JEJUNOSTOMY;  Surgeon: Grace Isaac, MD;  Location: [place];  Service: Thoracic;  Laterality: N/A;  . Mass excision Left 05/20/2015    Procedure: EXPLORATION OF LEFT NECK;  Surgeon: Grace Isaac, MD;  Location: [place];  Service: Thoracic;  Laterality: Left;    Family History  Problem Relation Age of Onset  . Diabetes Mother   . Hypertension Mother   . Stroke Mother   . Heart disease Mother   . Hypertension Father   . Diabetes Sister   . Cancer Sister     THYROID  . Cancer Brother     LUNG  . Diabetes Daughter   . Diabetes Son     Social History   Social History  . Marital Status: Widowed    Spouse Name: N/A  . Number of Children: N/A  . Years of Education: N/A   Occupational History  . Not on file.   Social History Main Topics  . Smoking status: Former Smoker -- 1.00 packs/day for 0 years    Quit date: 03/02/2015  . Smokeless tobacco: Not on file     Comment: 3 weeks- < 1ppd.  . Alcohol Use: No  . Drug Use: No  . Sexual Activity: Not on file   Other Topics Concern  . Not on file   Social History Narrative    History  Smoking status  . Former Smoker -- 1.00 packs/day for 0 years  . Quit date: 03/02/2015  Smokeless tobacco  . Not on file    Comment: 3 weeks- < 1ppd.    History  Alcohol Use No     No Known Allergies  Current Outpatient  Prescriptions  Medication Sig Dispense Refill  . Albuterol Sulfate (PROAIR RESPICLICK IN) Inhale 1 puff into the lungs as needed (for coughing/ shortness of breath).     Marland Kitchen amiodarone (CORDARONE) 5  mg/mL SUSP Take 100 mg by mouth daily.    Marland Kitchen amiodarone (PACERONE) 100 MG tablet Take 1 tablet (100 mg total) by mouth daily. 30 tablet 1  . chlorpheniramine-HYDROcodone (TUSSIONEX) 10-8 MG/5ML SUER Place 5 mLs into feeding tube every 12 (twelve) hours as needed for cough. 140 mL 0  . ELIQUIS 5 MG TABS tablet TAKE 1 TABLET BY MOUTH TWICE DAILY. 60 tablet 11  . ergocalciferol (VITAMIN D2) 50000 UNITS capsule Take 50,000 Units by mouth every 30 (thirty) days. Takes around the 1st or 2nd of month.    . Hydrocortisone (Mardi Cannady'S BUTT CREAM) CREA Apply 1 application topically as needed for irritation.    . insulin glargine (LANTUS) 100 UNIT/ML injection Inject 0.1 mLs (10 Units total) into the skin at bedtime. (Patient taking differently: Inject 35 Units into the skin at bedtime. ) 10 mL 5  . lisinopril (PRINIVIL,ZESTRIL) 10 MG tablet Take 1 tablet (10 mg total) by mouth daily. 90 tablet 3  . metFORMIN (GLUCOPHAGE) 500 MG tablet Take 1 tablet (500 mg total) by mouth 2 (two) times daily with a meal.    . metoCLOPramide (REGLAN) 5 MG/5ML solution Take 10 mLs (10 mg total) by mouth every 6 (six) hours. 473 mL 3  . metoprolol tartrate (LOPRESSOR) 25 mg/10 mL SUSP Take 5 mLs (12.5 mg total) by mouth 2 (two) times daily. 300 mL 3  . oxyCODONE (ROXICODONE) 5 MG/5ML solution Take 5 mLs (5 mg total) by mouth every 4 (four) hours as needed for severe pain. 473 mL 0  . PAIN & FEVER CHILDRENS 160 MG/5ML solution TAKE 10.2ML BY MOUTH EVERY 4 HOURS AS NEEDED FOR MODERATE PAIN OR HEADACHE. 473 mL 0  . pantoprazole sodium (PROTONIX) 40 mg/20 mL PACK Take 20 mLs (40 mg total) by mouth 2 (two) times daily. 60 each 3  . promethazine (PHENERGAN) 12.5 MG tablet Take 1 tablet (12.5 mg total) by mouth every 8 (eight) hours as needed  for nausea, vomiting or refractory nausea / vomiting. 30 tablet 0  . sennosides (SENOKOT) 8.8 MG/5ML syrup Take 10 mLs by mouth 2 (two) times daily. 240 mL 5  . simvastatin (ZOCOR) 40 MG tablet Take 40 mg by mouth daily.     No current facility-administered medications for this visit.     Review of Systems:     Cardiac Review of Systems: Y or N  Chest Pain [  y  ]  Resting SOB [ n  ] Exertional SOB  [ y ]  Orthopnea [  ]   Pedal Edema [   ]    Palpitations [  ] Syncope  [  ]   Presyncope [   ]  General Review of Systems: [Y] = yes [  ]=no Constitional: recent weight change [ gained wt ];  Wt loss over the last 3 months [ ]  anorexia [  ]; fatigue [ y ]; nausea [ y ]; night sweats [  ]; fever [  ]; or chills [  ];          Dental: poor dentition[ n ]; Last Dentist visit:   Eye : blurred vision [  ]; diplopia [   ]; vision changes [  ];  Amaurosis fugax[  ]; Resp: cough [  ];  wheezing[  ];  hemoptysis[ n ]; shortness of breath[ n ]; paroxysmal nocturnal dyspnea[  ]; dyspnea on exertion[ y ]; or orthopnea[  ];  GI:  gallstones[  ], vomiting[n  ];  dysphagia[y  ];  melena[  ];  hematochezia [ y ]; heartburn[ y ];   Hx of  Colonoscopy[  ]; GU: kidney stones [  ]; hematuria[  ];   dysuria [  ];  nocturia[  ];  history of     obstruction [  ]; urinary frequency [  ]             Skin: rash, swelling[  ];, hair loss[  ];  peripheral edema[  ];  or itching[  ]; Musculosketetal: myalgias[  ];  joint swelling[  ];  joint erythema[  ];  joint pain[  ];  back pain[  ];  Heme/Lymph: bruising[  ];  bleeding[n  ];  anemia[  ];  Neuro: TIA[  ];  headaches[  ];  stroke[  ];  vertigo[  ];  seizures[  ];   paresthesias[  ];  difficulty walking[ n ];  Psych:depression[  ]; anxiety[ y ];  Endocrine: diabetes[  ];  thyroid dysfunction[  ];  Immunizations: Flu up to date [ n ]; Pneumococcal up to date [facility]  ];  Other:  Physical Exam: BP 125/70 mmHg  Pulse 70  Resp 20  Ht 5\' 1"  (1.549 m)  Wt 167 lb (75.751 kg)   BMI 31.57 kg/m2  SpO2 96%   Wt Readings from Last 3 Encounters:  08/29/15 167 lb (75.751 kg)  08/18/15 164 lb (74.39 kg)  08/08/15 169 lb (76.658 kg)   weight up from 132  PHYSICAL EXAMINATION: BP 125/70 mmHg  Pulse 70  Resp 20  Ht 5\' 1"  (1.549 m)  Wt 167 lb (75.751 kg)  BMI 31.57 kg/m2  SpO2 96%   General appearance: alert, cooperative, appears older than stated age, cachectic and fatigued Neurologic: intact Heart: Appears to be in a regular rhythm today Lungs: diminished breath sounds bibasilar Abdomen: Abdomen mildly distended with intact G-tube in the left upper abdomen Extremities: extremities normal, atraumatic, no cyanosis or edema and Homans sign is negative, no sign of DVT Wound:Abdominal incision is well-healed , the left neck incision is also well-healed without evidence of drainage  I do not appreciate cervical or supraclavicular adenopathy  Her abdominal incision has one small area of granulation tissue treated with silver nitrate topicially , the left neck incisions healed without drainage on exam today, the jejunostomy tube was removed in office today  I   Diagnostic Studies & Laboratory data:     Recent Radiology Findings: Dg Abd 1 View  08/16/2015  CLINICAL DATA:  Assess J-tube repositioning.  Initial encounter. EXAM: ABDOMEN - 1 VIEW COMPARISON:  Abdominal radiograph performed 05/18/2015 FINDINGS: The patient's J-tube is noted ending overlying the left mid abdomen, with injected contrast filling the jejunum. The visualized bowel gas pattern is grossly unremarkable. No free intra-abdominal air is seen, though evaluation for free air is limited on a single supine view. No acute osseous abnormalities are identified. The patient is status post lumbar spinal fusion at L3-L5. IMPRESSION: Injection of contrast into the J-tube fills the jejunum, as expected. Electronically Signed   By: Garald Balding M.D.   On: 08/16/2015 02:24   Dg Chest 2 View  06/23/2015    CLINICAL DATA:  History of esophageal cancer, status post surgery 07/06/2015, status post radiation and chemotherapy  EXAM: CHEST  2 VIEW  COMPARISON:  06/15/2017  FINDINGS: Increased interstitial markings. No focal consolidation. Patchy left lower lobe opacity, likely atelectasis. Suspected small left pleural effusion.  Heart is normal in size.  Degenerative changes of the visualized  thoracolumbar spine. Lumbar spine fixation hardware.  IMPRESSION: Patchy left lower lobe opacity, likely atelectasis. Suspected small left pleural effusion.   Electronically Signed   By: Julian Hy M.D.   On: 06/23/2015 10:52    07:34   Dg Esophagus W/water Sol Cm  06/16/2015   CLINICAL DATA:  69 year old female with history of esophageal cancer status post esophagectomy gastric pull-through, complicated by anastomotic leak. Followup study.  EXAM: ESOPHOGRAM/BARIUM SWALLOW  TECHNIQUE: Single contrast examination was performed using  water-soluble.  FLUOROSCOPY TIME:  If the device does not provide the exposure index:  Fluoroscopy Time:  1 minutes and 6 seconds  Number of Acquired Images:  6 series  COMPARISON:  05/30/2015.  FINDINGS: Multiple swallows were observed, which again demonstrated postoperative anatomy of esophagectomy with gastric pull-through. The anastomosis immediately above the level of the aortic arch was more normal in appearance on today's examination. Specifically, no extravasation of contrast was noted at any point during today's study comment even during multiple repeated swallows.  IMPRESSION: 1. Expected postoperative appearance of esophagectomy and gastric pull through, with no evidence of residual anastomotic leak.   Electronically Signed   By: Vinnie Langton M.D.   On: 06/16/2015 08:53   Nm Pet Image Restag (ps) Skull Base To Thigh  03/24/2015   CLINICAL DATA:  Subsequent treatment strategy for esophageal cancer.  EXAM: NUCLEAR MEDICINE PET SKULL BASE TO THIGH  TECHNIQUE: 7.4 mCi F-18 FDG was  injected intravenously. Full-ring PET imaging was performed from the skull base to thigh after the radiotracer. CT data was obtained and used for attenuation correction and anatomic localization.  FASTING BLOOD GLUCOSE:  Value: 219 mg/dl  COMPARISON:  CTs of the neck, abdomen and pelvis 03/07/2015. Chest CT 11/29/2014.  FINDINGS: NECK  No hypermetabolic cervical lymph nodes are identified.There are no lesions of the pharyngeal mucosal space. There is low-level activity associated with the muscles of phonation, within physiologic limits.  CHEST  There are no hypermetabolic mediastinal, hilar or axillary lymph nodes. There is focal hypermetabolic activity at the gastroesophageal junction, extending over a short segment. This has an SUV max of 5.5. Underlying wall thickening in this region appears grossly stable without well-defined mass. There is no suspicious pulmonary activity or suspicious pulmonary nodule. Mild emphysema, basilar interstitial prominence and small pleural effusions are noted.  ABDOMEN/PELVIS  There is no hypermetabolic activity within the liver, adrenal glands, spleen or pancreas. There is no hypermetabolic nodal activity. Pancreatic atrophy and aortoiliac atherosclerosis noted.  SKELETON  There is no hypermetabolic activity to suggest osseous metastatic disease. There are postsurgical changes within the lumbar spine.  IMPRESSION: 1. Nonspecific short segment hypermetabolic activity at the gastroesophageal junction may be related to treated tumor and/or radiation change. 2. No evidence of metastatic disease. There is no abnormal activity within the liver or adjacent lymph nodes.   Electronically Signed   By: Richardean Sale M.D.   On: 03/24/2015 10:20  I have independently reviewed the above radiology studies  and reviewed the findings with the patient.  Final Report  03/07/2015  CLINICAL DATA: Esophageal cancer. Chemotherapy and radiation therapy completed August 2015.  EXAM: CT ABDOMEN  AND PELVIS WITH CONTRAST  TECHNIQUE: Multidetector CT imaging of the abdomen and pelvis was performed using the standard protocol following bolus administration of intravenous contrast.  CONTRAST: 100 mL Isovue  COMPARISON: PET-CT scan 04/21/2014, CT scan 11/16/2014  FINDINGS: Lower chest: Lung bases are clear.  Hepatobiliary: No focal hepatic lesion. The gallbladder is collapsed.  Pancreas:  Pancreas is normal. No ductal dilatation. No pancreatic inflammation.  Spleen: Normal spleen  Adrenals/urinary tract: Mild nodularity of adrenal glands is unchanged. Kidneys are normal. The ureters and bladder normal.  Stomach/Bowel: There is thickening of the distal esophagus similar to prior. Stomach, small bowel, and colon are unremarkable.  Vascular/Lymphatic: Atherosclerotic calcification aorta. No periportal retroperitoneal lymphadenopathy. No gastrohepatic ligament lymphadenopathy. No pelvic adenopathy or inguinal adenopathy.  Reproductive: Post hysterectomy.  Musculoskeletal: No aggressive osseous lesion. Posterior lumbar fusion  Other: No peritoneal metastasis.  IMPRESSION: 1. No evidence of esophageal cancer recurrence or metastasis in the abdomen pelvis. 2. Stable thickening distal esophagus likely relates radiation change. 3. Atherosclerotic calcification of the aorta.   Electronically Signed By: Suzy Bouchard M.D. On: 03/07/2015 16:43    CLINICAL DATA: Shortness of breath; history of esophageal malignancy, PEG tube placement, appendectomy, hysterectomy, and lumbar fusion.  EXAM: CT ANGIOGRAPHY CHEST  CT ABDOMEN AND PELVIS WITH CONTRAST  TECHNIQUE: Multidetector CT imaging of the chest was performed using the standard protocol during bolus administration of intravenous contrast. Multiplanar CT image reconstructions and MIPs were obtained to evaluate the vascular anatomy. Multidetector CT imaging of the abdomen and pelvis was performed using the  standard protocol during bolus administration of intravenous contrast.  CONTRAST: 100 cc of Isovue 370 intravenously. The patient also received oral contrast material through the PEG tube.  COMPARISON: Portable chest x-ray of today's date and PET-CT study of April 21, 2014 and CT scan of the chest and abdomen abdomen dated March 31, 2014  FINDINGS: CTA CHEST FINDINGS  There are small filling defects within peripheral pulmonary arterial branches to the right lower lobe. There are no filling defects elsewhere on the right nor on the left. The caliber of the thoracic aorta is normal. The cardiac chambers are normal in size. The RV LV ratio is approximately 1. There is no pericardial effusion. There is no lymphadenopathy. There is thickening of the wall of the lower 1/3 of the esophagus without visible discrete mass. There is no free mediastinal fluid or air.  At lung window settings there is no pulmonary parenchymal mass. There is increased density in the posterior medial aspect of the right costophrenic gutter which may reflect pneumonia or pulmonary infarction new since the previous studies. There is no pleural effusion nor pulmonary parenchymal mass.  There are degenerative changes of the lower thoracic discs. There is no compression fracture. The sternum and observed ribs exhibit no acute abnormalities.  CT ABDOMEN and PELVIS FINDINGS  The liver, gallbladder, pancreas, spleen, right adrenal gland, and kidneys are normal. There is stable mild enlargement of the left adrenal gland. There is no bulky periaortic or pericaval lymphadenopathy. The abdominal aorta exhibits mural thrombus and calcified plaque, but no evidence of aneurysm. The stomach contains a PEG tube and is of partially distended with gas. The GE junction region remains prominent. No perigastric lymphadenopathy is demonstrated. The small bowel is normal. There is a moderate stool burden within the colon without  evidence of obstruction. The rectum is mildly distended with gas and stool. The urinary bladder is unremarkable. The uterus is surgically absent. The patient has undergone previous posterior fusion at L3-4 and L4-5. The vertebral bodies are preserved in height. The bony pelvis is unremarkable.  Review of the MIP images confirms the above findings.  IMPRESSION: 1. There are small emboli within branches of the right lower lobe pulmonary artery posteriorly without evidence of significant right heart strain. There is small amount of adjacent presumed infarct versus  pneumonia. 2. There is mild thickening of the wall of the distal third of the esophagus without evidence of a discrete mass or perforation. 3. There is no evidence of CHF nor pulmonary parenchymal masses. There is no pleural effusion. 4. No acute intra-abdominal abnormality is demonstrated. There are atherosclerotic changes of the abdominal aorta without evidence of dissection or aneurysm. There is stable mild enlargement of the left adrenal gland. 5. These results were called by telephone at the time of interpretation on 07/21/2014 at 12:59 pm to Dr. Isla Pence MD, who verbally acknowledged these results.   Electronically Signed By: David Martinique On: 07/21/2014 12:59   CLINICAL DATA: Initial treatment strategy for esophageal carcinoma.  EXAM: NUCLEAR MEDICINE PET SKULL BASE TO THIGH  TECHNIQUE: 11.0 mCi F-18 FDG was injected intravenously. Full-ring PET imaging was performed from the skull base to thigh after the radiotracer. CT data was obtained and used for attenuation correction and anatomic localization.  FASTING BLOOD GLUCOSE: Value: 73 mg/dl  COMPARISON: CT 03/31/2014  FINDINGS: NECK  No hypermetabolic lymph nodes in the neck.  CHEST  There is a long segment of intense hypermetabolic activity associated with the esophagus. This extends from relate just superior to the carina through the GE  junction with SUV max equal 8.5. There is a hypermetabolic mass at the esophageal junction extending to the gastric cardia with SUV max equals 17.3. No hypermetabolic mediastinal lymph nodes. No suspicious pulmonary nodules.  ABDOMEN/PELVIS  Hypermetabolic mass in the gastric cardia described above. There is no hypermetabolic gastro hepatic ligament lymph nodes. No abnormal metabolic activity within the liver.  No hypermetabolic abdominal pelvic lymph nodes  SKELETON  No focal hypermetabolic activity to suggest skeletal metastasis.  IMPRESSION: 1. Hypermetabolic mass in the gastric cardia and esophageal junction consists with primary esophageal / gastric carcinoma. 2. Long segment intense metabolic activity associated with the mid and distal esophagus. Differential includes esophageal carcinoma versus esophagitis. 3. No evidence of hypermetabolic mediastinal or gastrohepatic ligament lymph nodes. 4. No evidence of liver metastasis. 5. No distant metastasis   Electronically Signed By: Suzy Bouchard M.D. On: 04/21/2014 13:57    CLINICAL DATA: Difficulty swallowing and epigastric pain. Esophageal disorder. Prior appendectomy and hysterectomy.  EXAM: CT CHEST AND ABDOMEN WITH CONTRAST  TECHNIQUE: Multidetector CT imaging of the chest and abdomen was performed following the standard protocol during bolus administration of intravenous contrast.  CONTRAST: 80 cc Isovue 370  COMPARISON: Abdominal pelvic CT 02/10/2014. Chest radiograph 04/07/2013. Chest CT 04/23/2008.  FINDINGS: CT CHEST FINDINGS  Lungs/Pleura: No nodules or airspace opacities. No pleural fluid.  Heart/Mediastinum: No supraclavicular adenopathy. Mildly age advanced aortic and branch vessel atherosclerosis. Normal heart size with lipomatous hypertrophy of the interatrial septum. Multivessel coronary artery atherosclerosis. No mediastinal or hilar adenopathy. Air contrast level in the thoracic  esophagus on image 28 of series 2 and more superiorly on image 19/series 2.  CT ABDOMEN FINDINGS  Abdomen: Normal liver, spleen. Redemonstration of soft tissue fullness involving the gastroesophageal junction and proximal stomach. Example images 37-42 of series 2. Suspect fluid/gastric contents with soft tissue density in the gastric cardia/body junction on image 39/series 2.  Normal pancreas, gallbladder, biliary tract. Mild right adrenal thickening and left adrenal nodularity are grossly similar back to 2009. Suspect an underlying left adrenal adenoma at 1.3 cm. 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 Normal right kidney. Advanced aortic and branch vessel atherosclerosis. Ulcerative plaque within the infrarenal aorta, including on image 62/series 2. Beam hardening artifact from spinal hardware.  No retroperitoneal or retrocrural adenopathy. Normal colon and terminal ileum. Normal abdominal small bowel without ascites. No evidence of omental or peritoneal disease.  Bones/Musculoskeletal: L3-5 lumbar spine fixation. Lower thoracic degenerative disc disease.  IMPRESSION: CT CHEST IMPRESSION  1. No acute process in the chest. 2. Age advanced coronary artery atherosclerosis. Recommend assessment of coronary risk factors and consideration of medical therapy. 3. Mildly dilated thoracic esophagus with contrast within. This suggests a component of esophageal obstruction, dysmotility, or gastroesophageal reflux disease.  CT ABDOMEN AND PELVIS IMPRESSION  1. Redemonstration of soft tissue fullness at the distal esophagus and proximal stomach. Cannot exclude gastritis or gastroesophageal carcinoma. If not already performed, endoscopy is recommended. 2. Advanced abdominal aortic and branch vessel atherosclerosis.   Electronically Signed By: Abigail Miyamoto M.D. On: 03/31/2014 14:14   Pretreatment EUS: Endoscopic findings: 1. Malignant mass in distal esophagus.  The mass was circumferential, patially obstructing but I was able to fairly easily advance echoendoscope through the strictured lumen. The proximal edge was 34 cm from the incisors and distal edge (just below the GE junction) was at 39 cm. The mass is 5cm long. EUS findings: 1. The mass above corresponded with a hypoechoic, heterogeneous mass that clearly passes into and through the muscularis propria layer of the esophageal wall (uT3). 2. There were two round, well demarcated, 5-66mm, hypoechoic paraesophageal lymphnodes that lay directly adjacent to the mass that are suspicious for malignant involvement (uN1). 3. No celiac adenopathy. Impression: uT3N1 (clinical stage IIIa) 5cm long, cirumferential GE junction adenocarcinoma with proximal edge at 34cm from incisors and distal edge just below the GE junction.   Post Treatment EUS: Endoscopic findings: 1. The previously noted (2015) GE junction malignancy was smaller now, but still clearly present. It was 2-3cm long, non-circumferential, ulcerated, located at GE junction (37cm from incisors). EUS findings: 1. The mass above correlated with a hypoechoic, heterogeneous lesion that clearly passed into and through the muscularis propria layer of the distal esophagus, GE junction wall (uT3). 2. The was no paraesophageal, mediastinal, celiac adenopathy (uN0). ENDOSCOPIC IMPRESSION: uT3N0 (stage IIa) non-circumferential, 2-3cm long, ulcerated GE junction adenocarcinoma. This appears to have responded to neoadjuvant chemo (was previously staged IIIa) and so she may be a candidate for surgical resection   Recent Lab Findings: Lab Results  Component Value Date   WBC 12.3* 06/16/2015      Assessment / Plan:   Patient status post transhiatal total esophagectomy with cervical esophagogastrostomy, postoperatively complicated by anastomotic leak now healed Final pathologic stage IIb. Taking po diet well off tube feeding, tube removed today I  plan to see her back in 5-6 weeks weeks Patient reports she is to have a CT scan of the chest and abdomen in November.  Grace Isaac MD      [address] [place] [address] Office [phone]   Beeper [phone]  08/29/2015 4:16 PM

## 2015-08-30 ENCOUNTER — Ambulatory Visit: Payer: Medicare Other | Admitting: Cardiothoracic Surgery

## 2015-08-30 IMAGING — CR DG CHEST 2V
2 series · 2 of 2 positions shown · non-contrast
Comparison: 06/15/2017

CLINICAL DATA: History of esophageal cancer, status post surgery
07/06/2015, status post radiation and chemotherapy

EXAM:
CHEST  2 VIEW

[w chest pa]
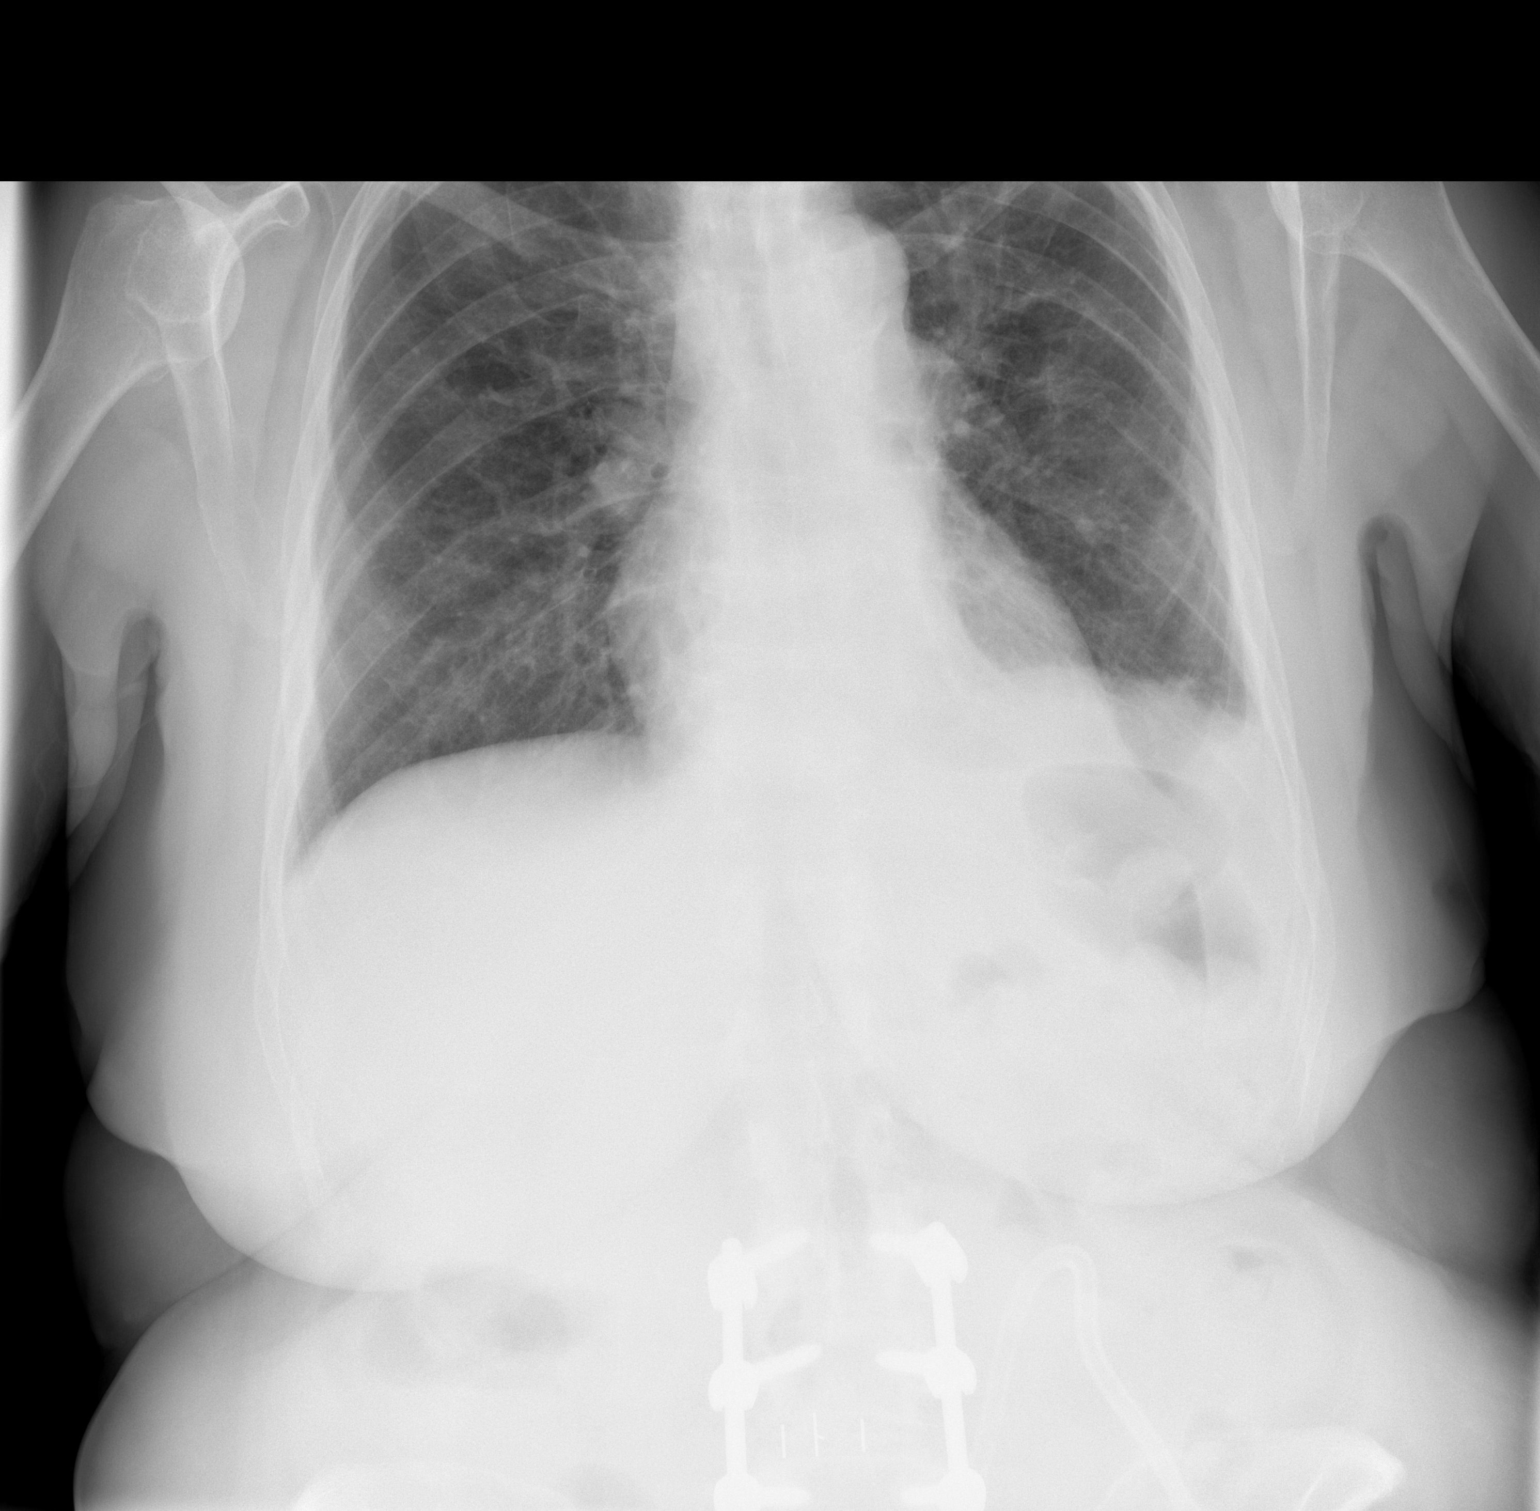

[w chest lat]
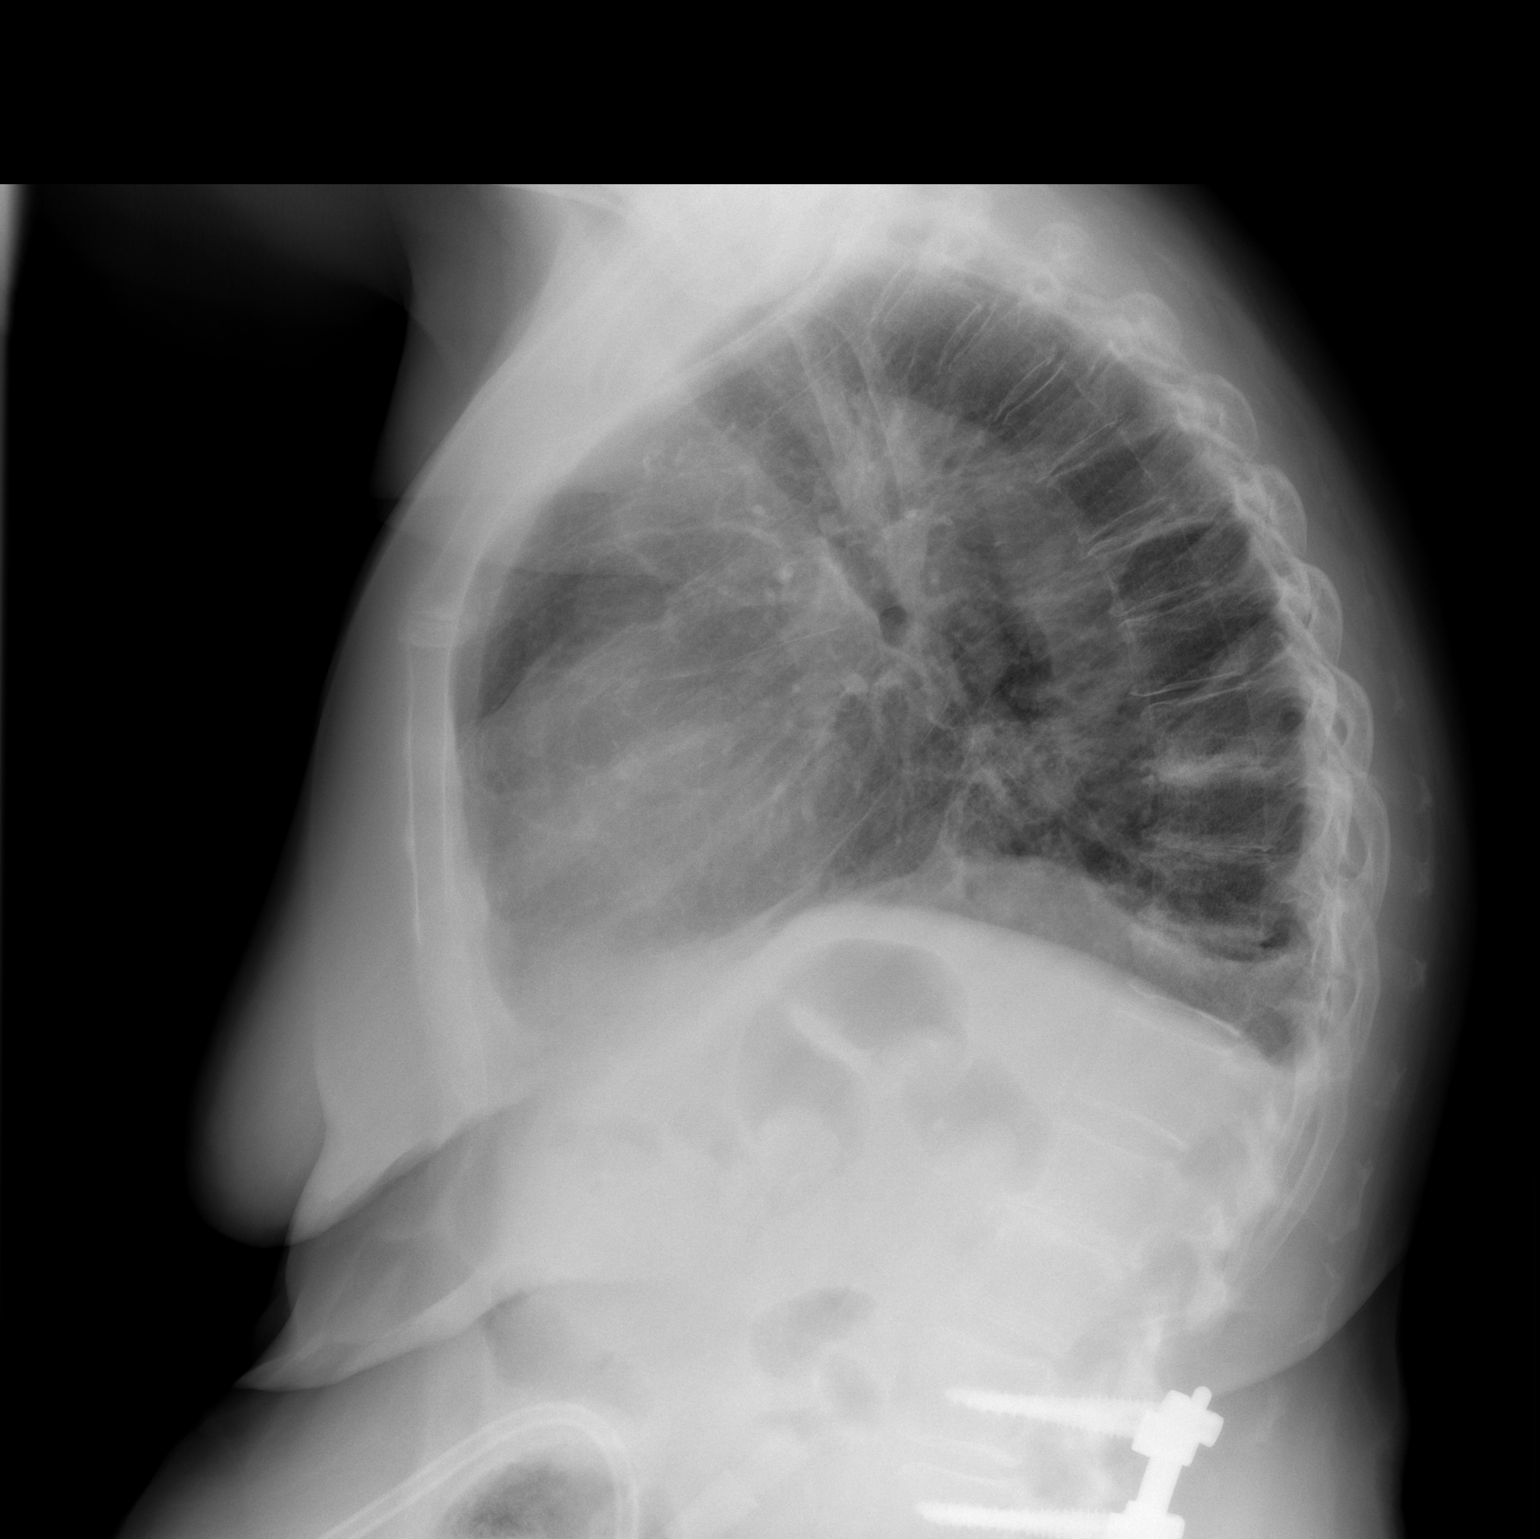

[2 of 2 positions shown; findings below may reference images not displayed]

FINDINGS: Increased interstitial markings. No focal consolidation. Patchy left
lower lobe opacity, likely atelectasis. Suspected small left pleural
effusion.

Heart is normal in size.

Degenerative changes of the visualized thoracolumbar spine. Lumbar
spine fixation hardware.
IMPRESSION: Patchy left lower lobe opacity, likely atelectasis. Suspected small
left pleural effusion.

## 2015-09-09 DIAGNOSIS — C160 Malignant neoplasm of cardia: Secondary | ICD-10-CM | POA: Diagnosis not present

## 2015-09-21 ENCOUNTER — Other Ambulatory Visit: Payer: Self-pay | Admitting: Physician Assistant

## 2015-10-06 ENCOUNTER — Ambulatory Visit (INDEPENDENT_AMBULATORY_CARE_PROVIDER_SITE_OTHER): Payer: Commercial Managed Care - PPO | Admitting: Cardiothoracic Surgery

## 2015-10-06 ENCOUNTER — Encounter: Payer: Self-pay | Admitting: Cardiothoracic Surgery

## 2015-10-06 VITALS — BP 126/67 | HR 72 | Resp 20 | Ht 61.0 in | Wt 166.0 lb

## 2015-10-06 DIAGNOSIS — C159 Malignant neoplasm of esophagus, unspecified: Secondary | ICD-10-CM | POA: Diagnosis not present

## 2015-10-06 DIAGNOSIS — Z5189 Encounter for other specified aftercare: Secondary | ICD-10-CM

## 2015-10-06 DIAGNOSIS — C155 Malignant neoplasm of lower third of esophagus: Secondary | ICD-10-CM

## 2015-10-06 DIAGNOSIS — Z9221 Personal history of antineoplastic chemotherapy: Secondary | ICD-10-CM

## 2015-10-06 DIAGNOSIS — Z923 Personal history of irradiation: Secondary | ICD-10-CM

## 2015-10-06 NOTE — Progress Notes (Signed)
[address]       [place] [address]             [place] Record [id number] Date of Birth: 08/30/1946  Referring: Marice Potter, MD Primary Care: Lillard Anes, MD  Chief Complaint:    Chief Complaint  Patient presents with  . Routine Post Op    6 week f/u, Chest/ABD CT 09/08/15- Dr Bobby Rumpf   05/10/2015 DATE OF DISCHARGE:  OPERATIVE REPORT PREOPERATIVE DIAGNOSIS: Adenocarcinoma of the distal esophagus. POSTOPERATIVE DIAGNOSIS: Adenocarcinoma of the distal esophagus. PROCEDURE: Video bronchoscopy, transhiatal total esophagectomy with cervical esophagogastrostomy, pyloroplasty, feeding jejunostomy. SURGEON: Lanelle Bal, MD.  Esophageal cancer   Staging form: Esophagus - Adenocarcinoma, AJCC 7th Edition     Pathologic stage from 05/13/2015: Stage IIB (T3, N0, cM0, G3 - Poorly differentiated) - Signed by Grace Isaac, MD on 05/16/2015       History of Present Illness:    Sharon Price 69 y.o. female is seen in the office in follow-up after resection of her adenocarcinoma the distal esophagus. She underwent surgery on June 12, did develop a cervical anastomotic leak which gradually healed and has now closed. She is now at home on jejunal tube feedings at night and soft diet during the day. Since discharge she's had no fever chills and appears to be progressing well. Recently she has been gaining weight.   Originally she was diagnosed with clinical stage IIIa T3 N1 MO esophageal cancer for an ulcerated adenocarcinoma [place] [facility] [phone]. The patient has completed neoadjuvant chemotherapy radiation with weekly carboplatin and and paclltaxel in AUG 2015.   Patient is no longer smoking.  Patient continues on anticoagulation for history of pulmonary embolus in the fall of 2015  Patient describes some bile reflux early in the morning when she first gets up.  Eating  well since stopping tube feeding, she notes some difficulty with bread and meats but otherwise is taking up solid diet without trouble.  Current Activity/ Functional Status:  Patient is independent with mobility/ambulation, transfers, ADL's, IADL's.   Zubrod Score: At the time of surgery this patient's most appropriate activity status/level should be described as: []     0    Normal activity, no symptoms [x]     1    Restricted in physical strenuous activity but ambulatory, able to do out light work []     2    Ambulatory and capable of self care, unable to do work activities, up and about               >50 % of waking hours                              []     3    Only limited self care, in bed greater than 50% of waking hours []     4    Completely disabled, no self care, confined to bed or chair []     5    Moribund   Past Medical History  Diagnosis Date  . Chronic kidney disease     renal..STAGE 2  . Hypertension   . Mechanical dysphagia     MOSTLY SOLIDS  . Pulmonary emboli ([place]) 9/223/15 [place]    RIGHT LOWER LOBE PER CT  CHEST   . Patient on combined chemotherapy and radiation     [facility]  . Cancer ([place]) 03/30/14    GE JUNCTION  . Transfusion history     ?[facility].  . Family history of adverse reaction to anesthesia     Patients sister is very nauseated after anesthesia  . Pneumonia   . Diabetes mellitus without complication (HCC)     Type 2  . Hyperlipemia   . GERD (gastroesophageal reflux disease)     Past Surgical History  Procedure Laterality Date  . Back surgery    . Abdominal hysterectomy    . Tubal ligation    . Eus N/A 04/08/2014    Procedure: ESOPHAGEAL ENDOSCOPIC ULTRASOUND (EUS) RADIAL;  Surgeon: Milus Banister, MD;  Location: [facility];  Service: Endoscopy;  Laterality: N/A;  . Appendectomy    . Spine surgery    . Lumbar fusion    . Esophagogastroduodenoscopy (egd) with propofol N/A  12/02/2014    Procedure: ESOPHAGOGASTRODUODENOSCOPY (EGD) WITH PROPOFOL;  Surgeon: Jerene Bears, MD;  Location: [facility];  Service: Endoscopy;  Laterality: N/A;  . Eus N/A 03/31/2015    Procedure: UPPER ENDOSCOPIC ULTRASOUND (EUS) RADIAL;  Surgeon: Milus Banister, MD;  Location: [facility];  Service: Endoscopy;  Laterality: N/A;  . Cataract extraction Bilateral   . Video bronchoscopy N/A 05/10/2015    Procedure: VIDEO BRONCHOSCOPY;  Surgeon: Grace Isaac, MD;  Location: [facility];  Service: Thoracic;  Laterality: N/A;  . Complete esophagectomy N/A 05/10/2015    Procedure: TRANSHIATAL TOTAL ESOPHAGEAL RESECTION;  Surgeon: Grace Isaac, MD;  Location: [place];  Service: Thoracic;  Laterality: N/A;  . Jejunostomy N/A 05/10/2015    Procedure: FEEDING JEJUNOSTOMY;  Surgeon: Grace Isaac, MD;  Location: [place];  Service: Thoracic;  Laterality: N/A;  . Mass excision Left 05/20/2015    Procedure: EXPLORATION OF LEFT NECK;  Surgeon: Grace Isaac, MD;  Location: [place];  Service: Thoracic;  Laterality: Left;    Family History  Problem Relation Age of Onset  . Diabetes Mother   . Hypertension Mother   . Stroke Mother   . Heart disease Mother   . Hypertension Father   . Diabetes Sister   . Cancer Sister     THYROID  . Cancer Brother     LUNG  . Diabetes Daughter   . Diabetes Son     Social History   Social History  . Marital Status: Widowed    Spouse Name: N/A  . Number of Children: N/A  . Years of Education: N/A   Occupational History  . Not on file.   Social History Main Topics  . Smoking status: Former Smoker -- 1.00 packs/day for 0 years    Quit date: 03/02/2015  . Smokeless tobacco: Not on file     Comment: 3 weeks- < 1ppd.  . Alcohol Use: No  . Drug Use: No  . Sexual Activity: Not on file   Other Topics Concern  . Not on file   Social History Narrative    History  Smoking status  . Former Smoker -- 1.00 packs/day for 0 years  . Quit date: 03/02/2015    Smokeless tobacco  . Not on file    Comment: 3 weeks- < 1ppd.    History  Alcohol Use No     No Known Allergies  Current Outpatient Prescriptions  Medication Sig Dispense Refill  . Albuterol Sulfate (PROAIR  RESPICLICK IN) Inhale 1 puff into the lungs as needed (for coughing/ shortness of breath).     Marland Kitchen amiodarone (PACERONE) 100 MG tablet Take 1 tablet (100 mg total) by mouth daily. 30 tablet 1  . chlorpheniramine-HYDROcodone (TUSSIONEX) 10-8 MG/5ML SUER Place 5 mLs into feeding tube every 12 (twelve) hours as needed for cough. 140 mL 0  . ELIQUIS 5 MG TABS tablet TAKE 1 TABLET BY MOUTH TWICE DAILY. 60 tablet 11  . ergocalciferol (VITAMIN D2) 50000 UNITS capsule Take 50,000 Units by mouth every 30 (thirty) days. Takes around the 1st or 2nd of month.    . Hydrocortisone (Manasvini Whatley'S BUTT CREAM) CREA Apply 1 application topically as needed for irritation.    . insulin glargine (LANTUS) 100 UNIT/ML injection Inject 0.1 mLs (10 Units total) into the skin at bedtime. (Patient taking differently: Inject 35 Units into the skin at bedtime. ) 10 mL 5  . lisinopril (PRINIVIL,ZESTRIL) 10 MG tablet Take 1 tablet (10 mg total) by mouth daily. 90 tablet 3  . metFORMIN (GLUCOPHAGE) 500 MG tablet Take 1 tablet (500 mg total) by mouth 2 (two) times daily with a meal.    . metoCLOPramide (REGLAN) 5 MG/5ML solution TAKE 10ML BY MOUTH EVERY 6 HOURS. 473 mL 0  . metoprolol tartrate (LOPRESSOR) 25 mg/10 mL SUSP Take 5 mLs (12.5 mg total) by mouth 2 (two) times daily. 300 mL 3  . oxyCODONE (ROXICODONE) 5 MG/5ML solution Take 5 mLs (5 mg total) by mouth every 4 (four) hours as needed for severe pain. 473 mL 0  . PAIN & FEVER CHILDRENS 160 MG/5ML solution TAKE 10.2ML BY MOUTH EVERY 4 HOURS AS NEEDED FOR MODERATE PAIN OR HEADACHE. 473 mL 0  . pantoprazole sodium (PROTONIX) 40 mg/20 mL PACK Take 20 mLs (40 mg total) by mouth 2 (two) times daily. 60 each 3  . promethazine (PHENERGAN) 12.5 MG tablet Take 1 tablet  (12.5 mg total) by mouth every 8 (eight) hours as needed for nausea, vomiting or refractory nausea / vomiting. 30 tablet 0  . sennosides (SENOKOT) 8.8 MG/5ML syrup Take 10 mLs by mouth 2 (two) times daily. 240 mL 5  . simvastatin (ZOCOR) 40 MG tablet Take 40 mg by mouth daily.     No current facility-administered medications for this visit.     Review of Systems:     Cardiac Review of Systems: Y or N  Chest Pain [  y  ]  Resting SOB [ n  ] Exertional SOB  [ y ]  Orthopnea [  ]   Pedal Edema [   ]    Palpitations [  ] Syncope  [  ]   Presyncope [   ]  General Review of Systems: [Y] = yes [  ]=no Constitional: recent weight change [ gained wt ];  Wt loss over the last 3 months [ ]  anorexia [  ]; fatigue [ y ]; nausea [ y ]; night sweats [  ]; fever [  ]; or chills [  ];          Dental: poor dentition[ n ]; Last Dentist visit:   Eye : blurred vision [  ]; diplopia [   ]; vision changes [  ];  Amaurosis fugax[  ]; Resp: cough [  ];  wheezing[  ];  hemoptysis[ n ]; shortness of breath[ n ]; paroxysmal nocturnal dyspnea[  ]; dyspnea on exertion[ y ]; or orthopnea[  ];  GI:  gallstones[  ], vomiting[n  ];  dysphagia[y  ]; melena[  ];  hematochezia [ y ]; heartburn[ y ];   Hx of  Colonoscopy[  ]; GU: kidney stones [  ]; hematuria[  ];   dysuria [  ];  nocturia[  ];  history of     obstruction [  ]; urinary frequency [  ]             Skin: rash, swelling[  ];, hair loss[  ];  peripheral edema[  ];  or itching[  ]; Musculosketetal: myalgias[  ];  joint swelling[  ];  joint erythema[  ];  joint pain[  ];  back pain[  ];  Heme/Lymph: bruising[  ];  bleeding[n  ];  anemia[  ];  Neuro: TIA[  ];  headaches[  ];  stroke[  ];  vertigo[  ];  seizures[  ];   paresthesias[  ];  difficulty walking[ n ];  Psych:depression[  ]; anxiety[ y ];  Endocrine: diabetes[  ];  thyroid dysfunction[  ];  Immunizations: Flu up to date [ n ]; Pneumococcal up to date [facility]  ];  Other:  Physical Exam: BP 126/67 mmHg  Pulse  72  Resp 20  Ht 5\' 1"  (1.549 m)  Wt 166 lb (75.297 kg)  BMI 31.38 kg/m2  SpO2 94%   Wt Readings from Last 3 Encounters:  10/06/15 166 lb (75.297 kg)  08/29/15 167 lb (75.751 kg)  08/18/15 164 lb (74.39 kg)   weight up from 132  PHYSICAL EXAMINATION: BP 126/67 mmHg  Pulse 72  Resp 20  Ht 5\' 1"  (1.549 m)  Wt 166 lb (75.297 kg)  BMI 31.38 kg/m2  SpO2 94%   General appearance: alert, cooperative, appears older than stated age, cachectic and fatigued Neurologic: intact Heart: Appears to be in a regular rhythm today Lungs: diminished breath sounds bibasilar Abdomen: Abdomen mildly distended with intact G-tube in the left upper abdomen Extremities: extremities normal, atraumatic, no cyanosis or edema and Homans sign is negative, no sign of DVT Wound:Abdominal incision is well-healed , the left neck incision is also well-healed without evidence of drainage  I do not appreciate cervical or supraclavicular adenopathy  Her abdominal incision has one small area of granulation tissue treated with silver nitrate topicially , the left neck incisions healed without drainage on exam today, the jejunostomy tube site is healing with a very small area of residual  granulation tissue  I   Diagnostic Studies & Laboratory data:     Recent Radiology Findings: CT of the chest and abdomen done at [facility] was reviewed in the PACS system and discussed with the patient. This scan was done in November 2016  Dg Esophagus W/water Sol Cm  06/16/2015   CLINICAL DATA:  69 year old female with history of esophageal cancer status post esophagectomy gastric pull-through, complicated by anastomotic leak. Followup study.  EXAM: ESOPHOGRAM/BARIUM SWALLOW  TECHNIQUE: Single contrast examination was performed using  water-soluble.  FLUOROSCOPY TIME:  If the device does not provide the exposure index:  Fluoroscopy Time:  1 minutes and 6 seconds  Number of Acquired Images:  6 series  COMPARISON:  05/30/2015.   FINDINGS: Multiple swallows were observed, which again demonstrated postoperative anatomy of esophagectomy with gastric pull-through. The anastomosis immediately above the level of the aortic arch was more normal in appearance on today's examination. Specifically, no extravasation of contrast was noted at any point during today's study comment even during multiple repeated swallows.  IMPRESSION: 1. Expected postoperative appearance of esophagectomy and gastric  pull through, with no evidence of residual anastomotic leak.   Electronically Signed   By: Vinnie Langton M.D.   On: 06/16/2015 08:53   Nm Pet Image Restag (ps) Skull Base To Thigh  03/24/2015   CLINICAL DATA:  Subsequent treatment strategy for esophageal cancer.  EXAM: NUCLEAR MEDICINE PET SKULL BASE TO THIGH  TECHNIQUE: 7.4 mCi F-18 FDG was injected intravenously. Full-ring PET imaging was performed from the skull base to thigh after the radiotracer. CT data was obtained and used for attenuation correction and anatomic localization.  FASTING BLOOD GLUCOSE:  Value: 219 mg/dl  COMPARISON:  CTs of the neck, abdomen and pelvis 03/07/2015. Chest CT 11/29/2014.  FINDINGS: NECK  No hypermetabolic cervical lymph nodes are identified.There are no lesions of the pharyngeal mucosal space. There is low-level activity associated with the muscles of phonation, within physiologic limits.  CHEST  There are no hypermetabolic mediastinal, hilar or axillary lymph nodes. There is focal hypermetabolic activity at the gastroesophageal junction, extending over a short segment. This has an SUV max of 5.5. Underlying wall thickening in this region appears grossly stable without well-defined mass. There is no suspicious pulmonary activity or suspicious pulmonary nodule. Mild emphysema, basilar interstitial prominence and small pleural effusions are noted.  ABDOMEN/PELVIS  There is no hypermetabolic activity within the liver, adrenal glands, spleen or pancreas. There is no  hypermetabolic nodal activity. Pancreatic atrophy and aortoiliac atherosclerosis noted.  SKELETON  There is no hypermetabolic activity to suggest osseous metastatic disease. There are postsurgical changes within the lumbar spine.  IMPRESSION: 1. Nonspecific short segment hypermetabolic activity at the gastroesophageal junction may be related to treated tumor and/or radiation change. 2. No evidence of metastatic disease. There is no abnormal activity within the liver or adjacent lymph nodes.   Electronically Signed   By: Richardean Sale M.D.   On: 03/24/2015 10:20  I have independently reviewed the above radiology studies  and reviewed the findings with the patient.  Final Report  03/07/2015  CLINICAL DATA: Esophageal cancer. Chemotherapy and radiation therapy completed August 2015.  EXAM: CT ABDOMEN AND PELVIS WITH CONTRAST  TECHNIQUE: Multidetector CT imaging of the abdomen and pelvis was performed using the standard protocol following bolus administration of intravenous contrast.  CONTRAST: 100 mL Isovue  COMPARISON: PET-CT scan 04/21/2014, CT scan 11/16/2014  FINDINGS: Lower chest: Lung bases are clear.  Hepatobiliary: No focal hepatic lesion. The gallbladder is collapsed.  Pancreas: Pancreas is normal. No ductal dilatation. No pancreatic inflammation.  Spleen: Normal spleen  Adrenals/urinary tract: Mild nodularity of adrenal glands is unchanged. Kidneys are normal. The ureters and bladder normal.  Stomach/Bowel: There is thickening of the distal esophagus similar to prior. Stomach, small bowel, and colon are unremarkable.  Vascular/Lymphatic: Atherosclerotic calcification aorta. No periportal retroperitoneal lymphadenopathy. No gastrohepatic ligament lymphadenopathy. No pelvic adenopathy or inguinal adenopathy.  Reproductive: Post hysterectomy.  Musculoskeletal: No aggressive osseous lesion. Posterior lumbar fusion  Other: No peritoneal metastasis.  IMPRESSION: 1.  No evidence of esophageal cancer recurrence or metastasis in the abdomen pelvis. 2. Stable thickening distal esophagus likely relates radiation change. 3. Atherosclerotic calcification of the aorta.   Electronically Signed By: Suzy Bouchard M.D. On: 03/07/2015 16:43    CLINICAL DATA: Shortness of breath; history of esophageal malignancy, PEG tube placement, appendectomy, hysterectomy, and lumbar fusion.  EXAM: CT ANGIOGRAPHY CHEST  CT ABDOMEN AND PELVIS WITH CONTRAST  TECHNIQUE: Multidetector CT imaging of the chest was performed using the standard protocol during bolus administration of intravenous contrast. Multiplanar CT image  reconstructions and MIPs were obtained to evaluate the vascular anatomy. Multidetector CT imaging of the abdomen and pelvis was performed using the standard protocol during bolus administration of intravenous contrast.  CONTRAST: 100 cc of Isovue 370 intravenously. The patient also received oral contrast material through the PEG tube.  COMPARISON: Portable chest x-ray of today's date and PET-CT study of April 21, 2014 and CT scan of the chest and abdomen abdomen dated March 31, 2014  FINDINGS: CTA CHEST FINDINGS  There are small filling defects within peripheral pulmonary arterial branches to the right lower lobe. There are no filling defects elsewhere on the right nor on the left. The caliber of the thoracic aorta is normal. The cardiac chambers are normal in size. The RV LV ratio is approximately 1. There is no pericardial effusion. There is no lymphadenopathy. There is thickening of the wall of the lower 1/3 of the esophagus without visible discrete mass. There is no free mediastinal fluid or air.  At lung window settings there is no pulmonary parenchymal mass. There is increased density in the posterior medial aspect of the right costophrenic gutter which may reflect pneumonia or pulmonary infarction new since the previous studies.  There is no pleural effusion nor pulmonary parenchymal mass.  There are degenerative changes of the lower thoracic discs. There is no compression fracture. The sternum and observed ribs exhibit no acute abnormalities.  CT ABDOMEN and PELVIS FINDINGS  The liver, gallbladder, pancreas, spleen, right adrenal gland, and kidneys are normal. There is stable mild enlargement of the left adrenal gland. There is no bulky periaortic or pericaval lymphadenopathy. The abdominal aorta exhibits mural thrombus and calcified plaque, but no evidence of aneurysm. The stomach contains a PEG tube and is of partially distended with gas. The GE junction region remains prominent. No perigastric lymphadenopathy is demonstrated. The small bowel is normal. There is a moderate stool burden within the colon without evidence of obstruction. The rectum is mildly distended with gas and stool. The urinary bladder is unremarkable. The uterus is surgically absent. The patient has undergone previous posterior fusion at L3-4 and L4-5. The vertebral bodies are preserved in height. The bony pelvis is unremarkable.  Review of the MIP images confirms the above findings.  IMPRESSION: 1. There are small emboli within branches of the right lower lobe pulmonary artery posteriorly without evidence of significant right heart strain. There is small amount of adjacent presumed infarct versus pneumonia. 2. There is mild thickening of the wall of the distal third of the esophagus without evidence of a discrete mass or perforation. 3. There is no evidence of CHF nor pulmonary parenchymal masses. There is no pleural effusion. 4. No acute intra-abdominal abnormality is demonstrated. There are atherosclerotic changes of the abdominal aorta without evidence of dissection or aneurysm. There is stable mild enlargement of the left adrenal gland. 5. These results were called by telephone at the time of interpretation on 07/21/2014 at  12:59 pm to Dr. Isla Pence MD, who verbally acknowledged these results.   Electronically Signed By: David Martinique On: 07/21/2014 12:59   CLINICAL DATA: Initial treatment strategy for esophageal carcinoma.  EXAM: NUCLEAR MEDICINE PET SKULL BASE TO THIGH  TECHNIQUE: 11.0 mCi F-18 FDG was injected intravenously. Full-ring PET imaging was performed from the skull base to thigh after the radiotracer. CT data was obtained and used for attenuation correction and anatomic localization.  FASTING BLOOD GLUCOSE: Value: 73 mg/dl  COMPARISON: CT 03/31/2014  FINDINGS: NECK  No hypermetabolic lymph nodes in the  neck.  CHEST  There is a long segment of intense hypermetabolic activity associated with the esophagus. This extends from relate just superior to the carina through the GE junction with SUV max equal 8.5. There is a hypermetabolic mass at the esophageal junction extending to the gastric cardia with SUV max equals 17.3. No hypermetabolic mediastinal lymph nodes. No suspicious pulmonary nodules.  ABDOMEN/PELVIS  Hypermetabolic mass in the gastric cardia described above. There is no hypermetabolic gastro hepatic ligament lymph nodes. No abnormal metabolic activity within the liver.  No hypermetabolic abdominal pelvic lymph nodes  SKELETON  No focal hypermetabolic activity to suggest skeletal metastasis.  IMPRESSION: 1. Hypermetabolic mass in the gastric cardia and esophageal junction consists with primary esophageal / gastric carcinoma. 2. Long segment intense metabolic activity associated with the mid and distal esophagus. Differential includes esophageal carcinoma versus esophagitis. 3. No evidence of hypermetabolic mediastinal or gastrohepatic ligament lymph nodes. 4. No evidence of liver metastasis. 5. No distant metastasis   Electronically Signed By: Suzy Bouchard M.D. On: 04/21/2014 13:57    CLINICAL DATA: Difficulty swallowing and epigastric  pain. Esophageal disorder. Prior appendectomy and hysterectomy.  EXAM: CT CHEST AND ABDOMEN WITH CONTRAST  TECHNIQUE: Multidetector CT imaging of the chest and abdomen was performed following the standard protocol during bolus administration of intravenous contrast.  CONTRAST: 80 cc Isovue 370  COMPARISON: Abdominal pelvic CT 02/10/2014. Chest radiograph 04/07/2013. Chest CT 04/23/2008.  FINDINGS: CT CHEST FINDINGS  Lungs/Pleura: No nodules or airspace opacities. No pleural fluid.  Heart/Mediastinum: No supraclavicular adenopathy. Mildly age advanced aortic and branch vessel atherosclerosis. Normal heart size with lipomatous hypertrophy of the interatrial septum. Multivessel coronary artery atherosclerosis. No mediastinal or hilar adenopathy. Air contrast level in the thoracic esophagus on image 28 of series 2 and more superiorly on image 19/series 2.  CT ABDOMEN FINDINGS  Abdomen: Normal liver, spleen. Redemonstration of soft tissue fullness involving the gastroesophageal junction and proximal stomach. Example images 37-42 of series 2. Suspect fluid/gastric contents with soft tissue density in the gastric cardia/body junction on image 39/series 2.  Normal pancreas, gallbladder, biliary tract. Mild right adrenal thickening and left adrenal nodularity are grossly similar back to 2009. Suspect an underlying left adrenal adenoma at 1.3 cm. 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 Normal right kidney. Advanced aortic and branch vessel atherosclerosis. Ulcerative plaque within the infrarenal aorta, including on image 62/series 2. Beam hardening artifact from spinal hardware. No retroperitoneal or retrocrural adenopathy. Normal colon and terminal ileum. Normal abdominal small bowel without ascites. No evidence of omental or peritoneal disease.  Bones/Musculoskeletal: L3-5 lumbar spine fixation. Lower thoracic degenerative disc disease.  IMPRESSION: CT  CHEST IMPRESSION  1. No acute process in the chest. 2. Age advanced coronary artery atherosclerosis. Recommend assessment of coronary risk factors and consideration of medical therapy. 3. Mildly dilated thoracic esophagus with contrast within. This suggests a component of esophageal obstruction, dysmotility, or gastroesophageal reflux disease.  CT ABDOMEN AND PELVIS IMPRESSION  1. Redemonstration of soft tissue fullness at the distal esophagus and proximal stomach. Cannot exclude gastritis or gastroesophageal carcinoma. If not already performed, endoscopy is recommended. 2. Advanced abdominal aortic and branch vessel atherosclerosis.   Electronically Signed By: Abigail Miyamoto M.D. On: 03/31/2014 14:14   Pretreatment EUS: Endoscopic findings: 1. Malignant mass in distal esophagus. The mass was circumferential, patially obstructing but I was able to fairly easily advance echoendoscope through the strictured lumen. The proximal edge was 34 cm from the incisors and distal edge (just below the GE  junction) was at 39 cm. The mass is 5cm long. EUS findings: 1. The mass above corresponded with a hypoechoic, heterogeneous mass that clearly passes into and through the muscularis propria layer of the esophageal wall (uT3). 2. There were two round, well demarcated, 5-36mm, hypoechoic paraesophageal lymphnodes that lay directly adjacent to the mass that are suspicious for malignant involvement (uN1). 3. No celiac adenopathy. Impression: uT3N1 (clinical stage IIIa) 5cm long, cirumferential GE junction adenocarcinoma with proximal edge at 34cm from incisors and distal edge just below the GE junction.   Post Treatment EUS: Endoscopic findings: 1. The previously noted (2015) GE junction malignancy was smaller now, but still clearly present. It was 2-3cm long, non-circumferential, ulcerated, located at GE junction (37cm from incisors). EUS findings: 1. The mass above correlated with a  hypoechoic, heterogeneous lesion that clearly passed into and through the muscularis propria layer of the distal esophagus, GE junction wall (uT3). 2. The was no paraesophageal, mediastinal, celiac adenopathy (uN0). ENDOSCOPIC IMPRESSION: uT3N0 (stage IIa) non-circumferential, 2-3cm long, ulcerated GE junction adenocarcinoma. This appears to have responded to neoadjuvant chemo (was previously staged IIIa) and so she may be a candidate for surgical resection   Recent Lab Findings: Lab Results  Component Value Date   WBC 12.3* 06/16/2015      Assessment / Plan:   Patient status post transhiatal total esophagectomy with cervical esophagogastrostomy, postoperatively complicated by anastomotic leak now healed Final pathologic stage IIb.  Follow-up CT scan shows no evidence of recurrence With her history of postoperative leak will have a low threshold to repeat. Swallow and/or perform esophageal dilatation if she has any swallowing difficulty.  We'll plan to see her back in March after follow-up CT scan is done in early March, scan his already been ordered by Dr. Nona Dell MD      [address] [place] [address] Office [phone]   Beeper [id number]  10/06/2015 11:52 AM

## 2015-10-29 ENCOUNTER — Other Ambulatory Visit: Payer: Self-pay | Admitting: Physician Assistant

## 2015-11-23 ENCOUNTER — Other Ambulatory Visit: Payer: Self-pay | Admitting: *Deleted

## 2015-11-23 DIAGNOSIS — R1310 Dysphagia, unspecified: Secondary | ICD-10-CM

## 2015-11-25 ENCOUNTER — Ambulatory Visit (HOSPITAL_COMMUNITY)
Admission: RE | Admit: 2015-11-25 | Discharge: 2015-11-25 | Disposition: A | Discharge status: Home / Self Care | Payer: Commercial Managed Care - PPO | Source: Ambulatory Visit | Attending: Cardiothoracic Surgery | Admitting: Cardiothoracic Surgery

## 2015-11-25 DIAGNOSIS — R1310 Dysphagia, unspecified: Secondary | ICD-10-CM | POA: Diagnosis not present

## 2015-11-25 MED ORDER — IOHEXOL 300 MG/ML  SOLN
300.0000 mL | Freq: Once | INTRAMUSCULAR | Status: AC | PRN
  Administered 2015-11-25: 150 mL via ORAL

## 2016-01-06 DIAGNOSIS — C160 Malignant neoplasm of cardia: Secondary | ICD-10-CM | POA: Diagnosis not present

## 2016-01-12 ENCOUNTER — Encounter: Payer: Medicare Other | Admitting: Cardiothoracic Surgery

## 2016-01-19 ENCOUNTER — Ambulatory Visit (INDEPENDENT_AMBULATORY_CARE_PROVIDER_SITE_OTHER): Payer: Commercial Managed Care - PPO | Admitting: Cardiothoracic Surgery

## 2016-01-19 ENCOUNTER — Encounter: Payer: Self-pay | Admitting: Cardiothoracic Surgery

## 2016-01-19 VITALS — BP 160/80 | HR 84 | Resp 20 | Ht 61.0 in | Wt 166.0 lb

## 2016-01-19 DIAGNOSIS — Z5189 Encounter for other specified aftercare: Secondary | ICD-10-CM | POA: Diagnosis not present

## 2016-01-19 DIAGNOSIS — Z9221 Personal history of antineoplastic chemotherapy: Secondary | ICD-10-CM

## 2016-01-19 DIAGNOSIS — C159 Malignant neoplasm of esophagus, unspecified: Secondary | ICD-10-CM

## 2016-01-19 DIAGNOSIS — C155 Malignant neoplasm of lower third of esophagus: Secondary | ICD-10-CM

## 2016-01-19 DIAGNOSIS — Z923 Personal history of irradiation: Secondary | ICD-10-CM

## 2016-01-19 NOTE — Progress Notes (Signed)
[address]       [place] [address]             [place] Record [id number] Date of Birth: 1946/02/08  Referring: Marice Potter, MD Primary Care: Lillard Anes, MD  Chief Complaint:    Chief Complaint  Patient presents with  . Routine Post Op    3 month f/u review CT Chest/Abd 01/05/16   05/10/2015 DATE OF DISCHARGE:  OPERATIVE REPORT PREOPERATIVE DIAGNOSIS: Adenocarcinoma of the distal esophagus. POSTOPERATIVE DIAGNOSIS: Adenocarcinoma of the distal esophagus. PROCEDURE: Video bronchoscopy, transhiatal total esophagectomy with cervical esophagogastrostomy, pyloroplasty, feeding jejunostomy. SURGEON: Lanelle Bal, MD.  Esophageal cancer   Staging form: Esophagus - Adenocarcinoma, AJCC 7th Edition     Pathologic stage from 05/13/2015: Stage IIB (T3, N0, cM0, G3 - Poorly differentiated) - Signed by Grace Isaac, MD on 05/16/2015       History of Present Illness:    Sharon Price 70 y.o. female is seen in the office in follow-up after resection of her adenocarcinoma the distal esophagus. She underwent surgery on June 12, did develop a cervical anastomotic leak which gradually healed and has now closed. She is now at home on jejunal tube feedings at night and soft diet during the day. Since discharge she's had no fever chills and appears to be progressing well. Recently she has been gaining weight.   Originally she was diagnosed with clinical stage IIIa T3 N1 MO esophageal cancer for an ulcerated adenocarcinoma [place] [facility] [phone] 15. The patient has completed neoadjuvant chemotherapy radiation with weekly carboplatin and and paclltaxel in AUG 2015.   Patient is no longer smoking.  Patient continues on anticoagulation for history of pulmonary embolus in the fall of 2015    Eating well since stopping tube feeding, She notes she ate a bologna and cheese sandwich  yesterday.  She has required esophageal dilatation for anastomotic stricture, this is been done twice in [place] she scheduled again in a month for follow-up dilatation. She notes significant improvement with this  Current Activity/ Functional Status:  Patient is independent with mobility/ambulation, transfers, ADL's, IADL's.   Zubrod Score: At the time of surgery this patient's most appropriate activity status/level should be described as: []     0    Normal activity, no symptoms [x]     1    Restricted in physical strenuous activity but ambulatory, able to do out light work []     2    Ambulatory and capable of self care, unable to do work activities, up and about               >50 % of waking hours                              []     3    Only limited self care, in bed greater than 50% of waking hours []     4    Completely disabled, no self care, confined to bed or chair []     5    Moribund   Past Medical History  Diagnosis Date  . Chronic kidney disease     renal..STAGE 2  . Hypertension   . Mechanical dysphagia     MOSTLY SOLIDS  . Pulmonary emboli ([place]) 9/223/15  [place]    RIGHT LOWER LOBE PER CT CHEST   . Patient on combined chemotherapy and radiation     [facility]  . Cancer ([place]) 03/30/14    GE JUNCTION  . Transfusion history     ?[facility].  . Family history of adverse reaction to anesthesia     Patients sister is very nauseated after anesthesia  . Pneumonia   . Diabetes mellitus without complication (HCC)     Type 2  . Hyperlipemia   . GERD (gastroesophageal reflux disease)     Past Surgical History  Procedure Laterality Date  . Back surgery    . Abdominal hysterectomy    . Tubal ligation    . Eus N/A 04/08/2014    Procedure: ESOPHAGEAL ENDOSCOPIC ULTRASOUND (EUS) RADIAL;  Surgeon: Milus Banister, MD;  Location: [facility];  Service: Endoscopy;  Laterality: N/A;  . Appendectomy    . Spine surgery     . Lumbar fusion    . Esophagogastroduodenoscopy (egd) with propofol N/A 12/02/2014    Procedure: ESOPHAGOGASTRODUODENOSCOPY (EGD) WITH PROPOFOL;  Surgeon: Jerene Bears, MD;  Location: [facility];  Service: Endoscopy;  Laterality: N/A;  . Eus N/A 03/31/2015    Procedure: UPPER ENDOSCOPIC ULTRASOUND (EUS) RADIAL;  Surgeon: Milus Banister, MD;  Location: [facility];  Service: Endoscopy;  Laterality: N/A;  . Cataract extraction Bilateral   . Video bronchoscopy N/A 05/10/2015    Procedure: VIDEO BRONCHOSCOPY;  Surgeon: Grace Isaac, MD;  Location: [facility];  Service: Thoracic;  Laterality: N/A;  . Complete esophagectomy N/A 05/10/2015    Procedure: TRANSHIATAL TOTAL ESOPHAGEAL RESECTION;  Surgeon: Grace Isaac, MD;  Location: [facility];  Service: Thoracic;  Laterality: N/A;  . Jejunostomy N/A 05/10/2015    Procedure: FEEDING JEJUNOSTOMY;  Surgeon: Grace Isaac, MD;  Location: [facility];  Service: Thoracic;  Laterality: N/A;  . Mass excision Left 05/20/2015    Procedure: EXPLORATION OF LEFT NECK;  Surgeon: Grace Isaac, MD;  Location: [facility];  Service: Thoracic;  Laterality: Left;    Family History  Problem Relation Age of Onset  . Diabetes Mother   . Hypertension Mother   . Stroke Mother   . Heart disease Mother   . Hypertension Father   . Diabetes Sister   . Cancer Sister     THYROID  . Cancer Brother     LUNG  . Diabetes Daughter   . Diabetes Son     Social History   Social History  . Marital Status: Widowed    Spouse Name: N/A  . Number of Children: N/A  . Years of Education: N/A   Occupational History  . Not on file.   Social History Main Topics  . Smoking status: Former Smoker -- 1.00 packs/day for 0 years    Quit date: 03/02/2015  . Smokeless tobacco: Not on file     Comment: 3 weeks- < 1ppd.  . Alcohol Use: No  . Drug Use: No  . Sexual Activity: Not on file   Other Topics Concern  . Not on file   Social History Narrative    History  Smoking status    . Former Smoker -- 1.00 packs/day for 0 years  . Quit date: 03/02/2015  Smokeless tobacco  . Not on file    Comment: 3 weeks- < 1ppd.    History  Alcohol Use No     No Known Allergies  Current Outpatient Prescriptions  Medication Sig Dispense Refill  . amiodarone (PACERONE) 100 MG tablet Take 1 tablet (100 mg total) by mouth daily. 30 tablet 1  . ELIQUIS 5 MG TABS tablet TAKE 1 TABLET BY MOUTH TWICE DAILY. 60 tablet 11  . ergocalciferol (VITAMIN D2) 50000 UNITS capsule Take 50,000 Units by mouth every 30 (thirty) days. Takes around the 1st or 2nd of month.    . insulin glargine (LANTUS) 100 UNIT/ML injection Inject 0.1 mLs (10 Units total) into the skin at bedtime. (Patient taking differently: Inject 35 Units into the skin at bedtime. ) 10 mL 5  . lisinopril (PRINIVIL,ZESTRIL) 10 MG tablet Take 1 tablet (10 mg total) by mouth daily. 90 tablet 3  . metFORMIN (GLUCOPHAGE) 500 MG tablet Take 1 tablet (500 mg total) by mouth 2 (two) times daily with a meal.    . metoCLOPramide (REGLAN) 5 MG/5ML solution TAKE 10ML BY MOUTH EVERY 6 HOURS. 473 mL 0  . metoprolol tartrate (LOPRESSOR) 25 mg/10 mL SUSP Take 5 mLs (12.5 mg total) by mouth 2 (two) times daily. 300 mL 3  . oxyCODONE (ROXICODONE) 5 MG/5ML solution Take 5 mLs (5 mg total) by mouth every 4 (four) hours as needed for severe pain. 473 mL 0  . PAIN & FEVER CHILDRENS 160 MG/5ML solution TAKE 10.2ML BY MOUTH EVERY 4 HOURS AS NEEDED FOR MODERATE PAIN OR HEADACHE. 473 mL 0  . pantoprazole sodium (PROTONIX) 40 mg/20 mL PACK Take 20 mLs (40 mg total) by mouth 2 (two) times daily. 60 each 3  . promethazine (PHENERGAN) 12.5 MG tablet Take 1 tablet (12.5 mg total) by mouth every 8 (eight) hours as needed for nausea, vomiting or refractory nausea / vomiting. 30 tablet 0  . sennosides (SENOKOT) 8.8 MG/5ML syrup Take 10 mLs by mouth 2 (two) times daily. 240 mL 5  . simvastatin (ZOCOR) 40 MG tablet Take 40 mg by mouth daily.     No current  facility-administered medications for this visit.     Review of Systems:     Cardiac Review of Systems: Y or N  Chest Pain [  y  ]  Resting SOB [ n  ] Exertional SOB  [ y ]  Orthopnea [  ]   Pedal Edema [   ]    Palpitations [  ] Syncope  [  ]   Presyncope [   ]  General Review of Systems: [Y] = yes [  ]=no Constitional: recent weight change [ gained wt ];  Wt loss over the last 3 months [ ]  anorexia [  ]; fatigue [ y ]; nausea [ y ]; night sweats [  ]; fever [  ]; or chills [  ];          Dental: poor dentition[ n ]; Last Dentist visit:   Eye : blurred vision [  ]; diplopia [   ]; vision changes [  ];  Amaurosis fugax[  ]; Resp: cough [  ];  wheezing[  ];  hemoptysis[ n ]; shortness of breath[ n ]; paroxysmal nocturnal dyspnea[  ]; dyspnea on exertion[ y ]; or orthopnea[  ];  GI:  gallstones[  ], vomiting[n  ];  dysphagia[y  ]; melena[  ];  hematochezia [ y ]; heartburn[ y ];   Hx of  Colonoscopy[  ]; GU: kidney stones [  ]; hematuria[  ];   dysuria [  ];  nocturia[  ];  history of     obstruction [  ]; urinary  frequency [  ]             Skin: rash, swelling[  ];, hair loss[  ];  peripheral edema[  ];  or itching[  ]; Musculosketetal: myalgias[  ];  joint swelling[  ];  joint erythema[  ];  joint pain[  ];  back pain[  ];  Heme/Lymph: bruising[  ];  bleeding[n  ];  anemia[  ];  Neuro: TIA[  ];  headaches[  ];  stroke[  ];  vertigo[  ];  seizures[  ];   paresthesias[  ];  difficulty walking[ n ];  Psych:depression[  ]; anxiety[ y ];  Endocrine: diabetes[  ];  thyroid dysfunction[  ];  Immunizations: Flu up to date [ n ]; Pneumococcal up to date [facility]  ];  Other:  Physical Exam: BP 160/80 mmHg  Pulse 84  Resp 20  Ht 5\' 1"  (1.549 m)  Wt 166 lb (75.297 kg)  BMI 31.38 kg/m2  SpO2 97%   Wt Readings from Last 3 Encounters:  01/19/16 166 lb (75.297 kg)  10/06/15 166 lb (75.297 kg)  08/29/15 167 lb (75.751 kg)   weight up from 132  PHYSICAL EXAMINATION: BP 160/80 mmHg  Pulse 84  Resp  20  Ht 5\' 1"  (1.549 m)  Wt 166 lb (75.297 kg)  BMI 31.38 kg/m2  SpO2 97%   General appearance: alert, cooperative, appears older than stated age, cachectic and fatigued Neurologic: intact Heart: Appears to be in a regular rhythm today Lungs: diminished breath sounds bibasilar Abdomen: Abdomen mildly distended with intact G-tube in the left upper abdomen Extremities: extremities normal, atraumatic, no cyanosis or edema and Homans sign is negative, no sign of DVT Wound:Abdominal incision is well-healed , the left neck incision is also well-healed without evidence of drainage  I do not appreciate cervical or supraclavicular adenopathy  Her abdominal incision has one small area of granulation tissue treated with silver nitrate topicially , the left neck incisions healed without drainage on exam today, the jejunostomy tube site is healing with a very small area of residual  granulation tissue  I   Diagnostic Studies & Laboratory data: No evidence of recurrence     Recent Radiology Findings: CT of the chest and abdomen done at [facility] was reviewed in the PACS system and discussed with the patient. This scan was done in 01/05/2016-     Pretreatment EUS: Endoscopic findings: 1. Malignant mass in distal esophagus. The mass was circumferential, patially obstructing but I was able to fairly easily advance echoendoscope through the strictured lumen. The proximal edge was 34 cm from the incisors and distal edge (just below the GE junction) was at 39 cm. The mass is 5cm long. EUS findings: 1. The mass above corresponded with a hypoechoic, heterogeneous mass that clearly passes into and through the muscularis propria layer of the esophageal wall (uT3). 2. There were two round, well demarcated, 5-67mm, hypoechoic paraesophageal lymphnodes that lay directly adjacent to the mass that are suspicious for malignant involvement (uN1). 3. No celiac adenopathy. Impression: uT3N1 (clinical stage  IIIa) 5cm long, cirumferential GE junction adenocarcinoma with proximal edge at 34cm from incisors and distal edge just below the GE junction.   Post Treatment EUS: Endoscopic findings: 1. The previously noted (2015) GE junction malignancy was smaller now, but still clearly present. It was 2-3cm long, non-circumferential, ulcerated, located at GE junction (37cm from incisors). EUS findings: 1. The mass above correlated with a hypoechoic, heterogeneous lesion that clearly passed into  and through the muscularis propria layer of the distal esophagus, GE junction wall (uT3). 2. The was no paraesophageal, mediastinal, celiac adenopathy (uN0). ENDOSCOPIC IMPRESSION: uT3N0 (stage IIa) non-circumferential, 2-3cm long, ulcerated GE junction adenocarcinoma. This appears to have responded to neoadjuvant chemo (was previously staged IIIa) and so she may be a candidate for surgical resection   Recent Lab Findings: Lab Results  Component Value Date   WBC 12.3* 06/16/2015      Assessment / Plan:   Patient status post transhiatal total esophagectomy with cervical esophagogastrostomy, postoperatively complicated by anastomotic leak now healed Final pathologic stage IIb.  Follow-up CT scan shows no evidence of recurrence Done March 2017  [facility] plan to see her back in in 27 months    Grace Isaac MD      [address] [place] [address] Office [phone]   Beeper [id number]  01/19/2016 10:42 AM

## 2016-04-16 ENCOUNTER — Other Ambulatory Visit: Payer: Self-pay | Admitting: Cardiovascular Disease

## 2016-04-17 ENCOUNTER — Other Ambulatory Visit: Payer: Self-pay

## 2016-04-17 ENCOUNTER — Telehealth: Payer: Self-pay | Admitting: Cardiovascular Disease

## 2016-04-17 MED ORDER — METOPROLOL TARTRATE 25 MG/10 ML ORAL SUSPENSION: 12.5000 mg | Freq: Two times a day (BID) | ORAL | Status: DC

## 2016-04-17 NOTE — Telephone Encounter (Signed)
New message    The pharmacist said it was a deny on the pt's refill it stated not appropriate

## 2016-04-17 NOTE — Telephone Encounter (Signed)
Follow-up    Pt c/o medication issue:  1. Name of Medication: Metoprolol  2. How are you currently taking this medication (dosage and times per day)? Provider did not provide info  3. Are you having a reaction (difficulty breathing--STAT)? no 4. What is your medication issue? Just to say it was denied

## 2016-04-17 NOTE — Telephone Encounter (Signed)
Referred pharmacist to Dr. Everrett Coombe office, since they last changed patient's metoprolol.

## 2016-04-18 ENCOUNTER — Telehealth: Payer: Self-pay | Admitting: *Deleted

## 2016-04-18 NOTE — Telephone Encounter (Signed)
metoprolol tartrate (LOPRESSOR) 25 mg/10 mL SUSP  Medication   Date: 04/17/2016  Department: [facility]  Ordering/Authorizing: Josue Hector, MD      Order Providers    Prescribing Provider Encounter Provider   Josue Hector, MD Stephannie Peters, CMA    Medication Detail      Disp Refills Start End     metoprolol tartrate (LOPRESSOR) 25 mg/10 mL SUSP 300 mL 1 04/17/2016     Sig - Route: Take 5 mLs (12.5 mg total) by mouth 2 (two) times daily. - Oral    Class: Print    Notes to Pharmacy: Pt needs to calland schedule appt.     Pharmacy    [facility] - [place] [place] - [address]  DID NOT GET SENT WILL RESEND TO DR [id number] OFFICE PER PHONE NOTE 04/17/16  IYANUOLUWA ROIGER  04/17/2016  Telephone  MRN:  [id number]   Description: 70 year old female  Provider: Josue Hector, MD  Department: [facility]       Call Documentation      Michaelyn Barter, RN at 04/17/2016 11:27 AM     Status: Signed       Expand All Collapse All   Referred pharmacist to Dr. Everrett Coombe office, since they last changed patient's metoprolol.            Christene Lye at 04/17/2016 11:27 AM     Status: Signed       Expand All Collapse All   Follow-up    Pt c/o medication issue:  1. Name of Medication: Metoprolol  2. How are you currently taking this medication (dosage and times per day)? Provider did not provide info  3. Are you having a reaction (difficulty breathing--STAT)? no 4. What is your medication issue? Just to say it was denied            Christene Lye at 04/17/2016 11:12 AM     Status: Signed       Expand All Collapse All   New message    The pharmacist said it was a deny on the pt's refill it stated not appropriate

## 2016-04-28 ENCOUNTER — Other Ambulatory Visit: Payer: Self-pay | Admitting: Cardiology

## 2016-05-02 NOTE — Telephone Encounter (Signed)
Left message for patient to call back. When patient calls back, will make a follow-up appointment.

## 2016-05-03 MED ORDER — METOPROLOL TARTRATE 25 MG/10 ML ORAL SUSPENSION: 12.5000 mg | Freq: Two times a day (BID) | ORAL | Status: DC

## 2016-05-04 ENCOUNTER — Other Ambulatory Visit: Payer: Self-pay | Admitting: *Deleted

## 2016-05-04 ENCOUNTER — Telehealth: Payer: Self-pay | Admitting: *Deleted

## 2016-05-04 MED ORDER — METOPROLOL TARTRATE 25 MG/10 ML ORAL SUSPENSION: 12.5000 mg | Freq: Two times a day (BID) | ORAL | Status: DC

## 2016-05-04 MED ORDER — METOPROLOL TARTRATE 25 MG PO TABS: 25.0000 mg | ORAL_TABLET | Freq: Two times a day (BID) | ORAL | Status: AC

## 2016-05-04 NOTE — Telephone Encounter (Signed)
Amy will contact Dr gerhardts office and try to get this medication filled for this pt.

## 2016-05-23 DIAGNOSIS — C155 Malignant neoplasm of lower third of esophagus: Secondary | ICD-10-CM | POA: Diagnosis not present

## 2016-05-23 DIAGNOSIS — R918 Other nonspecific abnormal finding of lung field: Secondary | ICD-10-CM | POA: Diagnosis not present

## 2016-05-23 DIAGNOSIS — Z8509 Personal history of malignant neoplasm of other digestive organs: Secondary | ICD-10-CM | POA: Diagnosis not present

## 2016-07-19 ENCOUNTER — Encounter: Payer: Medicare Other | Admitting: Cardiothoracic Surgery

## 2016-07-29 DEATH — deceased
